# Patient Record
Sex: Male | Born: 1940
Health system: Southern US, Community
[De-identification: ages and names within clinical notes are randomized; demographics above are authoritative.]

## PROBLEM LIST (undated history)

## (undated) DIAGNOSIS — I639 Cerebral infarction, unspecified: Secondary | ICD-10-CM

## (undated) DIAGNOSIS — I10 Essential (primary) hypertension: Secondary | ICD-10-CM

## (undated) DIAGNOSIS — H9190 Unspecified hearing loss, unspecified ear: Secondary | ICD-10-CM

## (undated) DIAGNOSIS — Z972 Presence of dental prosthetic device (complete) (partial): Secondary | ICD-10-CM

## (undated) DIAGNOSIS — I679 Cerebrovascular disease, unspecified: Secondary | ICD-10-CM

## (undated) DIAGNOSIS — IMO0001 Reserved for inherently not codable concepts without codable children: Secondary | ICD-10-CM

## (undated) DIAGNOSIS — R011 Cardiac murmur, unspecified: Secondary | ICD-10-CM

## (undated) DIAGNOSIS — E785 Hyperlipidemia, unspecified: Secondary | ICD-10-CM

## (undated) DIAGNOSIS — K4020 Bilateral inguinal hernia, without obstruction or gangrene, not specified as recurrent: Secondary | ICD-10-CM

## (undated) DIAGNOSIS — N442 Benign cyst of testis: Secondary | ICD-10-CM

## (undated) DIAGNOSIS — D333 Benign neoplasm of cranial nerves: Secondary | ICD-10-CM

## (undated) DIAGNOSIS — I350 Nonrheumatic aortic (valve) stenosis: Secondary | ICD-10-CM

## (undated) DIAGNOSIS — R350 Frequency of micturition: Secondary | ICD-10-CM

## (undated) DIAGNOSIS — C61 Malignant neoplasm of prostate: Secondary | ICD-10-CM

## (undated) DIAGNOSIS — K458 Other specified abdominal hernia without obstruction or gangrene: Secondary | ICD-10-CM

## (undated) DIAGNOSIS — I700 Atherosclerosis of aorta: Secondary | ICD-10-CM

## (undated) DIAGNOSIS — K56609 Unspecified intestinal obstruction, unspecified as to partial versus complete obstruction: Secondary | ICD-10-CM

## (undated) DIAGNOSIS — K5790 Diverticulosis of intestine, part unspecified, without perforation or abscess without bleeding: Secondary | ICD-10-CM

## (undated) DIAGNOSIS — Z974 Presence of external hearing-aid: Secondary | ICD-10-CM

## (undated) DIAGNOSIS — K432 Incisional hernia without obstruction or gangrene: Secondary | ICD-10-CM

## (undated) DIAGNOSIS — K4190 Unilateral femoral hernia, without obstruction or gangrene, not specified as recurrent: Secondary | ICD-10-CM

## (undated) DIAGNOSIS — I739 Peripheral vascular disease, unspecified: Secondary | ICD-10-CM

## (undated) DIAGNOSIS — Q631 Lobulated, fused and horseshoe kidney: Secondary | ICD-10-CM

## (undated) DIAGNOSIS — I454 Nonspecific intraventricular block: Secondary | ICD-10-CM

## (undated) DIAGNOSIS — C4491 Basal cell carcinoma of skin, unspecified: Secondary | ICD-10-CM

## (undated) DIAGNOSIS — IMO0002 Reserved for concepts with insufficient information to code with codable children: Secondary | ICD-10-CM

## (undated) DIAGNOSIS — Z973 Presence of spectacles and contact lenses: Secondary | ICD-10-CM

## (undated) HISTORY — PX: PROSTATE BIOPSY: SHX241

## (undated) HISTORY — PX: COLONOSCOPY: SHX174

---

## 1977-11-30 DIAGNOSIS — I639 Cerebral infarction, unspecified: Secondary | ICD-10-CM

## 1977-11-30 HISTORY — DX: Cerebral infarction, unspecified: I63.9

## 1996-11-30 HISTORY — PX: TESTICLE SURGERY: SHX794

## 1998-06-04 ENCOUNTER — Ambulatory Visit (HOSPITAL_COMMUNITY): Admission: RE | Admit: 1998-06-04 | Discharge: 1998-06-04 | Payer: Self-pay | Admitting: Urology

## 1999-01-29 HISTORY — PX: COLECTOMY WITH COLOSTOMY CREATION/HARTMANN PROCEDURE: SHX6598

## 1999-02-17 ENCOUNTER — Encounter: Payer: Self-pay | Admitting: Emergency Medicine

## 1999-02-17 ENCOUNTER — Encounter: Payer: Self-pay | Admitting: Internal Medicine

## 1999-02-17 ENCOUNTER — Inpatient Hospital Stay (HOSPITAL_COMMUNITY): Admission: EM | Admit: 1999-02-17 | Discharge: 1999-02-28 | Payer: Self-pay | Admitting: Emergency Medicine

## 1999-03-01 HISTORY — PX: SMALL BOWEL REPAIR: SHX6447

## 1999-03-08 ENCOUNTER — Encounter: Payer: Self-pay | Admitting: General Surgery

## 1999-03-08 ENCOUNTER — Inpatient Hospital Stay (HOSPITAL_COMMUNITY): Admission: EM | Admit: 1999-03-08 | Discharge: 1999-04-01 | Payer: Self-pay | Admitting: Emergency Medicine

## 1999-03-09 ENCOUNTER — Encounter: Payer: Self-pay | Admitting: General Surgery

## 1999-03-26 ENCOUNTER — Encounter: Payer: Self-pay | Admitting: General Surgery

## 1999-08-12 ENCOUNTER — Encounter: Payer: Self-pay | Admitting: General Surgery

## 1999-08-12 ENCOUNTER — Ambulatory Visit (HOSPITAL_COMMUNITY): Admission: RE | Admit: 1999-08-12 | Discharge: 1999-08-12 | Payer: Self-pay | Admitting: General Surgery

## 1999-08-25 ENCOUNTER — Encounter (INDEPENDENT_AMBULATORY_CARE_PROVIDER_SITE_OTHER): Payer: Self-pay

## 1999-08-25 ENCOUNTER — Inpatient Hospital Stay (HOSPITAL_COMMUNITY): Admission: RE | Admit: 1999-08-25 | Discharge: 1999-08-29 | Payer: Self-pay | Admitting: General Surgery

## 1999-08-31 HISTORY — PX: COLOSTOMY TAKEDOWN: SHX5783

## 1999-12-01 HISTORY — PX: INCISIONAL HERNIA REPAIR: SHX193

## 2000-01-02 ENCOUNTER — Encounter: Payer: Self-pay | Admitting: General Surgery

## 2000-01-06 ENCOUNTER — Inpatient Hospital Stay (HOSPITAL_COMMUNITY): Admission: RE | Admit: 2000-01-06 | Discharge: 2000-01-09 | Payer: Self-pay | Admitting: General Surgery

## 2000-04-27 ENCOUNTER — Encounter (INDEPENDENT_AMBULATORY_CARE_PROVIDER_SITE_OTHER): Payer: Self-pay | Admitting: *Deleted

## 2000-04-27 ENCOUNTER — Ambulatory Visit (HOSPITAL_COMMUNITY): Admission: RE | Admit: 2000-04-27 | Discharge: 2000-04-27 | Payer: Self-pay | Admitting: General Surgery

## 2001-09-14 ENCOUNTER — Other Ambulatory Visit: Admission: RE | Admit: 2001-09-14 | Discharge: 2001-09-14 | Payer: Self-pay | Admitting: Urology

## 2001-09-14 ENCOUNTER — Encounter (INDEPENDENT_AMBULATORY_CARE_PROVIDER_SITE_OTHER): Payer: Self-pay | Admitting: Specialist

## 2001-09-22 ENCOUNTER — Ambulatory Visit: Admission: RE | Admit: 2001-09-22 | Discharge: 2001-12-21 | Payer: Self-pay | Admitting: Radiation Oncology

## 2001-12-22 ENCOUNTER — Ambulatory Visit: Admission: RE | Admit: 2001-12-22 | Discharge: 2002-03-22 | Payer: Self-pay | Admitting: Radiation Oncology

## 2002-11-18 ENCOUNTER — Encounter: Payer: Self-pay | Admitting: Emergency Medicine

## 2002-11-19 ENCOUNTER — Inpatient Hospital Stay (HOSPITAL_COMMUNITY): Admission: EM | Admit: 2002-11-19 | Discharge: 2002-11-20 | Payer: Self-pay | Admitting: Emergency Medicine

## 2006-11-30 HISTORY — PX: LAPAROSCOPIC INCISIONAL / UMBILICAL / VENTRAL HERNIA REPAIR: SUR789

## 2007-06-28 ENCOUNTER — Encounter: Admission: RE | Admit: 2007-06-28 | Discharge: 2007-06-28 | Payer: Self-pay | Admitting: *Deleted

## 2007-09-02 ENCOUNTER — Ambulatory Visit (HOSPITAL_COMMUNITY): Admission: RE | Admit: 2007-09-02 | Discharge: 2007-09-03 | Payer: Self-pay | Admitting: Surgery

## 2009-11-30 HISTORY — PX: REMOVE SUTURE UNDER ANESTHESIA: SUR1139

## 2010-03-25 ENCOUNTER — Ambulatory Visit (HOSPITAL_COMMUNITY): Admission: RE | Admit: 2010-03-25 | Discharge: 2010-03-25 | Payer: Self-pay | Admitting: Surgery

## 2010-11-30 DIAGNOSIS — I350 Nonrheumatic aortic (valve) stenosis: Secondary | ICD-10-CM

## 2010-11-30 HISTORY — DX: Nonrheumatic aortic (valve) stenosis: I35.0

## 2011-02-17 LAB — TISSUE CULTURE: Culture: NO GROWTH

## 2011-02-17 LAB — CBC
HCT: 41.2 % (ref 39.0–52.0)
MCHC: 33.8 g/dL (ref 30.0–36.0)
MCV: 93.8 fL (ref 78.0–100.0)
Platelets: 213 10*3/uL (ref 150–400)
RDW: 13.3 % (ref 11.5–15.5)

## 2011-02-17 LAB — ANAEROBIC CULTURE

## 2011-04-14 NOTE — Op Note (Signed)
NAME:  Matthew Hodges, Matthew Hodges NO.:  [id number]   MEDICAL RECORD NO.:  [id number]          PATIENT TYPE:  AMB   LOCATION:  DAY                          FACILITY:  [facility]   PHYSICIAN:  Ardeth Sportsman, MD     DATE OF BIRTH:  12/13/1940   DATE OF PROCEDURE:  09/02/2007  DATE OF DISCHARGE:                               OPERATIVE REPORT   PRIMARY CARE PHYSICIAN:  Bess Kinds, M.D.   SURGEON:  Karie Soda, M.D.   ASSISTANT:  Angelia Mould. Derrell Lolling, M.D., as well as Norval Gable, P.A.  student.   PREOPERATIVE DIAGNOSIS:  History of prior abdominal surgeries and  ventral hernia with paracolostomy hernia, possible midline ventral  hernia.   POSTOPERATIVE DIAGNOSES:  1. Left lower quadrant incarcerated ventral hernia from old colostomy      site, 9 x 14 cm in size.  2. Extensive abdominal wall adhesions to anterior abdominal wall.  3. No evidence of any significant ventral hernia.   PROCEDURE PERFORMED:  1. Laparoscopic lysis of adhesions x150 minutes (equals 75%).  2. Laparoscopic ventral hernia repair with 20 x 30 cm      Parietex/Seprafilm dual-sided mesh.   ANESTHESIA:  1. General anesthesia.  2. Local anesthetic in a field block around all port sites.   SPECIMENS:  None.   DRAINS:  None.   ESTIMATED BLOOD LOSS:  Less than 10 mL.   COMPLICATIONS:  None apparent.   INDICATIONS:  Matthew Hodges is a pleasant 70 year old gentleman who has had  numerous abdominal surgeries including a colectomy for perforated  diverticulitis with colostomy takedown.  He has also had ventral hernias  that have undergone open repair.  He developed MRSA stitch abscess in  his colostomy site.   He developed worsening pain and an obvious hernia through his colostomy  site and some tenderness along his midline, concern of possible ventral  hernia as well.  The anatomy and physiologic of abdominal wall formation  was discussed.  Pathophysiology of herniation with abdominal wall with  its  risks, incarceration, strangulation, debilitating pain and decreased  quality of life natural history was discussed.  Options were discussed  and recommendations made for laparoscopic possible open lysis of  adhesions with ventral hernia repair around the old colostomy site and  possibly on the midline as well.  Risks such as stroke, heart attack,  deep venous thrombosis, pulmonary embolism and death were discussed.  Risks such as bleeding, need for transfusion, wound infection, abscess,  injury to organs, prolonged pain, recurrent hernia, stitch abscess or  injury to other organs, bowel leak, hernia recurrence, enterocutaneous  fistula, numerous other risks were discussed.  Questions answered, and  he and his family agreed to proceed.   OPERATIVE FINDINGS:  He had very dense adhesions onto his midline  involving numerous loops of small intestine and omentum.  He had  numerous adhesions on his left lateral abdomen as well of omentum along  with incarcerated hernia through his colostomy site about 9 x 14 cm in  size with omentum and small intestine within it.  He had no strong  evidence of a midline incisional hernia with prior mesh intact.   DESCRIPTION OF PROCEDURE:  Informed consent was confirmed.  The patient  received IV vancomycin just prior to surgery,  given this history of  MRSA in the past, though he had recently been infection free for over 6  weeks.  He had sequential compression devices active during the entire  case.  He was positioned supine, both arms tucked.  He underwent general  anesthesia without any difficulty.  A Foley catheter was sterilely  placed.  His abdomen was clipped, prepped, and draped in a sterile  fashion.   Entry was gained in the abdomen with the patient in steep reverse  Trendelenburg and right-side up, and placement of 5-mm port through in  the right upper quadrant through a stab incision using optical technique  with 5-mm 0-degree scope.   Capnoperitoneum to 15 mmHg provided good  abdominal insufflation.  Later on in the case, we reduced it down to 10  mm during the period of placement of the mesh.  Under visualization, 5-  mm ports were placed in the right flank and right lower quadrant.  Later  when there was enough room, we were able to place the final port in the  periumbilical region, a 5-mm port in the suprapubic region after  upgrading the right flank port to a 10-mm port.   Dense adhesion of the anterior abdominal wall were found as noted above.  These were freed off with using cold scissors and no cautery.  I did not  use any harmonic dissection.  I used only very minimal harmonic  dissection with the omentum only.  This part of the procedure took 2-1/2  hours.  There were some interloop adhesions that were freed off as well.  He had some dense adhesions of his greater omentum in this left upper  quadrant, and these were freed off to help mobilize greater omentum at  the case as well.  Careful inspection was made, and there was no serosal  tear.  There was no enterotomy.  There was no significant bleeding at  all.   After freeing especially in the midline, we came to the hernia and his  old colostomy site.  It was incarcerated with greater omentum and small  bowel.  Ultimately, we were able to reduce this and free it off.  The  hernia defect was measured.  Given his prior surgeries and morbid  obesity and thin abdominal wall, we erred on the side of placing a 20 x  30-cm Parietex dual-side mesh.  Alternating #1 Novofil and Ethibond  stitches were placed circumferentially around the mesh for a total of 10  stitches, sparing one corner.  The mesh was rolled rough side in, placed  through the right flank defect, unrolled and secured to the anterior  abdominal wall using laparoscopic suture passer.  The lateral inferior  corner that overlaid over his anterior superior iliac spine or his hip I  tried to secure using  Mitek tacks x2.  However, the tacks would not get  good purchase in the periosteum, and drill was not available to have a  better drill to complete deeper tacks.  Therefore, after tacking the  mesh on the anterior abdominal wall under direct visualization, we  placed a #1 Ethibond stitch x2 over the ridge of the hip bone near the  anterior superior spine and a little more laterally to help secure the  mesh in that corner with fascial stitches.  A Protac was used to  circumferentially tack the edges of the mesh to the anterior abdominal  wall as well as a few tacks around the hernia edge as well.  Attempts  were made to free off hernia sac as well, so there would be no  persistent sack in the region.  Camera inspection revealed excellent  coverage.  It was over 2-inches circumferential coverage around the  mesh; I would say actually almost at least 3 inches circumferential  coverage.  The mesh had been placed in a transverse fashion from the  flank and actually crossed over the midline slightly as well.   Again, inspection was made with no evidence of enterotomy or bleeding.  Omentum was able be brought down and covered up where the rough end of  the mesh had been performed.  Adhesions had been freed basically from  ascending colon to descending colon laterally and from falciform  ligament all the way down to the pelvis, so there was no significant  abdominal adhesions left.  There was no significant interloop adhesions  and no evidence of any bowel obstruction.  .  The 10-mm port fascial  defect was closed using a 0 Ethibond stitch using a laparoscopic suture  passer to good result.  Capnoperitoneum was evacuated.  Ports were  removed.  Port sites were closed using 4-0 Monocryl stitch, and sterile  dressings were applied.  Small stab incision sites were closed using  Dermabond to good result.  A binder was gently placed around the area.  The patient was extubated and taken to the recovery  room in stable  condition.   I explained the operative findings to the patient's wife and friend.  Postoperative instructions were discussed in detail.  Questions were  answered, and they expressed understanding and appreciation.      Ardeth Sportsman, MD  Electronically Signed     SCG/MEDQ  D:  09/02/2007  T:  09/03/2007  Job:  [id number]

## 2011-04-17 NOTE — Discharge Summary (Signed)
   NAME:  Matthew Hodges, Matthew Hodges NO.:  [id number]   MEDICAL RECORD NO.:  [id number]                   PATIENT TYPE:  INP   LOCATION:  0478                                 FACILITY:  [facility]   PHYSICIAN:  Lorre Munroe., M.D.            DATE OF BIRTH:  09-18-1941   DATE OF ADMISSION:  11/18/2002  DATE OF DISCHARGE:  11/20/2002                                 DISCHARGE SUMMARY   HISTORY OF PRESENT ILLNESS:  The patient is a 70 year old white male who has  been hospitalized for about 36 hours now for crampy abdominal pain,  vomiting, and small bowel obstruction by CT and abdominal x-rays.  Refer to  the history and physical for more details.  He has had numerous abdominal  operations, including a Hartmann procedure for diverticulitis, closure of  his colostomy, treatment of enterocutaneous fistula, other intra-abdominal  infection, treatment of wound infection, and repair of incisional hernia.   HOSPITAL COURSE:  The patient was started on nasogastric suction and  intravenous fluids and given supportive care.  He has remained afebrile with  slight tachycardia.  His white blood cell count was remained normal.  He has  continued, however, to have abdominal pain.  Repeat abdominal x-rays  yesterday and today continue to show high-grade small intestinal  obstruction.  The patient has not passed flatus.  He continues to have a  moderately tender abdomen.  At his request, arrangements are made for him to  be transferred to the [facility] for  treatment at that facility as he is an eligible veteran.  This is to be  accomplished today I hope.  The patient gives his permission for transfer.   DIAGNOSIS:  Small intestinal obstruction probably due to adhesions.   CONDITION ON DISCHARGE:  Stable, but unimproved.                                               Lorre Munroe., M.D.    WB/MEDQ  D:  11/20/2002  T:  11/20/2002  Job:   [id number]

## 2011-04-17 NOTE — H&P (Signed)
NAME:  Matthew Hodges, Matthew Hodges NO.:  [id number]   MEDICAL RECORD NO.:  [id number]                   PATIENT TYPE:  INP   LOCATION:  0478                                 FACILITY:  [facility]   PHYSICIAN:  Lorre Munroe., M.D.            DATE OF BIRTH:  1941-02-23   DATE OF ADMISSION:  11/18/2002  DATE OF DISCHARGE:                                HISTORY & PHYSICAL   CHIEF COMPLAINT:  Abdominal pain.   HISTORY OF PRESENT ILLNESS:  The patient is a 70 year old white male who is  status post multiple abdominal operations beginning in 2000.  The first was  a Hartman's procedure for perforated diverticulitis.  This was complicated  by an enterocutaneous fissula, and he has had intra-abdominal abscesses and  wound infections, and has undergone multiple operative procedures.  He has  had his colostomy taken down and his bowels now move well.  He has had mesh  repair of incisional hernia.  On the day of admission, the patient began to  have rather severe crampy abdominal pain and he vomited once.  He came to  the emergency department where a CT scan showed a picture consistent with  small intestinal obstruction with some edema of the mesentery, dilation of  proximal bowel, and collapse of distal bowel.  He is admitted to the  hospital for treatment of small intestinal obstruction probably due to  adhesions.   PAST MEDICAL HISTORY:  1. The patient's general health is pretty good.  He has high blood pressure,     and is on Vasotec.  2. He also is on Zocor for hyperlipidemia.  3. He takes a baby aspirin daily and vitamin tablets.   SOCIAL HISTORY:  He tends to drink about three to four alcoholic beverages  daily, but does not smoke.   ALLERGIES:  No known drug allergies.   PAST SURGICAL HISTORY:  1. Excision of a skin cancer of the scalp which was not a melanoma, but has     not had any other major operations.  2. He was hospitalized for an intracranial  hemorrhage in 1979, but does not     have any disability on account of that.   The review of systems, family history, and childhood illnesses are  unremarkable beyond the above.   PHYSICAL EXAMINATION:  VITAL SIGNS:  Temperature and vital signs as recorded  by nursing staff.  MENTAL STATUS:  Normal.  GENERAL:  The patient was in some pain, but not acute distress.  HEENT:  Unremarkable, except for a well-healed scar of the scalp at the site  of excision of skin tumor.  NECK:  Unremarkable.  CHEST:  Clear to auscultation.  HEART:  Regular rate and rhythm.  No murmur or gallop.  ABDOMEN:  Moderate distention, slight diffuse tenderness.  Hyperactive bowel  sounds.  No mass or organomegaly.  There is some weakness in the upper part  of the midline incision, but no definite hernia.  RECTAL:  Unremarkable.  EXTREMITIES:  Normal.  LYMPH NODES:  Not enlarged.  SKIN:  No lesions noted at this time.  NEUROLOGIC:  Grossly normal.   IMPRESSION:  1. Small intestinal obstruction.  2. Hypertension.  3. Hyperlipidemia.   PLAN:  Admission for supportive care and possible surgery.                                                Lorre Munroe., M.D.    WB/MEDQ  D:  11/20/2002  T:  11/20/2002  Job:  [id number]

## 2011-04-17 NOTE — Op Note (Signed)
. [facility]  Patient:    Matthew Hodges, Matthew Hodges                      MRN: [id number] Proc. Date: 04/27/00 Adm. Date:  [id number] Disc. Date: [id number] Attending:  Glenna Fellows Tappan                           Operative Report  PREOPERATIVE DIAGNOSIS:  Squamous cell cancer of the skin, anterior scalp.  POSTOPERATIVE DIAGNOSIS:  Squamous cell cancer of the skin, anterior scalp.  SURGICAL PROCEDURE:  Excision of squamous cell carcinoma of the scalp with split-thickness skin graft.  SURGEON:  Lorne Skeens. Hoxworth, M.D.  ANESTHESIA:  General.  BRIEF HISTORY:  Mr. Ewart is a 70 year old white male, who presents with an ulcerating skin lesion on his anterior scalp.  His dermatologist has biopsied it, showing a basal squamous cell cancer.  Examination reveals a 3 x 3.5 cm ulcerated lesion on his anterior left scalp.  Excision with split-thickness skin graft closure has been recommended and accepted. Ashby Dawes of the procedure, its indications and risks of bleeding, infection  were discussed and understood preoperatively.  He is now brought to the operating room for this procedure.  DESCRIPTION OF PROCEDURE:  Patient brought to the operating room, placed in the supine position on the operating table and general endotracheal anesthesia was induced.  The scalp and right abdominal flank were sterilely prepped and draped.  The lesion was then sharply excised with grossly several mm margins in all directions and taken down removing the subcu and leaving the fascia intact.  Hemostasis was obtained with cautery.  Following this, the Zimmer dermatome was used to harvest a 16/1000th inch split-thickness skin graft from the right flank.  The graft was harvested nicely with punctate bleeding from the donor site.  The graft was meshed a 1.5:1 and laid over the scalp wound with complete coverage.  The graft was sutured into place with 3-0 silk and a dressing of  nonadherent dressing, Bactroban and cotton balls were placed and secured with the previously placed sutures.  An OpSite was placed over the donor site.  Sponge, needle and instrument counts were correct. Patient taken to recovery in good condition. DD:  04/27/00 TD:  04/28/00 Job: [id number]

## 2011-09-10 LAB — HEMOGLOBIN AND HEMATOCRIT, BLOOD
HCT: 39.8
Hemoglobin: 13.9

## 2011-09-10 LAB — BASIC METABOLIC PANEL
BUN: 16
Calcium: 9
Creatinine, Ser: 1.1
GFR calc Af Amer: >60
GFR calc non Af Amer: >60

## 2011-10-16 ENCOUNTER — Inpatient Hospital Stay (HOSPITAL_COMMUNITY)
Admission: EM | Admit: 2011-10-16 | Discharge: 2011-10-18 | DRG: 389 | Disposition: A | Discharge status: Home / Self Care | Payer: Medicare Other | Attending: Surgery | Admitting: Surgery

## 2011-10-16 ENCOUNTER — Encounter: Payer: Self-pay | Admitting: *Deleted

## 2011-10-16 ENCOUNTER — Emergency Department (HOSPITAL_COMMUNITY): Payer: Medicare Other

## 2011-10-16 DIAGNOSIS — I998 Other disorder of circulatory system: Secondary | ICD-10-CM | POA: Diagnosis present

## 2011-10-16 DIAGNOSIS — R109 Unspecified abdominal pain: Secondary | ICD-10-CM

## 2011-10-16 DIAGNOSIS — Z7982 Long term (current) use of aspirin: Secondary | ICD-10-CM

## 2011-10-16 DIAGNOSIS — I700 Atherosclerosis of aorta: Secondary | ICD-10-CM | POA: Diagnosis present

## 2011-10-16 DIAGNOSIS — I672 Cerebral atherosclerosis: Secondary | ICD-10-CM | POA: Diagnosis present

## 2011-10-16 DIAGNOSIS — K5730 Diverticulosis of large intestine without perforation or abscess without bleeding: Secondary | ICD-10-CM | POA: Diagnosis present

## 2011-10-16 DIAGNOSIS — Z91040 Latex allergy status: Secondary | ICD-10-CM

## 2011-10-16 DIAGNOSIS — K56609 Unspecified intestinal obstruction, unspecified as to partial versus complete obstruction: Principal | ICD-10-CM | POA: Diagnosis present

## 2011-10-16 DIAGNOSIS — J9819 Other pulmonary collapse: Secondary | ICD-10-CM | POA: Diagnosis present

## 2011-10-16 DIAGNOSIS — Q251 Coarctation of aorta: Secondary | ICD-10-CM

## 2011-10-16 DIAGNOSIS — I10 Essential (primary) hypertension: Secondary | ICD-10-CM | POA: Diagnosis present

## 2011-10-16 DIAGNOSIS — Z8546 Personal history of malignant neoplasm of prostate: Secondary | ICD-10-CM

## 2011-10-16 DIAGNOSIS — E785 Hyperlipidemia, unspecified: Secondary | ICD-10-CM | POA: Diagnosis present

## 2011-10-16 DIAGNOSIS — K7689 Other specified diseases of liver: Secondary | ICD-10-CM | POA: Diagnosis present

## 2011-10-16 HISTORY — DX: Cardiac murmur, unspecified: R01.1

## 2011-10-16 HISTORY — DX: Cerebrovascular disease, unspecified: I67.9

## 2011-10-16 HISTORY — DX: Hyperlipidemia, unspecified: E78.5

## 2011-10-16 HISTORY — DX: Diverticulosis of intestine, part unspecified, without perforation or abscess without bleeding: K57.90

## 2011-10-16 HISTORY — DX: Cerebral infarction, unspecified: I63.9

## 2011-10-16 HISTORY — DX: Essential (primary) hypertension: I10

## 2011-10-16 LAB — URINALYSIS, ROUTINE W REFLEX MICROSCOPIC
Hgb urine dipstick: NEGATIVE
Ketones, ur: 15 mg/dL — AB
Leukocytes, UA: NEGATIVE
Protein, ur: NEGATIVE mg/dL
Urobilinogen, UA: 0.2 mg/dL (ref 0.0–1.0)

## 2011-10-16 LAB — POCT I-STAT, CHEM 8
Chloride: 104 mEq/L (ref 96–112)
Glucose, Bld: 173 mg/dL — ABNORMAL HIGH (ref 70–99)
HCT: 49 % (ref 39.0–52.0)
Hemoglobin: 16.7 g/dL (ref 13.0–17.0)
Potassium: 4.1 mEq/L (ref 3.5–5.1)
Sodium: 140 mEq/L (ref 135–145)

## 2011-10-16 LAB — CREATININE, SERUM: Creatinine, Ser: 0.8 mg/dL (ref 0.50–1.35)

## 2011-10-16 LAB — CBC
Hemoglobin: 14 g/dL (ref 13.0–17.0)
MCH: 31.7 pg (ref 26.0–34.0)
MCH: 31.2 pg (ref 26.0–34.0)
MCHC: 35 g/dL (ref 30.0–36.0)
MCHC: 34.2 g/dL (ref 30.0–36.0)
RDW: 12.8 % (ref 11.5–15.5)
RDW: 13.1 % (ref 11.5–15.5)

## 2011-10-16 MED ORDER — IOHEXOL 300 MG/ML  SOLN
100.0000 mL | Freq: Once | INTRAMUSCULAR | Status: AC | PRN
  Administered 2011-10-16: 100 mL via INTRAVENOUS

## 2011-10-16 MED ORDER — PANTOPRAZOLE SODIUM 40 MG IV SOLR
40.0000 mg | Freq: Every day | INTRAVENOUS | Status: DC
  Administered 2011-10-16 – 2011-10-17 (×2): 40 mg via INTRAVENOUS
  Filled 2011-10-16 (×3): qty 40

## 2011-10-16 MED ORDER — ACETAMINOPHEN 650 MG RE SUPP: 650.0000 mg | Freq: Four times a day (QID) | RECTAL | Status: DC | PRN

## 2011-10-16 MED ORDER — ACETAMINOPHEN 325 MG PO TABS: 650.0000 mg | ORAL_TABLET | Freq: Four times a day (QID) | ORAL | Status: DC | PRN

## 2011-10-16 MED ORDER — ONDANSETRON HCL 4 MG/2ML IJ SOLN
4.0000 mg | Freq: Once | INTRAMUSCULAR | Status: AC
  Administered 2011-10-16: 4 mg via INTRAVASCULAR
  Filled 2011-10-16: qty 2

## 2011-10-16 MED ORDER — SODIUM CHLORIDE 0.9 % IV SOLN
INTRAVENOUS | Status: DC
  Administered 2011-10-17 (×2): via INTRAVENOUS

## 2011-10-16 MED ORDER — FENTANYL CITRATE 0.05 MG/ML IJ SOLN
INTRAMUSCULAR | Status: AC
  Administered 2011-10-16: 100 ug
  Filled 2011-10-16: qty 2

## 2011-10-16 MED ORDER — ONDANSETRON HCL 4 MG/2ML IJ SOLN: 4.0000 mg | Freq: Four times a day (QID) | INTRAMUSCULAR | Status: DC | PRN

## 2011-10-16 MED ORDER — LIDOCAINE VISCOUS 2 % MT SOLN
OROMUCOSAL | Status: AC
  Administered 2011-10-16: 07:00:00
  Filled 2011-10-16: qty 15

## 2011-10-16 MED ORDER — MORPHINE SULFATE 2 MG/ML IJ SOLN
2.0000 mg | INTRAMUSCULAR | Status: DC | PRN
  Administered 2011-10-16: 2 mg via INTRAVENOUS
  Filled 2011-10-16: qty 1

## 2011-10-16 MED ORDER — DIPHENHYDRAMINE HCL 50 MG/ML IJ SOLN
25.0000 mg | Freq: Once | INTRAMUSCULAR | Status: AC
  Administered 2011-10-16: 25 mg via INTRAVENOUS
  Filled 2011-10-16: qty 1

## 2011-10-16 MED ORDER — FENTANYL CITRATE 0.05 MG/ML IJ SOLN
100.0000 ug | Freq: Once | INTRAMUSCULAR | Status: AC
  Administered 2011-10-16: 100 ug via INTRAVENOUS
  Filled 2011-10-16: qty 2

## 2011-10-16 MED ORDER — HEPARIN SODIUM (PORCINE) 5000 UNIT/ML IJ SOLN
5000.0000 [IU] | Freq: Three times a day (TID) | INTRAMUSCULAR | Status: DC
  Administered 2011-10-16 – 2011-10-18 (×6): 5000 [IU] via SUBCUTANEOUS
  Filled 2011-10-16 (×10): qty 1

## 2011-10-16 MED ORDER — ONDANSETRON HCL 4 MG/2ML IJ SOLN
INTRAMUSCULAR | Status: AC
  Administered 2011-10-16: 4 mg via INTRAVASCULAR
  Filled 2011-10-16: qty 2

## 2011-10-16 NOTE — ED Notes (Signed)
Awaiting inpatient bed assignment.

## 2011-10-16 NOTE — ED Notes (Signed)
Admitting MD at bedside.

## 2011-10-16 NOTE — ED Notes (Signed)
Attempted to call report, nurse unavailable.

## 2011-10-16 NOTE — ED Notes (Signed)
Vital signs stable. 

## 2011-10-16 NOTE — H&P (Signed)
Matthew Hodges is an 70 y.o. male.   Chief Complaint: abdominal pain  Referred by Dr. Nicanor Alcon HPI:  This is a 70 year old male with an extensive past surgical history. He had initially a sigmoid colectomy and Hartman's procedure a number of years ago by one of my partners Dr. Johna Sheriff. He states that he may have been taken back to the operating room by one of my other partners but does not remember what that was for. Six  months after his sigmoid colectomy for diverticular disease he underwent a colostomy takedown. Since then he has had a number of hernia repairs both here and at the [place]. These have been done with mesh. His last was a laparoscopic ventral hernia repair by Dr. Michaell Cowing several years ago. 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 Michaell Cowing as well. He also has a history of a lysis of adhesions at the [facility] hospital. He is done fairly well until about 9 PM last night when he had fairly quick onset of some diffuse abdominal pain. This is associated with some nausea and a small amount of emesis which she describes as mucus. He did pass some flatus on the way to the hospital. His last bowel movement was at 11 PM. He dinner well. He denies any fevers. He also denies any shortness of breath or symptoms of claudication.  Past Medical History  Diagnosis Date  . Cerebral artery disease   . Hypertension   . Diverticular disease   . Hyperlipidemia   . Cancer     history prostate cancer treated with radiation    Past Surgical History  Procedure Date  . Colon surgery     Hartmans procedure with sigmoid colectomy and subsequent  colostomy takedown  . Hernia repair     multiple ventral hernia repairs with mesh last [facility]  . Enterolysis     History reviewed. No pertinent family history. Social History:  reports that he has never smoked. He does not have any smokeless tobacco history on file. He reports that he does not drink alcohol. His drug history not  on file.  Allergies:  Allergies  Allergen Reactions  . Latex     Medications Prior to Admission  Medication Dose Route Frequency Provider Last Rate Last Dose  . fentaNYL (SUBLIMAZE) 0.05 MG/ML injection        100 mcg at 10/16/11 0618  . fentaNYL (SUBLIMAZE) injection 100 mcg  100 mcg Intravenous Once April K Palumbo-Rasch, MD   100 mcg at 10/16/11 0304  . iohexol (OMNIPAQUE) 300 MG/ML injection 100 mL  100 mL Intravenous Once PRN Medication Radiologist   100 mL at 10/16/11 0459  . ondansetron (ZOFRAN) 4 MG/2ML injection        4 mg at 10/16/11 0442  . ondansetron (ZOFRAN) injection 4 mg  4 mg Intravenous Once April Smitty Cords, MD       No current outpatient prescriptions on file as of 10/16/2011.    Results for orders placed during the hospital encounter of 10/16/11 (from the past 48 hour(s))  CBC     Status: Abnormal   Collection Time   10/16/11  2:43 AM      Component Value Range Comment   WBC 14.4 (*) 4.0 - 10.5 (K/uL)    RBC 4.60  4.22 - 5.81 (MIL/uL)    Hemoglobin 14.6  13.0 - 17.0 (g/dL)    HCT 16.1  09.6 - 04.5 (%)  MCV 90.7  78.0 - 100.0 (fL)    MCH 31.7  26.0 - 34.0 (pg)    MCHC 35.0  30.0 - 36.0 (g/dL)    RDW 08.6  57.8 - 46.9 (%)    Platelets 219  150 - 400 (K/uL)   POCT I-STAT, CHEM 8     Status: Abnormal   Collection Time   10/16/11  3:29 AM      Component Value Range Comment   Sodium 140  135 - 145 (mEq/L)    Potassium 4.1  3.5 - 5.1 (mEq/L)    Chloride 104  96 - 112 (mEq/L)    BUN 19  6 - 23 (mg/dL)    Creatinine, Ser 6.29  0.50 - 1.35 (mg/dL)    Glucose, Bld 528 (*) 70 - 99 (mg/dL)    Calcium, Ion 4.13  1.12 - 1.32 (mmol/L)    TCO2 25  0 - 100 (mmol/L)    Hemoglobin 16.7  13.0 - 17.0 (g/dL)    HCT 24.4  01.0 - 27.2 (%)   URINALYSIS, ROUTINE W REFLEX MICROSCOPIC     Status: Abnormal   Collection Time   10/16/11  4:29 AM      Component Value Range Comment   Color, Urine YELLOW  YELLOW     Appearance CLOUDY (*) CLEAR     Specific Gravity,  Urine 1.028  1.005 - 1.030     pH 5.5  5.0 - 8.0     Glucose, UA NEGATIVE  NEGATIVE (mg/dL)    Hgb urine dipstick NEGATIVE  NEGATIVE     Bilirubin Urine NEGATIVE  NEGATIVE     Ketones, ur 15 (*) NEGATIVE (mg/dL)    Protein, ur NEGATIVE  NEGATIVE (mg/dL)    Urobilinogen, UA 0.2  0.0 - 1.0 (mg/dL)    Nitrite NEGATIVE  NEGATIVE     Leukocytes, UA NEGATIVE  NEGATIVE  MICROSCOPIC NOT DONE ON URINES WITH NEGATIVE PROTEIN, BLOOD, LEUKOCYTES, NITRITE, OR GLUCOSE <1000 mg/dL.   Ct Abdomen Pelvis W Contrast  10/16/2011  *RADIOLOGY REPORT*  Clinical Data: Intense lower abdominal pain.  CT ABDOMEN AND PELVIS WITH CONTRAST  Technique:  Multidetector CT imaging of the abdomen and pelvis was performed following the standard protocol during bolus administration of intravenous contrast.  Contrast: OMNIPAQUE IOHEXOL 300 MG/ML IV SOLN  Comparison: CT of the abdomen and pelvis performed 06/28/2007, and abdominal radiograph performed earlier today at 03:12 a.m.  Findings: Mild bibasilar atelectasis is noted.  A minimal 5 mm hypodensity within the liver adjacent to the gallbladder fossa may reflect a small cyst, but is too small to characterize.  The liver is otherwise unremarkable in appearance. The spleen is normal in appearance.  The gallbladder is within normal limits.  The pancreas and adrenal glands are unremarkable.  A horseshoe kidney is again noted, with mildly increased surrounding perinephric stranding.  There is no evidence of hydronephrosis.  No renal or ureteral stones are seen.  There is distension of small bowel loops to 4.4 cm in maximal diameter at the lower abdomen, with mild fecalization noted just proximal to the transition point at the anterior right lower abdominal wall, likely reflecting an adhesion due to the patient's prior surgeries.  The distal small bowel is mostly decompressed. The transition point does not involve the anterior abdominal wall mesh on the left side.  The stomach is within  normal limits.  No acute vascular abnormalities are seen.  There is diffuse calcification along the abdominal aorta and its  branches, with severe luminal stenosis at the distal abdominal aorta due to extensive calcification.  The appendix is normal in caliber and unremarkable in appearance. The transverse colon extends along a broad-based herniation of fat along the anterior abdominal wall.  There is no evidence for incarceration or colonic obstruction.  Minimal diverticulosis is noted along the proximal sigmoid colon, without evidence of diverticulitis.  The bladder is decompressed and not well assessed.  The prostate remains normal in size.  Small bilateral inguinal hernias are noted, containing only fat.  No inguinal lymphadenopathy is seen.  No acute osseous abnormalities are identified.  Vacuum phenomenon is noted at L4-L5 and L5-S1.  IMPRESSION:  1.  Relatively high-grade small bowel obstruction, with distension of small bowel loops to 4.4 cm in maximal diameter, and a transition point at the anterior right lower abdominal wall likely reflecting an adhesion.  Distal small bowel is mostly decompressed. 2.  Note severe luminal stenosis at the distal abdominal aorta due to extensive calcification, significantly worsened from the prior CT; diffuse calcification along the abdominal aorta and its branches. 3.  Broad-based herniation of fat along the anterior abdominal wall, but the patient's abdominal wall mesh; the transverse colon extends into this herniation, without evidence for incarceration or colonic obstruction. 4.  Minimal diverticulosis along the proximal sigmoid colon, without evidence of diverticulitis. 5.  Small bilateral inguinal hernias, containing only fat. 6.  Likely small hepatic cyst. 7.  Mildly increased perinephric stranding about the patient's horseshoe kidney; horseshoe kidney otherwise unremarkable in appearance. 8.  Mild bibasilar atelectasis noted.  Findings were discussed with Dr. April  Palumbo-Rasch at 05:39 a.m. on 10/16/2011.  Original Report Authenticated By: Tonia Ghent, M.D.   Dg Abd Acute W/chest  10/16/2011  *RADIOLOGY REPORT*  Clinical Data: Abdominal pain and nausea.  ACUTE ABDOMEN SERIES (ABDOMEN 2 VIEW & CHEST 1 VIEW)  Comparison: Chest radiograph performed 03/25/2010, and CT of the abdomen and pelvis performed 06/28/2007  Findings: The lungs are well-aerated.  Mild focal left basilar airspace opacity may reflect atelectasis or pneumonia.  There is no evidence of pleural effusion or pneumothorax.  The cardiomediastinal silhouette is borderline normal in size; calcification is noted within the aortic arch.  The visualized bowel gas pattern is unremarkable.  Scattered stool and air are seen within the colon; there is no evidence of small bowel dilatation to suggest obstruction. A few air-fluid levels are noted within small and large bowel loops.  No free intra-abdominal air is identified on the provided upright view.  An abdominal wall mesh is noted at the left lower quadrant.  No acute osseous abnormalities are seen; the sacroiliac joints are unremarkable in appearance.  IMPRESSION:  1.  Unremarkable bowel gas pattern; no free intra-abdominal air seen. 2.  Focal left basilar airspace opacity may reflect atelectasis or pneumonia.  Original Report Authenticated By: Tonia Ghent, M.D.    Review of Systems  Constitutional: Negative for fever and chills.  HENT: Negative.   Eyes: Negative.   Respiratory: Negative for cough, shortness of breath and wheezing.   Cardiovascular: Negative for chest pain, palpitations, orthopnea, claudication and leg swelling.  Gastrointestinal: Positive for nausea and abdominal pain. Negative for heartburn, vomiting, diarrhea, constipation, blood in stool and melena.  Genitourinary: Negative for dysuria and urgency.  Neurological: Negative for weakness.    Blood pressure 146/67, pulse 82, temperature 98 F (36.7 C), temperature source Oral,  resp. rate 18, SpO2 95.00%. Physical Exam  Constitutional: He appears well-developed and well-nourished.  Eyes: No  scleral icterus.  Neck: Neck supple.  Cardiovascular: Normal rate, regular rhythm and normal heart sounds.   Respiratory: Effort normal and breath sounds normal. He has no wheezes. He has no rales.  GI: Normal appearance. Bowel sounds are decreased. There is no splenomegaly. There is tenderness (mild diffuse tenderness without peritonitis). There is no rebound. A hernia (difficult to palpate but appears to have on ct scan) is present.  Lymphadenopathy:    He has no cervical adenopathy.  Skin: He is not diaphoretic.     Assessment/Plan Partial SBO  He does appear to have a high-grade partial small bowel obstruction. Clinically this is a partial given the fact he is passing flatus. There were no indications that he needs to go to the operating room right now. We discussed conservative management. This would include IV fluids, nasogastric tube, and remain n.p.o. We discussed would repeat his films tomorrow. I told him that this may not work and he may require a trip to the operating room and would note that over the next several days. He is agreeable to this and we'll plan on admission.  He does have what looks to be some significant atherosclerotic disease of his aorta. He has not have any symptoms associated with this. In evaluating with some ABIs in the hospital as well.  Matthew Hodges 10/16/2011, 6:30 AM

## 2011-10-16 NOTE — ED Notes (Signed)
The pt has had abd pain since last pm with nausea no vomiting or diarrhea.  He has had in the past

## 2011-10-16 NOTE — ED Notes (Signed)
Pt states that he had a sudden onset of lower abdominal pain. Pt states that he has a history of GI issues including hernias, blockages, and diverticulitis. Pt states that the pain feels like a blockage. Pt admits to being able to pass a small amount of stool and gas since pain. Pt alert and oriented and able to move all extremities and follow commands. Pt getting undressed.

## 2011-10-16 NOTE — ED Provider Notes (Signed)
History     CSN: [id number] Arrival date & time: 10/16/2011  2:23 AM   First MD Initiated Contact with Patient 10/16/11 [phone]      Chief Complaint  Patient presents with  . Abdominal Pain    (Consider location/radiation/quality/duration/timing/severity/associated sxs/prior treatment) Patient is a 70 y.o. male presenting with abdominal pain. The history is provided by the patient. No language interpreter was used.  Abdominal Pain The primary symptoms of the illness include abdominal pain. The primary symptoms of the illness do not include fever, fatigue, shortness of breath, nausea, vomiting, diarrhea, hematemesis, hematochezia or dysuria. The current episode started 3 to 5 hours ago. The onset of the illness was sudden. The problem has been rapidly worsening.  Associated with: nothing. The patient has not had a change in bowel habit. Risk factors for an acute abdominal problem include being elderly. Symptoms associated with the illness do not include chills, anorexia, diaphoresis, heartburn, constipation, urgency, hematuria, frequency or back pain. Significant associated medical issues include diverticulitis. Significant associated medical issues do not include PUD, GERD, inflammatory bowel disease, diabetes, sickle cell disease, gallstones, liver disease, substance abuse or HIV.    Past Medical History  Diagnosis Date  . Diverticul disease small and large intestine, no perforati or abscess   . Cerebral artery disease   . Hypertension     History reviewed. No pertinent past surgical history.  History reviewed. No pertinent family history.  History  Substance Use Topics  . Smoking status: Never Smoker   . Smokeless tobacco: Not on file  . Alcohol Use: No      Review of Systems  Constitutional: Negative for fever, chills, diaphoresis and fatigue.  HENT: Negative for facial swelling and neck pain.   Respiratory: Negative for shortness of breath.   Cardiovascular: Negative for  chest pain.  Gastrointestinal: Positive for abdominal pain. Negative for heartburn, nausea, vomiting, diarrhea, constipation, hematochezia, abdominal distention, anal bleeding, anorexia and hematemesis.  Genitourinary: Negative for dysuria, urgency, frequency and hematuria.  Musculoskeletal: Negative for back pain.  Skin: Negative for color change.  Neurological: Negative for dizziness.  Hematological: Negative.   Psychiatric/Behavioral: Negative.     Allergies  Review of patient's allergies indicates no known allergies.  Home Medications  No current outpatient prescriptions on file.  BP 146/67  Pulse 82  Temp(Src) 98 F (36.7 C) (Oral)  Resp 18  SpO2 95%  Physical Exam  Constitutional: He is oriented to person, place, and time. He appears well-developed and well-nourished. No distress.  HENT:  Head: Normocephalic and atraumatic.  Mouth/Throat: Oropharynx is clear and moist. No oropharyngeal exudate.  Eyes: Conjunctivae and EOM are normal. Pupils are equal, round, and reactive to light.  Neck: Normal range of motion. Neck supple. No tracheal deviation present.  Cardiovascular: Normal rate and regular rhythm.  Exam reveals no friction rub.   Pulmonary/Chest: Effort normal and breath sounds normal.  Abdominal: Soft. Bowel sounds are normal. He exhibits no mass. There is tenderness. There is no rebound and no guarding.  Musculoskeletal: Normal range of motion. He exhibits no edema.  Neurological: He is alert and oriented to person, place, and time.  Skin: Skin is warm and dry.  Psychiatric: He has a normal mood and affect.    ED Course  Procedures (including critical care time)  Labs Reviewed  CBC - Abnormal; Notable for the following:    WBC 14.4 (*)    All other components within normal limits  I-STAT, CHEM 8  URINALYSIS, ROUTINE W REFLEX  MICROSCOPIC  URINE CULTURE   No results found.   No diagnosis found.    MDM  MDM Reviewed: nursing note and  vitals Interpretation: labs, x-ray and CT scan      Patient and Dr. Dwain Sarna informed of narrowing of the distal aorta and need for follow up studies and vascular.  Patient and MD verbalize understanding and agree to follow up     Adyen Bifulco K Boris Engelmann-Rasch, MD 10/16/11 [phone]

## 2011-10-17 ENCOUNTER — Inpatient Hospital Stay (HOSPITAL_COMMUNITY): Payer: Medicare Other

## 2011-10-17 LAB — CBC
HCT: 40 % (ref 39.0–52.0)
Hemoglobin: 13.5 g/dL (ref 13.0–17.0)
MCH: 31.5 pg (ref 26.0–34.0)
MCHC: 33.8 g/dL (ref 30.0–36.0)
RDW: 13.4 % (ref 11.5–15.5)

## 2011-10-17 LAB — URINE CULTURE
Colony Count: NO GROWTH
Culture: NO GROWTH

## 2011-10-17 LAB — BASIC METABOLIC PANEL
BUN: 20 mg/dL (ref 6–23)
Chloride: 108 mEq/L (ref 96–112)
Creatinine, Ser: 1 mg/dL (ref 0.50–1.35)
GFR calc non Af Amer: 75 mL/min — ABNORMAL LOW (ref >90)
Glucose, Bld: 97 mg/dL (ref 70–99)
Potassium: 3.9 mEq/L (ref 3.5–5.1)

## 2011-10-17 MED ORDER — CHLORHEXIDINE GLUCONATE 0.12 % MT SOLN
15.0000 mL | Freq: Two times a day (BID) | OROMUCOSAL | Status: DC
  Administered 2011-10-17 (×2): 15 mL via OROMUCOSAL
  Filled 2011-10-17: qty 15

## 2011-10-17 MED ORDER — BIOTENE DRY MOUTH MT LIQD
15.0000 mL | Freq: Two times a day (BID) | OROMUCOSAL | Status: DC
  Administered 2011-10-17: 15 mL via OROMUCOSAL

## 2011-10-17 NOTE — Progress Notes (Signed)
* No surgery found *  Subjective: Feeling better and passing gas.  No complaints except NG tube.     Objective: Vital signs in last 24 hours: Temp:  [97.8 F (36.6 C)-98.8 F (37.1 C)] 97.8 F (36.6 C) (11/17 0535) Pulse Rate:  [71-87] 76  (11/17 0535) Resp:  [18] 18  (11/17 0535) BP: (105-141)/(57-71) 130/65 mmHg (11/17 0535) SpO2:  [93 %-95 %] 93 % (11/17 0535) Weight:  [192 lb 12.8 oz (87.454 kg)] 192 lb 12.8 oz (87.454 kg) (11/16 1100)   Intake/Output from previous day: 11/16 0701 - 11/17 0700 In: 1892 [I.V.:1882; IV Piggyback:10] Out: 975 [Urine:425; Emesis/NG output:250] Intake/Output this shift:    abdomen is soft and nontender  Lab Results:   [facility] 10/17/11 0743 10/16/11 0928  WBC 5.6 12.9*  HGB 13.5 14.0  HCT 40.0 40.9  PLT 206 225   BMET  Basename 10/16/11 0928 10/16/11 0329  NA -- 140  K -- 4.1  CL -- 104  CO2 -- --  GLUCOSE -- 173*  BUN -- 19  CREATININE 0.80 1.00  CALCIUM -- --   PT/INR No results found for this basename: LABPROT:2,INR:2 in the last 72 hours  Studies/Results: Ct Abdomen Pelvis W Contrast  10/16/2011  *RADIOLOGY REPORT*  Clinical Data: Intense lower abdominal pain.  CT ABDOMEN AND PELVIS WITH CONTRAST  Technique:  Multidetector CT imaging of the abdomen and pelvis was performed following the standard protocol during bolus administration of intravenous contrast.  Contrast: OMNIPAQUE IOHEXOL 300 MG/ML IV SOLN  Comparison: CT of the abdomen and pelvis performed 06/28/2007, and abdominal radiograph performed earlier today at 03:12 a.m.  Findings: Mild bibasilar atelectasis is noted.  A minimal 5 mm hypodensity within the liver adjacent to the gallbladder fossa may reflect a small cyst, but is too small to characterize.  The liver is otherwise unremarkable in appearance. The spleen is normal in appearance.  The gallbladder is within normal limits.  The pancreas and adrenal glands are unremarkable.  A horseshoe kidney is again  noted, with mildly increased surrounding perinephric stranding.  There is no evidence of hydronephrosis.  No renal or ureteral stones are seen.  There is distension of small bowel loops to 4.4 cm in maximal diameter at the lower abdomen, with mild fecalization noted just proximal to the transition point at the anterior right lower abdominal wall, likely reflecting an adhesion due to the patient's prior surgeries.  The distal small bowel is mostly decompressed. The transition point does not involve the anterior abdominal wall mesh on the left side.  The stomach is within normal limits.  No acute vascular abnormalities are seen.  There is diffuse calcification along the abdominal aorta and its branches, with severe luminal stenosis at the distal abdominal aorta due to extensive calcification.  The appendix is normal in caliber and unremarkable in appearance. The transverse colon extends along a broad-based herniation of fat along the anterior abdominal wall.  There is no evidence for incarceration or colonic obstruction.  Minimal diverticulosis is noted along the proximal sigmoid colon, without evidence of diverticulitis.  The bladder is decompressed and not well assessed.  The prostate remains normal in size.  Small bilateral inguinal hernias are noted, containing only fat.  No inguinal lymphadenopathy is seen.  No acute osseous abnormalities are identified.  Vacuum phenomenon is noted at L4-L5 and L5-S1.  IMPRESSION:  1.  Relatively high-grade small bowel obstruction, with distension of small bowel loops to 4.4 cm in maximal  diameter, and a transition point at the anterior right lower abdominal wall likely reflecting an adhesion.  Distal small bowel is mostly decompressed. 2.  Note severe luminal stenosis at the distal abdominal aorta due to extensive calcification, significantly worsened from the prior CT; diffuse calcification along the abdominal aorta and its branches. 3.  Broad-based herniation of fat along the  anterior abdominal wall, but the patient's abdominal wall mesh; the transverse colon extends into this herniation, without evidence for incarceration or colonic obstruction. 4.  Minimal diverticulosis along the proximal sigmoid colon, without evidence of diverticulitis. 5.  Small bilateral inguinal hernias, containing only fat. 6.  Likely small hepatic cyst. 7.  Mildly increased perinephric stranding about the patient's horseshoe kidney; horseshoe kidney otherwise unremarkable in appearance. 8.  Mild bibasilar atelectasis noted.  Findings were discussed with Dr. April Palumbo-Rasch at 05:39 a.m. on 10/16/2011.  Original Report Authenticated By: Tonia Ghent, M.D.   Dg Abd Acute W/chest  10/16/2011  *RADIOLOGY REPORT*  Clinical Data: Abdominal pain and nausea.  ACUTE ABDOMEN SERIES (ABDOMEN 2 VIEW & CHEST 1 VIEW)  Comparison: Chest radiograph performed 03/25/2010, and CT of the abdomen and pelvis performed 06/28/2007  Findings: The lungs are well-aerated.  Mild focal left basilar airspace opacity may reflect atelectasis or pneumonia.  There is no evidence of pleural effusion or pneumothorax.  The cardiomediastinal silhouette is borderline normal in size; calcification is noted within the aortic arch.  The visualized bowel gas pattern is unremarkable.  Scattered stool and air are seen within the colon; there is no evidence of small bowel dilatation to suggest obstruction. A few air-fluid levels are noted within small and large bowel loops.  No free intra-abdominal air is identified on the provided upright view.  An abdominal wall mesh is noted at the left lower quadrant.  No acute osseous abnormalities are seen; the sacroiliac joints are unremarkable in appearance.  IMPRESSION:  1.  Unremarkable bowel gas pattern; no free intra-abdominal air seen. 2.  Focal left basilar airspace opacity may reflect atelectasis or pneumonia.  Original Report Authenticated By: Tonia Ghent, M.D.   Dg Abd Portable  2v  10/17/2011  *RADIOLOGY REPORT*  Clinical Data: Small bowel obstruction  ABDOMEN - 2 VIEW  Comparison: CT 10/16/2011  Findings: Nasogastric tube has been placed into the decompressed stomach.  Small bowel nondilated.  Normal distribution of gas and stool throughout the colon.  Laparoscopic hernia repair sutures in the left lower quadrant.  Mild degenerative changes in the lower lumbar spine.  No free air on the decubitus radiograph.  IMPRESSION:  1.  Nasogastric tube placement into the stomach with a nonobstructive bowel gas pattern.  Original Report Authenticated By: Osa Craver, M.D.    Anti-infectives: Anti-infectives    None      Assessment/Plan: Advance diet after discontinuing NG * No surgery found *    LOS: 1 day    Matt B. Daphine Deutscher, MD, [facility] [phone] beeper [phone]  10/17/2011 8:52 AM

## 2011-10-18 DIAGNOSIS — I998 Other disorder of circulatory system: Secondary | ICD-10-CM | POA: Diagnosis present

## 2011-10-18 DIAGNOSIS — K56609 Unspecified intestinal obstruction, unspecified as to partial versus complete obstruction: Secondary | ICD-10-CM | POA: Diagnosis present

## 2011-10-18 NOTE — Progress Notes (Signed)
[facility] Progress Note:   * No surgery found *  Subjective: Remarkably better.  No pain.  Ate fruit before recent obstruction.  Prob food bezoar Objective: Vital signs in last 24 hours: Temp:  [98.2 F (36.8 C)-98.4 F (36.9 C)] 98.2 F (36.8 C) (11/18 0526) Pulse Rate:  [65-80] 65  (11/18 0526) Resp:  [18-20] 20  (11/18 0526) BP: (119-137)/(63-80) 136/80 mmHg (11/18 0526) SpO2:  [92 %-98 %] 95 % (11/18 0526)  Intake/Output from previous day: 11/17 0701 - 11/18 0700 In: 2523 [P.O.:1500; I.V.:1023] Out: 1540 [Urine:1300; Emesis/NG output:240] Intake/Output this shift:    Physical Exam:  Abdomen is nontender Lab Results:   Basename 10/17/11 0743 10/16/11 0928  WBC 5.6 12.9*  HGB 13.5 14.0  HCT 40.0 40.9  PLT 206 225   BMET  Basename 10/17/11 0743 10/16/11 0928 10/16/11 0329  NA 144 -- 140  K 3.9 -- 4.1  CL 108 -- 104  CO2 28 -- --  GLUCOSE 97 -- 173*  BUN 20 -- 19  CREATININE 1.00 0.80 --  CALCIUM 8.4 -- --   PT/INR No results found for this basename: LABPROT:2,INR:2 in the last 72 hours Studies/Results: Dg Abd Portable 2v  10/17/2011  *RADIOLOGY REPORT*  Clinical Data: Small bowel obstruction  ABDOMEN - 2 VIEW  Comparison: CT 10/16/2011  Findings: Nasogastric tube has been placed into the decompressed stomach.  Small bowel nondilated.  Normal distribution of gas and stool throughout the colon.  Laparoscopic hernia repair sutures in the left lower quadrant.  Mild degenerative changes in the lower lumbar spine.  No free air on the decubitus radiograph.  IMPRESSION:  1.  Nasogastric tube placement into the stomach with a nonobstructive bowel gas pattern.  Original Report Authenticated By: Osa Craver, M.D.   Anti-infectives: Anti-infectives    None      Assessment/Plan: Problem List: There is no problem list on file for this patient.   Cleared partial small bowel obstruction Patient has calcification noted on CT in aorta.  He will  advise Dr. Shaune Pollack regarding further workup Plan discharge today * No surgery found *    LOS: 2 days   Matt B. Daphine Deutscher, MD, [facility] [phone] beeper [phone]  10/18/2011 8:50 AM

## 2011-10-18 NOTE — Discharge Summary (Signed)
Physician Discharge Summary  Patient ID: MCCORMICK MACON MRN: [id number] DOB/AGE: [birth year] 70 y.o.  Admit date: 10/16/2011 Discharge date: 10/18/2011  Admission Diagnoses:  Discharge Diagnoses:  Active Problems:  Vascular calcification  Congenital narrowed aorta   Discharged Condition: good  Hospital Course: Admitted with SBO.  It resolved and he is ready to go home  Consults: none  Significant Diagnostic Studies: 1. Relatively high-grade small bowel obstruction, with distension  of small bowel loops to 4.4 cm in maximal diameter, and a  transition point at the anterior right lower abdominal wall likely  reflecting an adhesion. Distal small bowel is mostly decompressed.  2. Note severe luminal stenosis at the distal abdominal aorta due  to extensive calcification, significantly worsened from the prior  CT; diffuse calcification along the abdominal aorta and its  branches.  3. Broad-based herniation of fat along the anterior abdominal  wall, but the patient's abdominal wall mesh; the transverse colon  extends into this herniation, without evidence for incarceration or  colonic obstruction.  4. Minimal diverticulosis along the proximal sigmoid colon,  without evidence of diverticulitis.  5. Small bilateral inguinal hernias, containing only fat.  6. Likely small hepatic cyst.  7. Mildly increased perinephric stranding about the patient's  horseshoe kidney; horseshoe kidney otherwise unremarkable in  appearance.  8. Mild bibasilar atelectasis noted.   Treatments: IV hydration  Discharge Exam: Blood pressure 136/80, pulse 65, temperature 98.2 F (36.8 C), temperature source Oral, resp. rate 20, height 5\' 8"  (1.727 m), weight 192 lb 12.8 oz (87.454 kg), SpO2 95.00%. Abdominal pain and distention have resolved  Disposition: HOme Followup with Dr. Kevan Ny   Current Discharge Medication List    CONTINUE these medications which have NOT CHANGED   Details  aspirin EC 81  MG tablet Take 81 mg by mouth daily.      atorvastatin (LIPITOR) 10 MG tablet Take 10 mg by mouth daily.      Multiple Vitamins-Minerals (MULTIVITAMINS THER. W/MINERALS) TABS Take 1 tablet by mouth daily.      omega-3 acid ethyl esters (LOVAZA) 1 G capsule Take 2 g by mouth 2 (two) times daily.         Follow-up Information    Follow up with GATES,DONNA RUTH. Call in 4 weeks.   Contact information:   [address] [facility] [place] [address] [phone]          Signed: Valarie Merino 10/18/2011, 9:03 AM

## 2011-10-30 ENCOUNTER — Other Ambulatory Visit: Payer: Self-pay

## 2011-10-30 DIAGNOSIS — I700 Atherosclerosis of aorta: Secondary | ICD-10-CM

## 2011-11-02 ENCOUNTER — Encounter: Payer: Self-pay | Admitting: Vascular Surgery

## 2011-11-05 ENCOUNTER — Other Ambulatory Visit (INDEPENDENT_AMBULATORY_CARE_PROVIDER_SITE_OTHER): Payer: Medicare Other | Admitting: *Deleted

## 2011-11-05 DIAGNOSIS — Q251 Coarctation of aorta: Secondary | ICD-10-CM

## 2011-11-05 DIAGNOSIS — I739 Peripheral vascular disease, unspecified: Secondary | ICD-10-CM

## 2011-11-10 ENCOUNTER — Encounter: Payer: Self-pay | Admitting: Vascular Surgery

## 2011-11-11 ENCOUNTER — Ambulatory Visit (INDEPENDENT_AMBULATORY_CARE_PROVIDER_SITE_OTHER): Payer: Medicare Other | Admitting: Vascular Surgery

## 2011-11-11 ENCOUNTER — Encounter: Payer: Self-pay | Admitting: Vascular Surgery

## 2011-11-11 VITALS — BP 138/84 | HR 88 | Resp 16 | Ht 68.0 in | Wt 198.0 lb

## 2011-11-11 DIAGNOSIS — I350 Nonrheumatic aortic (valve) stenosis: Secondary | ICD-10-CM

## 2011-11-11 DIAGNOSIS — I359 Nonrheumatic aortic valve disorder, unspecified: Secondary | ICD-10-CM

## 2011-11-11 NOTE — Progress Notes (Signed)
[facility] [place]  Patient name: Matthew Hodges MRN: [id number] DOB: 06-06-1941 Sex: male  REASON FOR CONSULT: Aortic stenosis. Consult from Dr. Shaune Pollack.  HPI: Matthew Hodges is a 70 y.o. male who had presented with an abdominal obstruction in November of 2012. This ultimately resolved with the NG tube decompression and he did not require further surgery. Part of his workup included a CT scan of the abdomen. An incidental finding was an aortic stenosis with severe calcific disease of his aorta. Vascular surgery was therefore consulted.  The patient does describe the gradual onset of weakness in his legs brought on by ambulation and relieved with rest. He symptoms have been relatively stable over the last year. Symptoms involve his hips thighs and calves bilaterally. There are no other aggravating or alleviating factors. He's had no associated back pain. He's had no rest pain and no history of nonhealing ulcers.  Past Medical History  Diagnosis Date  . Cerebral artery disease   . Hypertension   . Diverticular disease   . Hyperlipidemia   . Cancer     history prostate cancer treated with radiation  . Heart murmur   . Stroke     History reviewed. No pertinent family history.  SOCIAL HISTORY: History  Substance Use Topics  . Smoking status: Former Smoker    Types: Cigarettes    Quit date: 11/30/2004  . Smokeless tobacco: Never Used  . Alcohol Use: No     recovering alcoholic sober since 2004    Allergies  Allergen Reactions  . Latex Rash    Current Outpatient Prescriptions  Medication Sig Dispense Refill  . aspirin EC 81 MG tablet Take 81 mg by mouth daily.        . Cholecalciferol (VITAMIN D3) 400 UNITS tablet Take 400 Units by mouth daily.        . Multiple Vitamins-Minerals (MEGA MULTIVITAMIN FOR MEN PO) Take 1 tablet by mouth daily.        Marland Kitchen omega-3 acid ethyl esters (LOVAZA) 1 G capsule Take 2 g by mouth 2 (two) times daily.        .  sildenafil (VIAGRA) 100 MG tablet Take 100 mg by mouth daily as needed.        Marland Kitchen atorvastatin (LIPITOR) 10 MG tablet Take 20 mg by mouth daily.         REVIEW OF SYSTEMS: Arly.Keller ] denotes positive finding; [  ] denotes negative finding CARDIOVASCULAR:  [ ]  chest pain   [ ]  chest pressure   [ ]  palpitations   [ ]  orthopnea   Arly.Keller ] dyspnea on exertion   Arly.Keller ] claudication   [ ]  rest pain   [ ]  DVT   [ ]  phlebitis PULMONARY:   [ ]  productive cough   [ ]  asthma   [ ]  wheezing NEUROLOGIC:   [ ]  weakness  [ ]  paresthesias  [ ]  aphasia  [ ]  amaurosis  [ ]  dizziness HEMATOLOGIC:   [ ]  bleeding problems   [ ]  clotting disorders MUSCULOSKELETAL:  [ ]  joint pain   [ ]  joint swelling [ ]  leg swelling GASTROINTESTINAL: [ ]   blood in stool  [ ]   hematemesis GENITOURINARY:  [ ]   dysuria  [ ]   hematuria PSYCHIATRIC:  [ ]  history of major depression INTEGUMENTARY:  [ ]  rashes  [ ]  ulcers CONSTITUTIONAL:  [ ]  fever   [ ]  chills  PHYSICAL EXAM: Filed Vitals:  11/11/11 0855  BP: 138/84  Pulse: 88  Resp: 16  Height: 5\' 8"  (1.727 m)  Weight: 198 lb (89.812 kg)  SpO2: 99%   Body mass index is 30.11 kg/(m^2). GENERAL: The patient is a well-nourished male, in no acute distress. The vital signs are documented above. CARDIOVASCULAR: There is a regular rate and rhythm without significant murmur appreciated. I do not detect any carotid bruits. He has palpable femoral, popliteal, dorsalis pedis, and posterior tibial pulses bilaterally. PULMONARY: There is good air exchange bilaterally without wheezing or rales. ABDOMEN: Soft and non-tender with normal pitched bowel sounds. I cannot palpate an abdominal aortic aneurysm. He has multiple incisions on his abdomen from previous abdominal surgery for diverticulosis. He had a colostomy at one point. This has been reversed. MUSCULOSKELETAL: There are no major deformities or cyanosis. NEUROLOGIC: No focal weakness or paresthesias are detected. SKIN: There are no ulcers or  rashes noted. PSYCHIATRIC: The patient has a normal affect.  DATA:  Lab Results  Component Value Date   WBC 5.6 10/17/2011   HGB 13.5 10/17/2011   HCT 40.0 10/17/2011   MCV 93.2 10/17/2011   PLT 206 10/17/2011   Lab Results  Component Value Date   NA 144 10/17/2011   K 3.9 10/17/2011   CL 108 10/17/2011   CO2 28 10/17/2011   Lab Results  Component Value Date   CREATININE 1.00 10/17/2011   I have independently interpreted his arterial Doppler study which shows triphasic Doppler signals in the dorsalis pedis and posterior tibial positions bilaterally. ABI's are 100% bilaterally. I suspect that these wouldn't drop with exercise.  I have also reviewed his CT scan which does show severe calcific disease of his infrarenal aorta and common iliac arteries. The distal aorta does narrowed down significantly. The aorta does not appear to be occluded however.  MEDICAL ISSUES: This patient has significant aortic stenosis related to calcific atherosclerotic disease which is fairly diffuse. Fortunately, his symptoms are quite tolerable. He has mild bilateral lower from a claudication. He is not a smoker fortunately. I have encouraged him to stay as active as possible to maintain collateral flow and lower his risk of acutely occluding his aorta. Because of the severe calcific disease, PTA and stenting of the aortic stenosis would be associated with significant risk of dissection and significant aortic injury. In addition, because of his multiple previous abdominal operations, I would not favor aorta with bifemoral bypass grafting. I offered to see him in 18 months however he feels quite comfortable continuing to follow with Dr. Kevan Ny and he can call or Dr. Kevan Ny can call at any time in the future if his symptoms progress. He does know to continue taking his aspirin.  DICKSON,CHRISTOPHER S [facility] [place]: [phone]

## 2014-09-11 ENCOUNTER — Other Ambulatory Visit: Payer: Self-pay | Admitting: Physician Assistant

## 2015-03-05 ENCOUNTER — Other Ambulatory Visit: Payer: Self-pay | Admitting: Surgery

## 2015-03-05 ENCOUNTER — Encounter: Payer: Self-pay | Admitting: Surgery

## 2015-03-05 NOTE — H&P (Signed)
Matthew Hodges 03/05/2015 8:50 AM Location: [facility] Patient #: [id number] DOB: Sep 01, 1941 Married / Language: [name] / Race: White Male History of Present Illness [name] MD; 03/05/2015 9:37 AM) Patient words: hernia.  The patient is a 74 year old male who presents with an incisional hernia. Patient sent by his primary care physician, Dr. Darcus [place] and [place] at [place]. Concern for recurrent hernia Pleasant obese male. Developed perforated diverticulitis & required emergency open colectomy/colostomy March 2000. Postop abscesses and fistula drained/repaired April 2000. Recovered. Colostomy takedown October 2000. Developed incisional hernias. Open repair and mesh 2001. All by Dr Excell Seltzer. Developed hernia at old colostomy site. I became involved. I did laparoscopic repair in 2008. Recovered. Developed recurrent stitch abscesses had midline and old colostomy site. I removed these areas in 2011. Mesh did not get infected. He recovered. He's had no surgery since. No wound problems since. He did have an episode of bowel obstruction in 2012. CT scan noted midline hernias. Recovered quickly. No surgery. None since. Patient however is concerned that the hernias were more obvious now. Occasional discomfort but no severe pain. Eating well. No bad bouts of constipation or diarrhea. No nausea or vomiting. Refuses to get colonoscopies. No major anemia or rectal bleeding. Can walk 20 minutes without difficulty. Wished to consider hernia repair to avoid future problems. Other Problems Marjean Donna, CMA; 03/05/2015 8:50 AM) Alcohol Abuse Bladder Problems Cerebrovascular Accident Hypercholesterolemia Melanoma Prostate Cancer  Past Surgical History [name], MD; 03/05/2015 9:27 AM) Colon Removal - Partial Resection of Stomach LAPAROSCOPIC REPAIR, INCISIONAL HERNIA, REDUCIBLE ([id number]) 2008 PHYSICIAN: [name], MD DATE OF BIRTH:  [birth year]  DATE OF PROCEDURE: 09/02/2007 DATE OF DISCHARGE:   OPERATIVE REPORT  PRIMARY CARE PHYSICIAN: Conchita Paris, M.D.  SURGEON: Michael Boston, M.D.  ASSISTANT: Edsel Petrin. Dalbert Batman, M.D., as well as Carron Curie, P.A. student.  PREOPERATIVE DIAGNOSIS: History of prior abdominal surgeries and ventral hernia with paracolostomy hernia, possible midline ventral hernia.  POSTOPERATIVE DIAGNOSES: 1. Left lower quadrant incarcerated ventral hernia from old colostomy  site, 9 x 14 cm in size. 2. Extensive abdominal wall adhesions to anterior abdominal wall. 3. No evidence of any significant ventral hernia.  PROCEDURE PERFORMED: 1. Laparoscopic lysis of adhesions x150 minutes (equals 75%). 2. Laparoscopic ventral hernia repair with 20 x 30 cm  Parietex/Seprafilm dual-sided mesh. OPERATIVE FINDINGS: He had very dense adhesions onto his midline involving numerous loops of small intestine and omentum. He had numerous adhesions on his left lateral abdomen as well of omentum along with incarcerated hernia through his colostomy site about 9 x 14 cm in size with omentum and small intestine within it. He had no strong evidence of a midline incisional hernia with prior mesh intact. REMOVAL, SUTURE, WITH ANESTHESIA (29562) 2011 Removal of stitch abscesses at old LLQ colostomy site and midline COLECTOMY WITH END COLOSTOMY (13086) 01/1999 Perf diverticulitis COLOSTOMY TAKEDOWN (57846) 08/1999 Dr Excell Seltzer OPEN REPAIR, HERNIA, ABDOMINAL ([id number]) 02/2000 Open repair of midline & LLQ hwernias with mesh. Dr Excell Seltzer EXPLORATORY LAPAROTOMY (49000) 03/1999 Exploratory laparotomy, drainage of abscesses, repair small bowel fistula.  Diagnostic Studies History Marjean Donna, CMA; 03/05/2015 8:50 AM) Colonoscopy >10 years ago  Allergies Marjean Donna, CMA; 03/05/2015 8:51 AM) Latex Exam Gloves *MEDICAL DEVICES AND SUPPLIES*  Medication History Davy Pique Bynum,  CMA; 03/05/2015 8:52 AM) Aspirin EC (81MG  Tablet DR, Oral) Active. Atorvastatin Calcium (10MG  Tablet, Oral) Active. Vitamin D (400UNIT Capsule, Oral) Active. Multivitamin/Fluoride (0.25MG  Tablet Chewable, Oral) Active. Lovaza (1GM Capsule, Oral) Active.  Viagra (100MG  Tablet, Oral) Active. Medications Reconciled  Social History Marjean Donna, CMA; 03/05/2015 8:50 AM) Alcohol use Remotely quit alcohol use. Caffeine use Coffee. No drug use Tobacco use Former smoker.  Family History Marjean Donna, CMA; 03/05/2015 8:50 AM) Alcohol Abuse Brother, Father, Sister. Depression Sister.     Review of Systems Davy Pique Bynum CMA; 03/05/2015 8:50 AM) General Not Present- Appetite Loss, Chills, Fatigue, Fever, Night Sweats, Weight Gain and Weight Loss. Skin Not Present- Change in Wart/Mole, Dryness, Hives, Jaundice, New Lesions, Non-Healing Wounds, Rash and Ulcer. HEENT Not Present- Earache, Hearing Loss, Hoarseness, Nose Bleed, Oral Ulcers, Ringing in the Ears, Seasonal Allergies, Sinus Pain, Sore Throat, Visual Disturbances, Wears glasses/contact lenses and Yellow Eyes. Respiratory Present- Snoring. Not Present- Bloody sputum, Chronic Cough, Difficulty Breathing and Wheezing. Breast Not Present- Breast Mass, Breast Pain, Nipple Discharge and Skin Changes. Cardiovascular Not Present- Chest Pain, Difficulty Breathing Lying Down, Leg Cramps, Palpitations, Rapid Heart Rate, Shortness of Breath and Swelling of Extremities. Gastrointestinal Not Present- Abdominal Pain, Bloating, Bloody Stool, Change in Bowel Habits, Chronic diarrhea, Constipation, Difficulty Swallowing, Excessive gas, Gets full quickly at meals, Hemorrhoids, Indigestion, Nausea, Rectal Pain and Vomiting. Male Genitourinary Present- Frequency, Impotence and Nocturia. Not Present- Blood in Urine, Change in Urinary Stream, Painful Urination, Urgency and Urine Leakage. Musculoskeletal Not Present- Back Pain, Joint Pain, Joint Stiffness,  Muscle Pain, Muscle Weakness and Swelling of Extremities. Neurological Not Present- Decreased Memory, Fainting, Headaches, Numbness, Seizures, Tingling, Tremor, Trouble walking and Weakness. Psychiatric Not Present- Anxiety, Bipolar, Change in Sleep Pattern, Depression, Fearful and Frequent crying. Endocrine Not Present- Cold Intolerance, Excessive Hunger, Hair Changes, Heat Intolerance, Hot flashes and New Diabetes. Hematology Present- Easy Bruising. Not Present- Excessive bleeding, Gland problems, HIV and Persistent Infections.  Vitals (Sonya Bynum CMA; 03/05/2015 8:51 AM) 03/05/2015 8:50 AM Weight: 198 lb Height: 68in Body Surface Area: 2.08 m Body Mass Index: 30.11 kg/m Temp.: 97.58F(Temporal)  Pulse: 58 (Regular)  BP: 132/80 (Sitting, Left Arm, Standard)     Physical Exam [name] MD; 03/05/2015 9:28 AM)  General Mental Status-Alert. General Appearance-Not in acute distress, Not Sickly. Orientation-Oriented X3. Hydration-Well hydrated. Voice-Normal.  Integumentary Global Assessment Upon inspection and palpation of skin surfaces of the - Axillae: non-tender, no inflammation or ulceration, no drainage. and Distribution of scalp and body hair is normal. General Characteristics Temperature - normal warmth is noted.  Head and Neck Head-normocephalic, atraumatic with no lesions or palpable masses. Face Global Assessment - atraumatic, no absence of expression. Neck Global Assessment - no abnormal movements, no bruit auscultated on the right, no bruit auscultated on the left, no decreased range of motion, non-tender. Trachea-midline. Thyroid Gland Characteristics - non-tender.  Eye Eyeball - Left-Extraocular movements intact, No Nystagmus. Eyeball - Right-Extraocular movements intact, No Nystagmus. Cornea - Left-No Hazy. Cornea - Right-No Hazy. Sclera/Conjunctiva - Left-No scleral icterus, No Discharge. Sclera/Conjunctiva -  Right-No scleral icterus, No Discharge. Pupil - Left-Direct reaction to light normal. Pupil - Right-Direct reaction to light normal.  ENMT Ears Pinna - Left - no drainage observed, no generalized tenderness observed. Right - no drainage observed, no generalized tenderness observed. Nose and Sinuses External Inspection of the Nose - no destructive lesion observed. Inspection of the nares - Left - quiet respiration. Right - quiet respiration. Mouth and Throat Lips - Upper Lip - no fissures observed, no pallor noted. Lower Lip - no fissures observed, no pallor noted. Nasopharynx - no discharge present. Oral Cavity/Oropharynx - Tongue - no dryness observed. Oral Mucosa - no cyanosis  observed. Hypopharynx - no evidence of airway distress observed.  Chest and Lung Exam Inspection Movements - Normal and Symmetrical. Accessory muscles - No use of accessory muscles in breathing. Palpation Palpation of the chest reveals - Non-tender. Auscultation Breath sounds - Normal and Clear.  Cardiovascular Auscultation Rhythm - Regular. Murmurs & Other Heart Sounds - Auscultation of the heart reveals - No Murmurs and No Systolic Clicks.  Abdomen Inspection Inspection of the abdomen reveals - No Visible peristalsis and No Abnormal pulsations. Umbilicus - No Bleeding, No Urine drainage. Palpation/Percussion Palpation and Percussion of the abdomen reveal - Soft, Non Tender, No Rebound tenderness, No Rigidity (guarding) and No Cutaneous hyperesthesia. Note: Obese but doughy soft. Periumbilical midline Swiss cheese hernias. No LEFT lower quadrant recurrent hernia colostomy. No definite inguinal or epigastric hernias.    Male Genitourinary Sexual Maturity Tanner 5 - Adult hair pattern and Adult penile size and shape.  Peripheral Vascular Upper Extremity Inspection - Left - No Cyanotic nailbeds, Not Ischemic. Right - No Cyanotic nailbeds, Not Ischemic.  Neurologic Neurologic evaluation reveals  -normal attention span and ability to concentrate, able to name objects and repeat phrases. Appropriate fund of knowledge , normal sensation and normal coordination. Mental Status Affect - not angry, not paranoid. Cranial Nerves-Normal Bilaterally. Gait-Normal.  Neuropsychiatric Mental status exam performed with findings of-able to articulate well with normal speech/language, rate, volume and coherence, thought content normal with ability to perform basic computations and apply abstract reasoning and no evidence of hallucinations, delusions, obsessions or homicidal/suicidal ideation.  Musculoskeletal Global Assessment Spine, Ribs and Pelvis - no instability, subluxation or laxity. Right Upper Extremity - no instability, subluxation or laxity.  Lymphatic Head & Neck  General Head & Neck Lymphatics: Bilateral - Description - No Localized lymphadenopathy. Axillary  General Axillary Region: Bilateral - Description - No Localized lymphadenopathy. Femoral & Inguinal  Generalized Femoral & Inguinal Lymphatics: Left - Description - No Localized lymphadenopathy. Right - Description - No Localized lymphadenopathy.    Assessment & Plan [name] MD; 03/05/2015 9:38 AM)  RECURRENT VENTRAL INCISIONAL HERNIA (553.21  K43.2) Impression: They are not horribly symptomatic but they have gotten larger. He is had problems with a bowel obstruction since his last surgery in 2011. He is concerned about having more abdominal problems. The patient is wife are interested in proceeding with surgery.  His reasonable to do a laparoscopic approach. I think his risk of bowel injury / fistula /abscess or leak is definitely higher. However, he tolerated the repair of his LEFT lower quadrant colostomy hernia laparoscopically without major event and has no recurrence. No infections in 5 years. Reasonable for laparoscopic approach. He is already had an onlay open repair, so different layer intraperitoneal  would be a good idea.  Current Plans Schedule for Surgery Discussed regular exercise with patient. The anatomy & physiology of the abdominal wall was discussed. The pathophysiology of hernias was discussed. Natural history risks without surgery including progeressive enlargement, pain, incarceration, & strangulation was discussed. Contributors to complications such as smoking, obesity, diabetes, prior surgery, etc were discussed.  I feel the risks of no intervention will lead to serious problems that outweigh the operative risks; therefore, I recommended surgery to reduce and repair the hernia. I explained laparoscopic techniques with possible need for an open approach. I noted the probable use of mesh to patch and/or buttress the hernia repair  Risks such as bleeding, infection, abscess, need for further treatment, heart attack, death, and other risks were discussed. I noted a  good likelihood this will help address the problem. Goals of post-operative recovery were discussed as well. Possibility that this will not correct all symptoms was explained. I stressed the importance of low-impact activity, aggressive pain control, avoiding constipation, & not pushing through pain to minimize risk of post-operative chronic pain or injury. Possibility of reherniation especially with smoking, obesity, diabetes, immunosuppression, and other health conditions was discussed. We will work to minimize complications.  An educational handout further explaining the pathology & treatment options was given as well. Questions were answered. The patient expresses understanding & wishes to proceed with surgery. Pt Education - [facility] Hernia Post-Op HCI (Benzion Mesta): discussed with patient and provided information. Pt Education - [facility] Pain Control ([place] Zou) Pt Education - [facility] Good Bowel Health (Lun Muro)  [name], M.D., F.A.C.S. Gastrointestinal and Minimally Invasive Surgery [facility] [address]  [place], [place] [address] [phone] Main / Paging

## 2015-04-15 NOTE — Patient Instructions (Addendum)
Matthew Hodges  04/15/2015   Your procedure is scheduled on:    04/23/2015    Report to [facility] Main  Entrance and follow signs to               [place] at       [employer].  Call this number if you have problems the morning of surgery [phone]   Remember: ONLY 1 PERSON MAY GO WITH YOU TO SHORT STAY TO GET  READY MORNING OF [place].  Do not eat food or drink liquids :After Midnight.     Take these medicines the morning of surgery with A SIP OF WATER: none                                You may not have any metal on your body including hair pins and              piercings  Do not wear jewelry, , lotions, powders or perfumes, deodorant                          Men may shave face and neck.   Do not bring valuables to the hospital. [place].  Contacts, dentures or bridgework may not be worn into surgery.  Leave suitcase in the car. After surgery it may be brought to your room.         Special Instructions: coughing and deep breathing exercises, leg exercises               Please read over the following fact sheets you were given: _____________________________________________________________________             [facility] - Preparing for Surgery Before surgery, you can play an important role.  Because skin is not sterile, your skin needs to be as free of germs as possible.  You can reduce the number of germs on your skin by washing with CHG (chlorahexidine gluconate) soap before surgery.  CHG is an antiseptic cleaner which kills germs and bonds with the skin to continue killing germs even after washing. Please DO NOT use if you have an allergy to CHG or antibacterial soaps.  If your skin becomes reddened/irritated stop using the CHG and inform your nurse when you arrive at Short Stay. Do not shave (including legs and underarms) for at least 48 hours prior to the first CHG shower.  You may  shave your face/neck. Please follow these instructions carefully:  1.  Shower with CHG Soap the night before surgery and the  morning of Surgery.  2.  If you choose to wash your hair, wash your hair first as usual with your  normal  shampoo.  3.  After you shampoo, rinse your hair and body thoroughly to remove the  shampoo.                           4.  Use CHG as you would any other liquid soap.  You can apply chg directly  to the skin and wash  Gently with a scrungie or clean washcloth.  5.  Apply the CHG Soap to your body ONLY FROM THE NECK DOWN.   Do not use on face/ open                           Wound or open sores. Avoid contact with eyes, ears mouth and genitals (private parts).                       Wash face,  Genitals (private parts) with your normal soap.             6.  Wash thoroughly, paying special attention to the area where your surgery  will be performed.  7.  Thoroughly rinse your body with warm water from the neck down.  8.  DO NOT shower/wash with your normal soap after using and rinsing off  the CHG Soap.                9.  Pat yourself dry with a clean towel.            10.  Wear clean pajamas.            11.  Place clean sheets on your bed the night of your first shower and do not  sleep with pets. Day of Surgery : Do not apply any lotions/deodorants the morning of surgery.  Please wear clean clothes to the hospital/surgery center.  FAILURE TO FOLLOW THESE INSTRUCTIONS MAY RESULT IN THE CANCELLATION OF YOUR SURGERY PATIENT SIGNATURE_________________________________  NURSE SIGNATURE__________________________________  ________________________________________________________________________

## 2015-04-17 ENCOUNTER — Encounter (HOSPITAL_COMMUNITY): Payer: Self-pay

## 2015-04-17 ENCOUNTER — Encounter (HOSPITAL_COMMUNITY)
Admission: RE | Admit: 2015-04-17 | Discharge: 2015-04-17 | Disposition: A | Discharge status: Home / Self Care | Payer: Medicare Other | Source: Ambulatory Visit | Attending: Surgery | Admitting: Surgery

## 2015-04-17 DIAGNOSIS — Z01818 Encounter for other preprocedural examination: Secondary | ICD-10-CM | POA: Diagnosis present

## 2015-04-17 DIAGNOSIS — I4519 Other right bundle-branch block: Secondary | ICD-10-CM | POA: Insufficient documentation

## 2015-04-17 DIAGNOSIS — I454 Nonspecific intraventricular block: Secondary | ICD-10-CM

## 2015-04-17 HISTORY — DX: Nonspecific intraventricular block: I45.4

## 2015-04-17 LAB — BASIC METABOLIC PANEL
Anion gap: 9 (ref 5–15)
BUN: 22 mg/dL — ABNORMAL HIGH (ref 6–20)
CO2: 27 mmol/L (ref 22–32)
Calcium: 9.1 mg/dL (ref 8.9–10.3)
Chloride: 105 mmol/L (ref 101–111)
Creatinine, Ser: 1.11 mg/dL (ref 0.61–1.24)
GFR calc Af Amer: >60 mL/min (ref >60)
GFR calc non Af Amer: >60 mL/min (ref >60)
Glucose, Bld: 102 mg/dL — ABNORMAL HIGH (ref 65–99)
POTASSIUM: 4.3 mmol/L (ref 3.5–5.1)
SODIUM: 141 mmol/L (ref 135–145)

## 2015-04-17 LAB — CBC
HCT: 40.8 % (ref 39.0–52.0)
Hemoglobin: 13.5 g/dL (ref 13.0–17.0)
MCH: 30.7 pg (ref 26.0–34.0)
MCHC: 33.1 g/dL (ref 30.0–36.0)
MCV: 92.7 fL (ref 78.0–100.0)
PLATELETS: 225 10*3/uL (ref 150–400)
RBC: 4.4 MIL/uL (ref 4.22–5.81)
RDW: 12.9 % (ref 11.5–15.5)
WBC: 7.5 10*3/uL (ref 4.0–10.5)

## 2015-04-18 NOTE — Progress Notes (Signed)
LOV note PCP Dr. Darcus Austin 01-24-15 on chart

## 2015-04-18 NOTE — Progress Notes (Signed)
Final EKG doneo 04/17/15 in EPIC.

## 2015-04-22 MED ORDER — BUPIVACAINE 0.25 % ON-Q PUMP DUAL CATH 300 ML
300.0000 mL | INJECTION | Status: DC
  Filled 2015-04-22: qty 300

## 2015-04-22 MED ORDER — GENTAMICIN SULFATE 40 MG/ML IJ SOLN
400.0000 mg | INTRAVENOUS | Status: AC
  Administered 2015-04-23: 400 mg via INTRAVENOUS
  Filled 2015-04-22: qty 10

## 2015-04-22 NOTE — Anesthesia Preprocedure Evaluation (Addendum)
Anesthesia Evaluation  Patient identified by MRN, date of birth, ID band Patient awake    Reviewed: Allergy & Precautions, H&P , NPO status , Patient's Chart, lab work & pertinent test results  Airway Mallampati: II  TM Distance: >3 FB Neck ROM: full    Dental  (+) Edentulous Upper, Edentulous Lower, Dental Advisory Given   Pulmonary neg pulmonary ROS, former smoker,  breath sounds clear to auscultation  Pulmonary exam normal       Cardiovascular hypertension, Normal cardiovascular examRhythm:regular Rate:Normal     Neuro/Psych CVA 1979 CVA, No Residual Symptoms negative psych ROS   GI/Hepatic negative GI ROS, Neg liver ROS,   Endo/Other  negative endocrine ROS  Renal/GU negative Renal ROS  negative genitourinary   Musculoskeletal   Abdominal   Peds  Hematology negative hematology ROS (+)   Anesthesia Other Findings Prostate cancer  Reproductive/Obstetrics negative OB ROS                            Anesthesia Physical Anesthesia Plan  ASA: III  Anesthesia Plan: General   Post-op Pain Management:    Induction: Intravenous  Airway Management Planned: Oral ETT  Additional Equipment:   Intra-op Plan:   Post-operative Plan: Extubation in OR  Informed Consent: I have reviewed the patients History and Physical, chart, labs and discussed the procedure including the risks, benefits and alternatives for the proposed anesthesia with the patient or authorized representative who has indicated his/her understanding and acceptance.   Dental Advisory Given  Plan Discussed with: CRNA and Surgeon  Anesthesia Plan Comments:         Anesthesia Quick Evaluation

## 2015-04-23 ENCOUNTER — Ambulatory Visit (HOSPITAL_COMMUNITY): Payer: Medicare Other | Admitting: Anesthesiology

## 2015-04-23 ENCOUNTER — Observation Stay (HOSPITAL_COMMUNITY)
Admission: RE | Admit: 2015-04-23 | Discharge: 2015-04-24 | Disposition: A | Discharge status: Home / Self Care | Payer: Medicare Other | Source: Ambulatory Visit | Attending: Surgery | Admitting: Surgery

## 2015-04-23 ENCOUNTER — Encounter (HOSPITAL_COMMUNITY): Payer: Self-pay | Admitting: *Deleted

## 2015-04-23 ENCOUNTER — Encounter (HOSPITAL_COMMUNITY)
Admission: RE | Disposition: A | Discharge status: Home / Self Care | Payer: Self-pay | Source: Ambulatory Visit | Attending: Surgery

## 2015-04-23 DIAGNOSIS — Z8546 Personal history of malignant neoplasm of prostate: Secondary | ICD-10-CM | POA: Diagnosis not present

## 2015-04-23 DIAGNOSIS — Z6829 Body mass index (BMI) 29.0-29.9, adult: Secondary | ICD-10-CM | POA: Diagnosis not present

## 2015-04-23 DIAGNOSIS — E669 Obesity, unspecified: Secondary | ICD-10-CM | POA: Diagnosis not present

## 2015-04-23 DIAGNOSIS — I10 Essential (primary) hypertension: Secondary | ICD-10-CM | POA: Diagnosis not present

## 2015-04-23 DIAGNOSIS — Z7982 Long term (current) use of aspirin: Secondary | ICD-10-CM | POA: Insufficient documentation

## 2015-04-23 DIAGNOSIS — E780 Pure hypercholesterolemia: Secondary | ICD-10-CM | POA: Diagnosis not present

## 2015-04-23 DIAGNOSIS — K432 Incisional hernia without obstruction or gangrene: Secondary | ICD-10-CM | POA: Diagnosis not present

## 2015-04-23 DIAGNOSIS — Z933 Colostomy status: Secondary | ICD-10-CM | POA: Diagnosis not present

## 2015-04-23 DIAGNOSIS — Z9049 Acquired absence of other specified parts of digestive tract: Secondary | ICD-10-CM | POA: Diagnosis not present

## 2015-04-23 DIAGNOSIS — Z87891 Personal history of nicotine dependence: Secondary | ICD-10-CM | POA: Insufficient documentation

## 2015-04-23 DIAGNOSIS — Z91040 Latex allergy status: Secondary | ICD-10-CM | POA: Diagnosis not present

## 2015-04-23 DIAGNOSIS — Z8673 Personal history of transient ischemic attack (TIA), and cerebral infarction without residual deficits: Secondary | ICD-10-CM | POA: Diagnosis not present

## 2015-04-23 HISTORY — PX: LYSIS OF ADHESION: SHX5961

## 2015-04-23 HISTORY — PX: VENTRAL HERNIA REPAIR: SHX424

## 2015-04-23 HISTORY — DX: Incisional hernia without obstruction or gangrene: K43.2

## 2015-04-23 SURGERY — REPAIR, HERNIA, VENTRAL, LAPAROSCOPIC
Anesthesia: General | Site: Abdomen

## 2015-04-23 MED ORDER — CISATRACURIUM BESYLATE (PF) 10 MG/5ML IV SOLN
INTRAVENOUS | Status: DC | PRN
  Administered 2015-04-23: 12 mg via INTRAVENOUS
  Administered 2015-04-23 (×3): 2 mg via INTRAVENOUS
  Administered 2015-04-23: 4 mg via INTRAVENOUS

## 2015-04-23 MED ORDER — HYDROMORPHONE HCL 2 MG/ML IJ SOLN
INTRAMUSCULAR | Status: AC
  Filled 2015-04-23: qty 1

## 2015-04-23 MED ORDER — LACTATED RINGERS IV SOLN
INTRAVENOUS | Status: DC | PRN
  Administered 2015-04-23 (×2): via INTRAVENOUS

## 2015-04-23 MED ORDER — LACTATED RINGERS IV SOLN
INTRAVENOUS | Status: DC
  Administered 2015-04-23: 18:00:00 via INTRAVENOUS
  Administered 2015-04-23: 1000 mL via INTRAVENOUS
  Administered 2015-04-23: 15:00:00 via INTRAVENOUS

## 2015-04-23 MED ORDER — LACTATED RINGERS IV SOLN
INTRAVENOUS | Status: DC
  Administered 2015-04-23: 1000 mL via INTRAVENOUS

## 2015-04-23 MED ORDER — LACTATED RINGERS IR SOLN
Status: DC | PRN
  Administered 2015-04-23: 1000 mL

## 2015-04-23 MED ORDER — HYDROMORPHONE HCL 1 MG/ML IJ SOLN
INTRAMUSCULAR | Status: DC | PRN
  Administered 2015-04-23 (×2): 1 mg via INTRAVENOUS

## 2015-04-23 MED ORDER — BUPIVACAINE-EPINEPHRINE 0.25% -1:200000 IJ SOLN
INTRAMUSCULAR | Status: AC
  Filled 2015-04-23: qty 2

## 2015-04-23 MED ORDER — BUPIVACAINE 0.25 % ON-Q PUMP DUAL CATH 300 ML
300.0000 mL | INJECTION | Status: DC
  Filled 2015-04-23: qty 300

## 2015-04-23 MED ORDER — CEFAZOLIN SODIUM-DEXTROSE 2-3 GM-% IV SOLR
INTRAVENOUS | Status: AC
  Filled 2015-04-23: qty 50

## 2015-04-23 MED ORDER — ASPIRIN EC 81 MG PO TBEC
81.0000 mg | DELAYED_RELEASE_TABLET | Freq: Every morning | ORAL | Status: DC
  Administered 2015-04-23 – 2015-04-24 (×2): 81 mg via ORAL
  Filled 2015-04-23 (×2): qty 1

## 2015-04-23 MED ORDER — CHLORHEXIDINE GLUCONATE 4 % EX LIQD: 1.0000 "application " | Freq: Once | CUTANEOUS | Status: DC

## 2015-04-23 MED ORDER — CISATRACURIUM BESYLATE 20 MG/10ML IV SOLN
INTRAVENOUS | Status: AC
  Filled 2015-04-23: qty 10

## 2015-04-23 MED ORDER — HYDROMORPHONE HCL 1 MG/ML IJ SOLN: 0.5000 mg | INTRAMUSCULAR | Status: DC | PRN

## 2015-04-23 MED ORDER — MIDAZOLAM HCL 5 MG/5ML IJ SOLN
INTRAMUSCULAR | Status: DC | PRN
  Administered 2015-04-23: 2 mg via INTRAVENOUS

## 2015-04-23 MED ORDER — GLYCOPYRROLATE 0.2 MG/ML IJ SOLN
INTRAMUSCULAR | Status: DC | PRN
  Administered 2015-04-23: 0.6 mg via INTRAVENOUS

## 2015-04-23 MED ORDER — MENTHOL 3 MG MT LOZG
1.0000 | LOZENGE | OROMUCOSAL | Status: DC | PRN
  Filled 2015-04-23: qty 9

## 2015-04-23 MED ORDER — STERILE WATER FOR IRRIGATION IR SOLN
Status: DC | PRN
  Administered 2015-04-23: 1500 mL

## 2015-04-23 MED ORDER — MIDAZOLAM HCL 2 MG/2ML IJ SOLN
INTRAMUSCULAR | Status: AC
  Filled 2015-04-23: qty 2

## 2015-04-23 MED ORDER — SODIUM CHLORIDE 0.9 % IJ SOLN: 3.0000 mL | INTRAMUSCULAR | Status: DC | PRN

## 2015-04-23 MED ORDER — ACETAMINOPHEN 325 MG PO TABS
325.0000 mg | ORAL_TABLET | Freq: Four times a day (QID) | ORAL | Status: DC | PRN
  Administered 2015-04-24 (×2): 650 mg via ORAL
  Filled 2015-04-23 (×2): qty 2

## 2015-04-23 MED ORDER — HEPARIN SODIUM (PORCINE) 5000 UNIT/ML IJ SOLN
5000.0000 [IU] | Freq: Three times a day (TID) | INTRAMUSCULAR | Status: DC
  Administered 2015-04-24: 5000 [IU] via SUBCUTANEOUS
  Filled 2015-04-23 (×4): qty 1

## 2015-04-23 MED ORDER — DIPHENHYDRAMINE HCL 50 MG/ML IJ SOLN
12.5000 mg | Freq: Four times a day (QID) | INTRAMUSCULAR | Status: DC | PRN
  Administered 2015-04-23: 12.5 mg via INTRAVENOUS
  Filled 2015-04-23: qty 1

## 2015-04-23 MED ORDER — LIP MEDEX EX OINT
1.0000 "application " | TOPICAL_OINTMENT | Freq: Two times a day (BID) | CUTANEOUS | Status: DC
  Administered 2015-04-24: 1 via TOPICAL
  Filled 2015-04-23: qty 7

## 2015-04-23 MED ORDER — OXYCODONE HCL 5 MG PO TABS: 5.0000 mg | ORAL_TABLET | Freq: Four times a day (QID) | ORAL | Status: DC | PRN

## 2015-04-23 MED ORDER — ONDANSETRON HCL 4 MG/2ML IJ SOLN
INTRAMUSCULAR | Status: AC
  Filled 2015-04-23: qty 2

## 2015-04-23 MED ORDER — LACTATED RINGERS IV BOLUS (SEPSIS): 1000.0000 mL | Freq: Three times a day (TID) | INTRAVENOUS | Status: DC | PRN

## 2015-04-23 MED ORDER — NEOSTIGMINE METHYLSULFATE 10 MG/10ML IV SOLN
INTRAVENOUS | Status: AC
  Filled 2015-04-23: qty 1

## 2015-04-23 MED ORDER — SUFENTANIL CITRATE 50 MCG/ML IV SOLN
INTRAVENOUS | Status: AC
  Filled 2015-04-23: qty 1

## 2015-04-23 MED ORDER — GLYCOPYRROLATE 0.2 MG/ML IJ SOLN
INTRAMUSCULAR | Status: AC
  Filled 2015-04-23: qty 3

## 2015-04-23 MED ORDER — 0.9 % SODIUM CHLORIDE (POUR BTL) OPTIME
TOPICAL | Status: DC | PRN
  Administered 2015-04-23: 1000 mL

## 2015-04-23 MED ORDER — LIDOCAINE HCL (CARDIAC) 20 MG/ML IV SOLN
INTRAVENOUS | Status: DC | PRN
  Administered 2015-04-23: 100 mg via INTRAVENOUS

## 2015-04-23 MED ORDER — ACETAMINOPHEN 650 MG RE SUPP: 650.0000 mg | Freq: Four times a day (QID) | RECTAL | Status: DC | PRN

## 2015-04-23 MED ORDER — ATORVASTATIN CALCIUM 20 MG PO TABS
20.0000 mg | ORAL_TABLET | Freq: Every day | ORAL | Status: DC
  Administered 2015-04-23: 20 mg via ORAL
  Filled 2015-04-23 (×2): qty 1

## 2015-04-23 MED ORDER — ALUM & MAG HYDROXIDE-SIMETH 200-200-20 MG/5ML PO SUSP: 30.0000 mL | Freq: Four times a day (QID) | ORAL | Status: DC | PRN

## 2015-04-23 MED ORDER — MIRABEGRON ER 25 MG PO TB24
25.0000 mg | ORAL_TABLET | Freq: Every morning | ORAL | Status: DC
  Administered 2015-04-23: 25 mg via ORAL
  Filled 2015-04-23 (×2): qty 1

## 2015-04-23 MED ORDER — LIDOCAINE HCL (CARDIAC) 20 MG/ML IV SOLN
INTRAVENOUS | Status: AC
  Filled 2015-04-23: qty 5

## 2015-04-23 MED ORDER — CEFAZOLIN SODIUM-DEXTROSE 2-3 GM-% IV SOLR
2.0000 g | INTRAVENOUS | Status: AC
  Administered 2015-04-23: 2 g via INTRAVENOUS

## 2015-04-23 MED ORDER — BUPIVACAINE-EPINEPHRINE 0.5% -1:200000 IJ SOLN
INTRAMUSCULAR | Status: AC
  Filled 2015-04-23: qty 2

## 2015-04-23 MED ORDER — BUPIVACAINE-EPINEPHRINE 0.25% -1:200000 IJ SOLN
INTRAMUSCULAR | Status: DC | PRN
  Administered 2015-04-23: 90 mL

## 2015-04-23 MED ORDER — PROPOFOL 10 MG/ML IV BOLUS
INTRAVENOUS | Status: AC
  Filled 2015-04-23: qty 20

## 2015-04-23 MED ORDER — PROMETHAZINE HCL 25 MG/ML IJ SOLN: 6.2500 mg | INTRAMUSCULAR | Status: DC | PRN

## 2015-04-23 MED ORDER — HYDROMORPHONE HCL 1 MG/ML IJ SOLN: 0.2500 mg | INTRAMUSCULAR | Status: DC | PRN

## 2015-04-23 MED ORDER — SODIUM CHLORIDE 0.9 % IJ SOLN
INTRAMUSCULAR | Status: AC
  Filled 2015-04-23: qty 10

## 2015-04-23 MED ORDER — BUPIVACAINE 0.25 % ON-Q PUMP DUAL CATH 300 ML
INJECTION | Status: DC | PRN
  Administered 2015-04-23: 300 mL

## 2015-04-23 MED ORDER — OMEGA-3-ACID ETHYL ESTERS 1 G PO CAPS
1.0000 g | ORAL_CAPSULE | Freq: Every morning | ORAL | Status: DC
  Administered 2015-04-23 – 2015-04-24 (×2): 1 g via ORAL
  Filled 2015-04-23 (×2): qty 1

## 2015-04-23 MED ORDER — IBUPROFEN 800 MG PO TABS
800.0000 mg | ORAL_TABLET | Freq: Four times a day (QID) | ORAL | Status: DC
  Administered 2015-04-23 – 2015-04-24 (×3): 800 mg via ORAL
  Filled 2015-04-23 (×6): qty 1

## 2015-04-23 MED ORDER — SODIUM CHLORIDE 0.9 % IV SOLN: 250.0000 mL | INTRAVENOUS | Status: DC | PRN

## 2015-04-23 MED ORDER — SUFENTANIL CITRATE 50 MCG/ML IV SOLN
INTRAVENOUS | Status: DC | PRN
  Administered 2015-04-23 (×4): 5 ug via INTRAVENOUS
  Administered 2015-04-23: 15 ug via INTRAVENOUS
  Administered 2015-04-23: 10 ug via INTRAVENOUS
  Administered 2015-04-23: 5 ug via INTRAVENOUS

## 2015-04-23 MED ORDER — PHENOL 1.4 % MT LIQD: 2.0000 | OROMUCOSAL | Status: DC | PRN

## 2015-04-23 MED ORDER — MAGIC MOUTHWASH
15.0000 mL | Freq: Four times a day (QID) | ORAL | Status: DC | PRN
  Filled 2015-04-23: qty 15

## 2015-04-23 MED ORDER — ONDANSETRON HCL 4 MG/2ML IJ SOLN
INTRAMUSCULAR | Status: DC | PRN
  Administered 2015-04-23: 4 mg via INTRAVENOUS

## 2015-04-23 MED ORDER — PROPOFOL 10 MG/ML IV BOLUS
INTRAVENOUS | Status: DC | PRN
  Administered 2015-04-23: 150 mg via INTRAVENOUS

## 2015-04-23 MED ORDER — SODIUM CHLORIDE 0.9 % IJ SOLN
3.0000 mL | Freq: Two times a day (BID) | INTRAMUSCULAR | Status: DC
  Administered 2015-04-24: 3 mL via INTRAVENOUS

## 2015-04-23 MED ORDER — KETOROLAC TROMETHAMINE 30 MG/ML IJ SOLN
INTRAMUSCULAR | Status: DC | PRN
  Administered 2015-04-23: 30 mg via INTRAVENOUS

## 2015-04-23 MED ORDER — NEOSTIGMINE METHYLSULFATE 10 MG/10ML IV SOLN
INTRAVENOUS | Status: DC | PRN
  Administered 2015-04-23: 4 mg via INTRAVENOUS

## 2015-04-23 SURGICAL SUPPLY — 53 items
APPLIER CLIP 5 13 M/L LIGAMAX5 (MISCELLANEOUS)
APR CLP MED LRG 5 ANG JAW (MISCELLANEOUS)
BINDER ABDOMINAL 12 ML 46-62 (SOFTGOODS) ×2 IMPLANT
CABLE HIGH FREQUENCY MONO STRZ (ELECTRODE) ×3 IMPLANT
CATH KIT ON-Q SILVERSOAK 7.5 (CATHETERS) IMPLANT
CATH KIT ON-Q SILVERSOAK 7.5IN (CATHETERS) ×6 IMPLANT
CHLORAPREP W/TINT 26ML (MISCELLANEOUS) ×3 IMPLANT
CLIP APPLIE 5 13 M/L LIGAMAX5 (MISCELLANEOUS) IMPLANT
CLOSURE WOUND 1/2 X4 (GAUZE/BANDAGES/DRESSINGS) ×2
DECANTER SPIKE VIAL GLASS SM (MISCELLANEOUS) ×3 IMPLANT
DEVICE SECURE STRAP 25 ABSORB (INSTRUMENTS) ×2 IMPLANT
DEVICE TROCAR PUNCTURE CLOSURE (ENDOMECHANICALS) ×3 IMPLANT
DRAPE LAPAROSCOPIC ABDOMINAL (DRAPES) ×3 IMPLANT
DRAPE WARM FLUID 44X44 (DRAPE) ×3 IMPLANT
DRSG TEGADERM 2-3/8X2-3/4 SM (GAUZE/BANDAGES/DRESSINGS) ×9 IMPLANT
DRSG TEGADERM 4X4.75 (GAUZE/BANDAGES/DRESSINGS) ×2 IMPLANT
ELECT REM PT RETURN 9FT ADLT (ELECTROSURGICAL) ×3
ELECTRODE REM PT RTRN 9FT ADLT (ELECTROSURGICAL) ×1 IMPLANT
GAUZE SPONGE 2X2 8PLY STRL LF (GAUZE/BANDAGES/DRESSINGS) IMPLANT
GLOVE BIOGEL PI IND STRL 6.5 (GLOVE) IMPLANT
GLOVE BIOGEL PI IND STRL 7.0 (GLOVE) IMPLANT
GLOVE BIOGEL PI IND STRL 8 (GLOVE) IMPLANT
GLOVE BIOGEL PI INDICATOR 6.5 (GLOVE) ×2
GLOVE BIOGEL PI INDICATOR 7.0 (GLOVE) ×8
GLOVE BIOGEL PI INDICATOR 8 (GLOVE) ×2
GLOVE ECLIPSE 8.0 STRL XLNG CF (GLOVE) ×1 IMPLANT
GLOVE INDICATOR 8.0 STRL GRN (GLOVE) ×1 IMPLANT
GLOVE SURG SS PI 6.5 STRL IVOR (GLOVE) ×2 IMPLANT
GLOVE SURG SS PI 8.0 STRL IVOR (GLOVE) ×2 IMPLANT
GOWN STRL REUS W/TWL XL LVL3 (GOWN DISPOSABLE) ×12 IMPLANT
HOLDER FOLEY CATH W/STRAP (MISCELLANEOUS) ×2 IMPLANT
KIT BASIN OR (CUSTOM PROCEDURE TRAY) ×3 IMPLANT
MESH VENTRALIGHT ST 10X13IN (Mesh General) ×2 IMPLANT
NDL SPNL 22GX3.5 QUINCKE BK (NEEDLE) IMPLANT
NEEDLE SPNL 22GX3.5 QUINCKE BK (NEEDLE) ×3 IMPLANT
PEN SKIN MARKING BROAD (MISCELLANEOUS) ×3 IMPLANT
SCISSORS LAP 5X35 DISP (ENDOMECHANICALS) ×3 IMPLANT
SET IRRIG TUBING LAPAROSCOPIC (IRRIGATION / IRRIGATOR) ×2 IMPLANT
SHEARS HARMONIC ACE PLUS 36CM (ENDOMECHANICALS) IMPLANT
SLEEVE XCEL OPT CAN 5 100 (ENDOMECHANICALS) ×8 IMPLANT
SPONGE GAUZE 2X2 STER 10/PKG (GAUZE/BANDAGES/DRESSINGS) ×2
STRIP CLOSURE SKIN 1/2X4 (GAUZE/BANDAGES/DRESSINGS) ×4 IMPLANT
SUT MNCRL AB 4-0 PS2 18 (SUTURE) ×3 IMPLANT
SUT PDS AB 1 CT1 27 (SUTURE) ×2 IMPLANT
SUT PROLENE 0 CT 1 30 (SUTURE) ×2 IMPLANT
SUT PROLENE 1 CT 1 30 (SUTURE) ×26 IMPLANT
TOWEL OR 17X26 10 PK STRL BLUE (TOWEL DISPOSABLE) ×3 IMPLANT
TRAY FOLEY BAG SILVER LF 16FR (CATHETERS) ×2 IMPLANT
TRAY FOLEY W/METER SILVER 14FR (SET/KITS/TRAYS/PACK) IMPLANT
TRAY LAPAROSCOPIC (CUSTOM PROCEDURE TRAY) ×3 IMPLANT
TROCAR BLADELESS OPT 5 100 (ENDOMECHANICALS) ×3 IMPLANT
TROCAR XCEL NON-BLD 11X100MML (ENDOMECHANICALS) IMPLANT
TUNNELER SHEATH ON-Q 16GX12 DP (PAIN MANAGEMENT) ×2 IMPLANT

## 2015-04-23 NOTE — Interval H&P Note (Signed)
History and Physical Interval Note:  04/23/2015 7:07 AM  Matthew Hodges  has presented today for surgery, with the diagnosis of Incisonal Ventral Wall Abdominal Hernia  The various methods of treatment have been discussed with the patient and family. After consideration of risks, benefits and other options for treatment, the patient has consented to  Procedure(s): LAPAROSCOPIC VENTRAL WALL HERNIA REPAIR AND LAP LYSIS OF ADHESIONS (N/A) LYSIS OF ADHESION (N/A) as a surgical intervention .  The patient's history has been reviewed, patient examined, no change in status, stable for surgery.  I have reviewed the patient's chart and labs.  Questions were answered to the patient's satisfaction.     Irfan Veal C.

## 2015-04-23 NOTE — Anesthesia Procedure Notes (Signed)
Procedure Name: Intubation Performed by: Chyrel Masson Pre-anesthesia Checklist: Patient identified, Emergency Drugs available, Suction available, Patient being monitored and Timeout performed Patient Re-evaluated:Patient Re-evaluated prior to inductionOxygen Delivery Method: Circle system utilized Preoxygenation: Pre-oxygenation with 100% oxygen Intubation Type: IV induction and Inhalational induction Ventilation: Mask ventilation without difficulty Laryngoscope Size: 3 Grade View: Grade IV Tube type: Oral Tube size: 7.5 mm Number of attempts: 2 Airway Equipment and Method: Stylet Placement Confirmation: ETT inserted through vocal cords under direct vision,  positive ETCO2 and breath sounds checked- equal and bilateral Secured at: 22 cm Dental Injury: Teeth and Oropharynx as per pre-operative assessment  Difficulty Due To: Difficulty was unanticipated

## 2015-04-23 NOTE — Op Note (Signed)
04/23/2015  11:45 AM  PATIENT:  Matthew Hodges  74 y.o. male  Patient Care Team: Darcus Austin, MD as PCP - General (Family Medicine) Druscilla Brownie, MD (Dermatology) Carolan Clines, MD as Attending Physician (Urology)  PRE-OPERATIVE DIAGNOSIS:  Incisonal Ventral Wall Abdominal Hernia  POST-OPERATIVE DIAGNOSIS:  Incisonal Ventral Wall Abdominal Hernia AND ADHESIONS  PROCEDURE:  LAPAROSCOPIC VENTRAL WALL HERNIA REPAIR with mesh LAPAROSCOPIC  LYSIS OF ADHESIONS X 2 HOURS (66% of case)   SURGEON:  Surgeon(s): Michael Boston, MD  ASSISTANT: RN   ANESTHESIA:   local and general  EBL:  Total I/O In: 1000 [I.V.:1000] Out: 200 [Urine:200]  Delay start of Pharmacological VTE agent (>24hrs) due to surgical blood loss or risk of bleeding:  no  DRAINS: none   SPECIMEN:  No Specimen  DISPOSITION OF SPECIMEN:  N/A  COUNTS:  YES  PLAN OF CARE: Admit for overnight observation  PATIENT DISPOSITION:  PACU - hemodynamically stable.  INDICATION: Pleasant patient that required emergency sigmoid colectomy and Hartman resection for perforated diverticulitis in 2000.  Complicated by wound infections and incisional hernias status post colostomy takedown 2000 and onlay repair with mesh 2001.  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  Developed incisional hernia at colostomy site.  I laparoscopically repaired this in 2008.  No evidence of midline hernia then.  Patient has developed a midline incisional hernias with diastases / herniation now.     Recommendation was made for surgical repair:   The anatomy & physiology of the abdominal wall was discussed. The pathophysiology of hernias was discussed. Natural history risks without surgery including progeressive enlargement, pain, incarceration & strangulation was discussed. Contributors to complications such as smoking, obesity, diabetes, prior surgery, etc were discussed.  I feel the risks of no intervention will lead to  serious problems that outweigh the operative risks; therefore, I recommended surgery to reduce and repair the hernia. I explained laparoscopic techniques with possible need for an open approach. I noted the probable use of mesh to patch and/or buttress the hernia repair  Risks such as bleeding, infection, abscess, need for further treatment, heart attack, death, and other risks were discussed. I noted a good likelihood this will help address the problem. Goals of post-operative recovery were discussed as well. Possibility that this will not correct all symptoms was explained. I stressed the importance of low-impact activity, aggressive pain control, avoiding constipation, & not pushing through pain to minimize risk of post-operative chronic pain or injury. Possibility of reherniation especially with smoking, obesity, diabetes, immunosuppression, and other health conditions was discussed. We will work to minimize complications.  An educational handout further explaining the pathology & treatment options was given as well. Questions were answered. The patient expresses understanding & wishes to proceed with surgery.   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  Prior onlay polypropylene mesh with holes within the mesh.  Those are the most obvious thinned out areas.  12x8 cm swiss cheese region.  Moderate diastases/hernation in central abdomen 21 x 17 cm region.  Prior left lower quadrant colostomy hernia repair intact with Parietex mesh.  Very dense adhesions of small bowel & colon to mesh.Greater omentum stuck in left upper quadrant.     Type of repair - Laparoscopic underlay repair   Name of mesh -  Bard Ventralight dual sided (polypropylene / Seprafilm)  Size of mesh - Length 33 cm, Width 28 cm  Mesh overlap - 5-7 cm  Placement of mesh - Intraperitoneal underlay  repair   DESCRIPTION:   Informed consent was confirmed. The patient underwent general anaesthesia without  difficulty. The patient was positioned appropriately. VTE prevention in place. The patient's abdomen was clipped, prepped, & draped in a sterile fashion. Surgical timeout confirmed our plan.   The patient was positioned in reverse Trendelenburg. Abdominal entry was gained using optical entry technique in the left upper abdomen. Entry was clean. I induced carbon dioxide insufflation. Camera inspection revealed no injury. Extra ports were carefully placed under direct laparoscopic visualization.   I could see dense adhesions small bowel and transverse colon to the central anterior abdominal wall involving two thirds of the abdomen.  I did laparoscopic lysis of adhesions to expose the entire anterior abdominal wall.  I primarily used and focused cold scissors.   Initially worked around the peripheral regions.  Moderate adhesions to the edges of the prior left lower quadrant prior Tex mesh.  Patient also had herniation of his prior onlay central mesh off the fascia.  This allowed bowel to come up into the preperitoneal and then anterior rectal planes around the periphery superior and inferior to the central online mesh.  There also Swiss cheese hernias centrally, the most obvious area on exam.  The adhesions were very dense.  It took over 2 hours to free all these down. Did meticulous inspection of the intestines during and after takedown.  Saw no evidence of any need for serosal or other injury.    I made sure hemostasis was good.  I mapped out the region using a needle passer.   To ensure that I would have at least 5 cm radial coverage outside of the hernia defect, I chose a 33x28 cm dual sided mesh.  I placed #1 Prolene stitches around its edge about every 5 cm = 18 total.  I rolled the mesh & placed into the peritoneal cavity through a super umbilical hernia fascial defect.  I unrolled the mesh and positioned it appropriately.  We lowered CO2 insufflation down to 10 mm Hg.  I secured the mesh to cover up the  hernia defect using a laparoscopic suture passer to pass the tails of the Prolene through the abdominal wall & tagged them with clamps.  I started out in four corners to make sure I had the mesh centered over the hernia defect appropriately, and then proceeded to work in quadrants.  We evacuated CO2 & desufflated the abdomen.  I tied the fascial stitches down.  I reinsufflated the abdomen.  The mesh provided at least 5-10 cm circumferential coverage around the entire region of hernia defects.   I closed the supraumbilical hernia fascia that I placed the best through using interrupted #1 PDS sutures.  Allowed one of those to go through the mesh to help tack it centrally as well.  I also did #1 proline sutures in the superior & inferior left and right paramedian regions 4.  This help centrally secure the mesh as well, especially to the overlap in the left lower quadrant to the prior Parietex mesh.  I tacked the edges & central part of the mesh to the peritoneum/posterior rectus fascia with  SecureStrap absorbable tacks.   The mesh laid well with minimal buckling. Hemostasis was excellent. . I then placed On-Q catheter sheaths in the preperitoneal plane under direct laparoscopic guidance from the subxiphoid region down to the posterior iliac crests in the lower flanks. I did reinspection. Hemostasis was good. Mesh laid well. The greater omentum was rather foreshortened and  stuck in the left upper quadrant.  I was not able to mobilize it.  Again reinspected the small intestine: Other organs and saw no evidence of injury.  Capnoperitoneum was evacuated. Ports were removed. The skin was closed with Monocryl at the port sites and Steri-Strips on the fascial stitch puncture sites. OnQ catheters were placed and the sheathes peeled away. On-Q pump was secured. Patient is being extubated to go to the recovery room. I updated the status of the patient to the patient's wife.  I made recommendations.  I answered questions.   Understanding & appreciation was expressed.  [name], M.D., F.A.C.S. Gastrointestinal and Minimally Invasive Surgery [facility] [address], [place], [place] [address] [phone] Main / Paging

## 2015-04-23 NOTE — Anesthesia Postprocedure Evaluation (Signed)
  Anesthesia Post-op Note  Patient: Matthew Hodges  Procedure(s) Performed: Procedure(s) (LRB): LAPAROSCOPIC VENTRAL WALL HERNIA REPAIR AND LAP LYSIS OF ADHESIONS (N/A) LYSIS OF ADHESION (N/A)  Patient Location: PACU  Anesthesia Type: General  Level of Consciousness: awake and alert   Airway and Oxygen Therapy: Patient Spontanous Breathing  Post-op Pain: mild  Post-op Assessment: Post-op Vital signs reviewed, Patient's Cardiovascular Status Stable, Respiratory Function Stable, Patent Airway and No signs of Nausea or vomiting  Last Vitals:  Filed Vitals:   04/23/15 1153  BP:   Pulse:   Temp:   Resp: 12    Post-op Vital Signs: stable   Complications: No apparent anesthesia complications

## 2015-04-23 NOTE — Discharge Instructions (Signed)
Patient Education Sheet  °ON-Q Pain Relief System  ° °What is the ON-Q?  °The ON-Q is a balloon-type pump filled with a medicine to treat your pain. It blocks the pain in the area of your procedure. With ON-Q, you may have better pain relief and need less pain medicine. Its small size allows you to move around freely.  °How does the ON-Q work?  °The pump is attached to a catheter (small tube) near your procedure site. The pump automatically pushes the medicine through the tube. DO NOT squeeze the pump. The pump may be clipped to your clothing or dressing or placed in a small carrying case.  ° °How do I know the pump is working?  °The pump gives your medicine very slowly. It may take longer than 24 hours after your procedure to notice a change in the size and look of the pump. Your pump may look like the picture.  °In time, the outside bag on the pump will get looser and wrinkles will form in the bag.  °Talk to your doctor about taking other pain medication as needed.  °Check the following:  °o Make sure the white clamp on the tubing is open (moves freely on the tubing).  °o Make sure there are no kinks in the pump tubing.  °o Do not tape or cover the filter.  ° °What should I do with my ON-Q when sleeping?  °Make sure the pump is placed on a bedside table or on top of bed covers.  °Do not place the pump underneath the bed covers where the pump may become too warm.  °Do not place the pump on the floor or hang the pump on a bedpost.  ° °Can I take a shower with ON-Q?  °Your doctor will tell you when it’s ok for you to shower.  °Take care to protect the catheter site, pump and filter from water.  °Do not submerge the pump in water.  ° °How long will my ON-Q last?  °Depending on the size of your pump, it may take 4-5 days to give you all the medicine. All your medicine has been delivered when the outside bag is flat and a hard tube can be felt in the middle of the pump.  ° °Troubleshooting  °Tubing Disconnection  °If  the pump tubing becomes disconnected from your catheter, Remove all dressings and catheters and throw the On-Q pump away ° °Leaking  °If leaking from the pump or pump tubing occurs, then remove all dressings and catheters and throw the On-Q pump away °  °When should I call the surgeon?  °Be aware that you may experience loss of feeling (numbness) at and around the area of your procedure. If this occurs, be careful not to hurt yourself. Be careful when placing hot or cold items on the numb area.  °The following symptoms may represent a serious medical condition.  °Immediately close the clamp on the pump tubing and call your doctor or 911 in case of emergency.  °Increase in pain  °Fever, chills, sweats  °Bowel or bladder changes  °Difficulty breathing  °Redness, warmth, or discharge or excessive bleeding from the catheter site  °Dizziness or lightheadedness  °Blurred vision  °Ringing or buzzing in your ears  °Metal taste in your mouth  °Numbness or tingling around your mouth, fingers, or toes  °Drowsiness  °Confusion  ° °How to remove the ON-Q catheter?  °If your doctor has instructed you to remove the ON-Q catheter, follow   their instructions keeping in mind these steps:  -Remove the dressing covering the catheter site.  -Remove any skin adhesive strips.  -Grasp the catheter close to the skin and gently pull on the catheter. -It should be easy to remove and not painful.  -If it becomes difficult STOP and call your doctor.  -Do not cut or pull hard to remove the catheter.  -Once removed, check the catheter tip for a black marking to ensure the entire catheter was removed. Call your doctor if you do not see the black marking.  Place bandage over the catheter site as instructed by your doctor.   Source: [employer], ON-Q, and [employer] are registered trademarks and [employer] and [employer] are trademarks of [employer].  2011 [employer]. All right reserved.  10/     HERNIA REPAIR:  POST OP INSTRUCTIONS  1. DIET: Follow a light bland diet the first 24 hours after arrival home, such as soup, liquids, crackers, etc.  Be sure to include lots of fluids daily.  Avoid fast food or heavy meals as your are more likely to get nauseated.  Eat a low fat the next few days after surgery. 2. Take your usually prescribed home medications unless otherwise directed. 3. PAIN CONTROL: a. Pain is best controlled by a usual combination of three different methods TOGETHER: i. Ice/Heat ii. Over the counter pain medication iii. Prescription pain medication b. Most patients will experience some swelling and bruising around the hernia(s) such as the bellybutton, groins, or old incisions.  Ice packs or heating pads (30-60 minutes up to 6 times a day) will help. Use ice for the first few days to help decrease swelling and bruising, then switch to heat to help relax tight/sore spots and speed recovery.  Some people prefer to use ice alone, heat alone, alternating between ice & heat.  Experiment to what works for you.  Swelling and bruising can take several weeks to resolve.   c. It is helpful to take an over-the-counter pain medication regularly for the first few weeks.  Choose one of the following that works best for you: i. Naproxen (Aleve, etc)  Two 220mg  tabs twice a day ii. Ibuprofen (Advil, etc) Three 200mg  tabs four times a day (every meal & bedtime) iii. Acetaminophen (Tylenol, etc) 325-650mg  four times a day (every meal & bedtime) d. A  prescription for pain medication should be given to you upon discharge.  Take your pain medication as prescribed.  i. If you are having problems/concerns with the prescription medicine (does not control pain, nausea, vomiting, rash, itching, etc), please call us [phone] to see if we need to switch you to a different pain medicine that will work better for you and/or control your side effect better. ii. If you need a refill on your pain medication, please  contact your pharmacy.  They will contact our office to request authorization. Prescriptions will not be filled after 5 pm or on week-ends. 4. Avoid getting constipated.  Between the surgery and the pain medications, it is common to experience some constipation.  Increasing fluid intake and taking a fiber supplement (such as Metamucil, Citrucel, FiberCon, MiraLax, etc) 1-2 times a day regularly will usually help prevent this problem from occurring.  A mild laxative (prune juice, Milk of Magnesia, MiraLax, etc) should be taken according to package directions if there are no bowel movements after 48 hours.   5. Wash / shower every day.  You may shower over the dressings as they are  waterproof.   6. Remove your waterproof bandages 5 days after surgery.  You may leave the incision open to air.  You may replace a dressing/Band-Aid to cover the incision for comfort if you wish.  Continue to shower over incision(s) after the dressing is off.    7. ACTIVITIES as tolerated:   a. You may resume regular (light) daily activities beginning the next day--such as daily self-care, walking, climbing stairs--gradually increasing activities as tolerated.  If you can walk 30 minutes without difficulty, it is safe to try more intense activity such as jogging, treadmill, bicycling, low-impact aerobics, swimming, etc. b. Save the most intensive and strenuous activity for last such as sit-ups, heavy lifting, contact sports, etc  Refrain from any heavy lifting or straining until you are off narcotics for pain control.   c. DO NOT PUSH THROUGH PAIN.  Let pain be your guide: If it hurts to do something, don't do it.  Pain is your body warning you to avoid that activity for another week until the pain goes down. d. You may drive when you are no longer taking prescription pain medication, you can comfortably wear a seatbelt, and you can safely maneuver your car and apply brakes. e. Dennis Bast may have sexual intercourse when it is  comfortable.  8. FOLLOW UP in our office a. Please call [facility] at [phone] to set up an appointment to see your surgeon in the office for a follow-up appointment approximately 2-3 weeks after your surgery. b. Make sure that you call for this appointment the day you arrive home to insure a convenient appointment time. 9.  IF YOU HAVE DISABILITY OR FAMILY LEAVE FORMS, BRING THEM TO THE OFFICE FOR PROCESSING.  DO NOT GIVE THEM TO YOUR DOCTOR.  WHEN TO CALL us [phone]: 1. Poor pain control 2. Reactions / problems with new medications (rash/itching, nausea, etc)  3. Fever over 101.5 F (38.5 C) 4. Inability to urinate 5. Nausea and/or vomiting 6. Worsening swelling or bruising 7. Continued bleeding from incision. 8. Increased pain, redness, or drainage from the incision   The clinic staff is available to answer your questions during regular business hours (8:30am-5pm).  Please dont hesitate to call and ask to speak to one of our nurses for clinical concerns.   If you have a medical emergency, go to the nearest emergency room or call 911.  A surgeon from [facility] is always on call at the hospitals in [facility], [place], [place], [place], [place]  [address] ?  [address] [address], [place], [place]   [address] MAIN: [phone] ? TOLL FREE: [phone] ? FAX: [phone] [email or web address]  Managing Pain  Pain after surgery or related to activity is often due to strain/injury to muscle, tendon, nerves and/or incisions.  This pain is usually short-term and will improve in a few months.   Many people find it helpful to do the following things TOGETHER to help speed the process of healing and to get back to regular activity more quickly:  1. Avoid heavy physical activity at first a. No lifting greater than 20 pounds at first, then increase to lifting as tolerated over the next few weeks b. Do not push through the  pain.  Listen to your body and avoid positions and maneuvers than reproduce the pain.  Wait a few days before trying something more intense c. Walking is okay as tolerated, but go slowly and stop when getting sore.  If you can walk 30 minutes without stopping or pain, you can try more intense activity (running, jogging, aerobics, cycling, swimming, treadmill, sex, sports, weightlifting, etc ) d. Remember: If it hurts to do it, then dont do it!  2. Take Anti-inflammatory medication a. Choose ONE of the following over-the-counter medications: i.            Acetaminophen 500mg  tabs (Tylenol) 1-2 pills with every meal and just before bedtime (avoid if you have liver problems) ii.            Naproxen 220mg  tabs (ex. Aleve) 1-2 pills twice a day (avoid if you have kidney, stomach, IBD, or bleeding problems) iii. Ibuprofen 200mg  tabs (ex. Advil, Motrin) 3-4 pills with every meal and just before bedtime (avoid if you have kidney, stomach, IBD, or bleeding problems) b. Take with food/snack around the clock for 1-2 weeks i. This helps the muscle and nerve tissues become less irritable and calm down faster  3. Use a Heating pad or Ice/Cold Pack a. 4-6 times a day b. May use warm bath/hottub  or showers  4. Try Gentle Massage and/or Stretching  a. at the area of pain many times a day b. stop if you feel pain - do not overdo it  Try these steps together to help you body heal faster and avoid making things get worse.  Doing just one of these things may not be enough.    If you are not getting better after two weeks or are noticing you are getting worse, contact our office for further advice; we may need to re-evaluate you & see what other things we can do to help.  GETTING TO GOOD BOWEL HEALTH. Irregular bowel habits such as constipation and diarrhea can lead to many problems over time.  Having one soft bowel movement a day is the most important way to prevent further problems.  The anorectal canal is  designed to handle stretching and feces to safely manage our ability to get rid of solid waste (feces, poop, stool) out of our body.  BUT, hard constipated stools can act like ripping concrete bricks and diarrhea can be a burning fire to this very sensitive area of our body, causing inflamed hemorrhoids, anal fissures, increasing risk is perirectal abscesses, abdominal pain/bloating, an making irritable bowel worse.     The goal: ONE SOFT BOWEL MOVEMENT A DAY!  To have soft, regular bowel movements:   Drink at least 8 tall glasses of water a day.    Take plenty of fiber.  Fiber is the undigested part of plant food that passes into the colon, acting s natures broom to encourage bowel motility and movement.  Fiber can absorb and hold large amounts of water. This results in a larger, bulkier stool, which is soft and easier to pass. Work gradually over several weeks up to 6 servings a day of fiber (25g a day even more if needed) in the form of: o Vegetables -- Root (potatoes, carrots, turnips), leafy green (lettuce, salad greens, celery, spinach), or cooked high residue (cabbage, broccoli, etc) o Fruit -- Fresh (unpeeled skin & pulp), Dried (prunes, apricots, cherries, etc ),  or stewed ( applesauce)  o Whole grain breads, pasta, etc (whole wheat)  o Bran cereals   Bulking Agents -- This type of water-retaining fiber generally is easily obtained each day by one of the following:  o Psyllium bran -- The psyllium plant is remarkable because its ground seeds can retain so much water.  This product is available as Metamucil, Konsyl, Effersyllium, Per Diem Fiber, or the less expensive generic preparation in drug and health food stores. Although labeled a laxative, it really is not a laxative.  o Methylcellulose -- This is another fiber derived from wood which also retains water. It is available as Citrucel. o Polyethylene Glycol - and artificial fiber commonly called Miralax or Glycolax.  It is helpful for  people with gassy or bloated feelings with regular fiber o Flax Seed - a less gassy fiber than psyllium  No reading or other relaxing activity while on the toilet. If bowel movements take longer than 5 minutes, you are too constipated  AVOID CONSTIPATION.  High fiber and water intake usually takes care of this.  Sometimes a laxative is needed to stimulate more frequent bowel movements, but   Laxatives are not a good long-term solution as it can wear the colon out. o Osmotics (Milk of Magnesia, Fleets phosphosoda, Magnesium citrate, MiraLax, GoLytely) are safer than  o Stimulants (Senokot, Castor Oil, Dulcolax, Ex Lax)    o Do not take laxatives for more than 7days in a row.   IF SEVERELY CONSTIPATED, try a Bowel Retraining Program: o Do not use laxatives.  o Eat a diet high in roughage, such as bran cereals and leafy vegetables.  o Drink six (6) ounces of prune or apricot juice each morning.  o Eat two (2) large servings of stewed fruit each day.  o Take one (1) heaping tablespoon of a psyllium-based bulking agent twice a day. Use sugar-free sweetener when possible to avoid excessive calories.  o Eat a normal breakfast.  o Set aside 15 minutes after breakfast to sit on the toilet, but do not strain to have a bowel movement.  o If you do not have a bowel movement by the third day, use an enema and repeat the above steps.   Controlling diarrhea o Switch to liquids and simpler foods for a few days to avoid stressing your intestines further. o Avoid dairy products (especially milk & ice cream) for a short time.  The intestines often can lose the ability to digest lactose when stressed. o Avoid foods that cause gassiness or bloating.  Typical foods include beans and other legumes, cabbage, broccoli, and dairy foods.  Every person has some sensitivity to other foods, so listen to our body and avoid those foods that trigger problems for you. o Adding fiber (Citrucel, Metamucil, psyllium, Miralax)  gradually can help thicken stools by absorbing excess fluid and retrain the intestines to act more normally.  Slowly increase the dose over a few weeks.  Too much fiber too soon can backfire and cause cramping & bloating. o Probiotics (such as active yogurt, Align, etc) may help repopulate the intestines and colon with normal bacteria and calm down a sensitive digestive tract.  Most studies show it to be of mild help, though, and such products can be costly. o Medicines: - Bismuth subsalicylate (ex. Kayopectate, Pepto Bismol) every 30 minutes for up to 6 doses can help control diarrhea.  Avoid if pregnant. - Loperamide (Immodium) can slow down diarrhea.  Start with two tablets (4mg  total) first and then try one tablet every 6 hours.  Avoid if you are having fevers or severe pain.  If you are not better or start feeling worse, stop all medicines and call your doctor for advice o Call your doctor if you are getting worse or not better.  Sometimes further testing (cultures, endoscopy, X-ray studies,  bloodwork, etc) may be needed to help diagnose and treat the cause of the diarrhea.  Hernia Repair with Laparoscope A hernia occurs when an internal organ pushes out through a weak spot in the belly (abdominal) wall muscles. Hernias most commonly occur in the groin and around the navel. Hernias can also occur through a cut by the surgeon (incision) after an abdominal operation. A hernia may be caused by:  Lifting heavy objects.  Prolonged coughing.  Straining to move your bowels. Hernias can often be pushed back into place (reduced). Most hernias tend to get worse over time. Problems occur when abdominal contents get stuck in the opening and the blood supply is blocked or impaired (incarcerated hernia). Because of these risks, you require surgery to repair the hernia. Your hernia will be repaired using a laparoscope. Laparoscopic surgery is a type of minimally invasive surgery. It does not involve making a  typical surgical cut (incision) in the skin. A laparoscope is a telescope-like rod and lens system. It is usually connected to a video camera and a light source so your caregiver can clearly see the operative area. The instruments are inserted through  to  inch (5 mm or 10 mm) openings in the skin at specific locations. A working and viewing space is created by blowing a small amount of carbon dioxide gas into the abdominal cavity. The abdomen is essentially blown up like a balloon (insufflated). This elevates the abdominal wall above the internal organs like a dome. The carbon dioxide gas is common to the human body and can be absorbed by tissue and removed by the respiratory system. Once the repair is completed, the small incisions will be closed with either stitches (sutures) or staples (just like a paper stapler only this staple holds the skin together). LET YOUR CAREGIVERS KNOW ABOUT:  Allergies.  Medications taken including herbs, eye drops, over the counter medications, and creams.  Use of steroids (by mouth or creams).  Previous problems with anesthetics or Novocaine.  Possibility of pregnancy, if this applies.  History of blood clots (thrombophlebitis).  History of bleeding or blood problems.  Previous surgery.  Other health problems. BEFORE THE PROCEDURE  Laparoscopy can be done either in a hospital or out-patient clinic. You may be given a mild sedative to help you relax before the procedure. Once in the operating room, you will be given a general anesthesia to make you sleep (unless you and your caregiver choose a different anesthetic).  AFTER THE PROCEDURE  After the procedure you will be watched in a recovery area. Depending on what type of hernia was repaired, you might be admitted to the hospital or you might go home the same day. With this procedure you may have less pain and scarring. This usually results in a quicker recovery and less risk of infection. HOME CARE  INSTRUCTIONS   Bed rest is not required. You may continue your normal activities but avoid heavy lifting (more than 10 pounds) or straining.  Cough gently. If you are a smoker it is best to stop, as even the best hernia repair can break down with the continual strain of coughing.  Avoid driving until given the OK by your surgeon.  There are no dietary restrictions unless given otherwise.  TAKE ALL MEDICATIONS AS DIRECTED.  Only take over-the-counter or prescription medicines for pain, discomfort, or fever as directed by your caregiver. SEEK MEDICAL CARE IF:   There is increasing abdominal pain or pain in your incisions.  There is  more bleeding from incisions, other than minimal spotting.  You feel light headed or faint.  You develop an unexplained fever, chills, and/or an oral temperature above 102 F (38.9 C).  You have redness, swelling, or increasing pain in the wound.  Pus coming from wound.  A foul smell coming from the wound or dressings. SEEK IMMEDIATE MEDICAL CARE IF:   You develop a rash.  You have difficulty breathing.  You have any allergic problems. MAKE SURE YOU:   Understand these instructions.  Will watch your condition.  Will get help right away if you are not doing well or get worse. Document Released: 11/16/2005 Document Revised: 02/08/2012 Document Reviewed: 10/16/2009 [facility] Patient Information 2015 [place], [place]. This information is not intended to replace advice given to you by your health care provider. Make sure you discuss any questions you have with your health care provider.

## 2015-04-23 NOTE — H&P (Signed)
Matthew Hodges 03/05/2015 8:50 AM Location: [facility] Patient #: [id number] DOB: [birth year] Married / Language: Matthew Hodges / Race: White Male  History of Present Illness Matthew Hector MD; 03/05/2015 9:37 AM) Patient words: hernia.  The patient is a 74 year old male who presents with an incisional hernia. Patient sent by his primary care physician, Dr. Darcus Hodges and [facility] at [facility]. Concern for recurrent hernia Pleasant obese male. Developed perforated diverticulitis & required emergency open colectomy/colostomy March 2000. Postop abscesses and fistula drained/repaired April 2000. Recovered. Colostomy takedown October 2000. Developed incisional hernias. Open repair and mesh 2001. All by Dr Matthew Hodges. Developed hernia at old colostomy site. I became involved. I did laparoscopic repair in 2008. Recovered. Developed recurrent stitch abscesses had midline and old colostomy site. I removed these areas in 2011. Mesh did not get infected. He recovered. He's had no surgery since. No wound problems since. He did have an episode of bowel obstruction in 2012. CT scan noted midline hernias. Recovered quickly. No surgery. None since. Patient however is concerned that the hernias were more obvious now. Occasional discomfort but no severe pain. Eating well. No bad bouts of constipation or diarrhea. No nausea or vomiting. Refuses to get colonoscopies. No major anemia or rectal bleeding. Can walk 20 minutes without difficulty. Wished to consider hernia repair to avoid future problems.   Other Problems Matthew Hodges, CMA; 03/05/2015 8:50 AM) Alcohol Abuse Bladder Problems Cerebrovascular Accident Hypercholesterolemia Melanoma Prostate Cancer  Past Surgical History Matthew Hector, MD; 03/05/2015 9:27 AM) Colon Removal - Partial Resection of Stomach LAPAROSCOPIC REPAIR, INCISIONAL HERNIA, REDUCIBLE [phone] PHYSICIAN: Matthew Hector, MD DATE OF BIRTH:  09/20/1941  DATE OF PROCEDURE: 09/02/2007 DATE OF DISCHARGE:   OPERATIVE REPORT  PRIMARY CARE PHYSICIAN: Matthew Hodges, M.D.  SURGEON: Matthew Hodges, M.D.  ASSISTANT: Matthew Hodges. Matthew Hodges, M.D., as well as Matthew Hodges, P.A. student.  PREOPERATIVE DIAGNOSIS: History of prior abdominal surgeries and ventral hernia with paracolostomy hernia, possible midline ventral hernia.  POSTOPERATIVE DIAGNOSES: 1. Left lower quadrant incarcerated ventral hernia from old colostomy  site, 9 x 14 cm in size. 2. Extensive abdominal wall adhesions to anterior abdominal wall. 3. No evidence of any significant ventral hernia.  PROCEDURE PERFORMED: 1. Laparoscopic lysis of adhesions x150 minutes (equals 75%). 2. Laparoscopic ventral hernia repair with 20 x 30 cm  Parietex/Seprafilm dual-sided mesh. OPERATIVE FINDINGS: He had very dense adhesions onto his midline involving numerous loops of small intestine and omentum. He had numerous adhesions on his left lateral abdomen as well of omentum along with incarcerated hernia through his colostomy site about 9 x 14 cm in size with omentum and small intestine within it. He had no strong evidence of a midline incisional hernia with prior mesh intact. REMOVAL, SUTURE, WITH ANESTHESIA [phone] Removal of stitch abscesses at old LLQ colostomy site and midline COLECTOMY WITH END COLOSTOMY (44143)01/1999 Perf diverticulitis COLOSTOMY TAKEDOWN (44620)08/1999 Dr Matthew Hodges OPEN REPAIR, HERNIA, ABDOMINAL (49560)02/2000 Open repair of midline & LLQ hwernias with mesh. Dr Matthew Hodges EXPLORATORY LAPAROTOMY (49000)03/1999 Exploratory laparotomy, drainage of abscesses, repair small bowel fistula.  Diagnostic Studies History Matthew Hodges, CMA; 03/05/2015 8:50 AM) Colonoscopy >10 years ago  Allergies Matthew Hodges, CMA; 03/05/2015 8:51 AM) Latex Exam Gloves *MEDICAL DEVICES AND SUPPLIES*  Medication History Matthew Hodges, CMA;  03/05/2015 8:52 AM) Aspirin EC (81MG  Tablet DR, Oral) Active. Atorvastatin Calcium (10MG  Tablet, Oral) Active. Vitamin D (400UNIT Capsule, Oral) Active. Multivitamin/Fluoride (0.25MG  Tablet Chewable, Oral) Active. Lovaza (1GM Capsule, Oral) Active. Viagra (100MG  Tablet,  Oral) Active. Medications Reconciled  Social History Matthew Hodges, CMA; 03/05/2015 8:50 AM) Alcohol use Remotely quit alcohol use. Caffeine use Coffee. No drug use Tobacco use Former smoker.  Family History Matthew Hodges, CMA; 03/05/2015 8:50 AM) Alcohol Abuse Brother, Father, Sister. Depression Sister.  Review of Systems Matthew Hodges CMA; 03/05/2015 8:50 AM) General Not Present- Appetite Loss, Chills, Fatigue, Fever, Night Sweats, Weight Gain and Weight Loss. Skin Not Present- Change in Wart/Mole, Dryness, Hives, Jaundice, New Lesions, Non-Healing Wounds, Rash and Ulcer. HEENT Not Present- Earache, Hearing Loss, Hoarseness, Nose Bleed, Oral Ulcers, Ringing in the Ears, Seasonal Allergies, Sinus Pain, Sore Throat, Visual Disturbances, Wears glasses/contact lenses and Yellow Eyes. Respiratory Present- Snoring. Not Present- Bloody sputum, Chronic Cough, Difficulty Breathing and Wheezing. Breast Not Present- Breast Mass, Breast Pain, Nipple Discharge and Skin Changes. Cardiovascular Not Present- Chest Pain, Difficulty Breathing Lying Down, Leg Cramps, Palpitations, Rapid Heart Rate, Shortness of Breath and Swelling of Extremities. Gastrointestinal Not Present- Abdominal Pain, Bloating, Bloody Stool, Change in Bowel Habits, Chronic diarrhea, Constipation, Difficulty Swallowing, Excessive gas, Gets full quickly at meals, Hemorrhoids, Indigestion, Nausea, Rectal Pain and Vomiting. Male Genitourinary Present- Frequency, Impotence and Nocturia. Not Present- Blood in Urine, Change in Urinary Stream, Painful Urination, Urgency and Urine Leakage. Musculoskeletal Not Present- Back Pain, Joint Pain, Joint Stiffness, Muscle Pain,  Muscle Weakness and Swelling of Extremities. Neurological Not Present- Decreased Memory, Fainting, Headaches, Numbness, Seizures, Tingling, Tremor, Trouble walking and Weakness. Psychiatric Not Present- Anxiety, Bipolar, Change in Sleep Pattern, Depression, Fearful and Frequent crying. Endocrine Not Present- Cold Intolerance, Excessive Hunger, Hair Changes, Heat Intolerance, Hot flashes and New Diabetes. Hematology Present- Easy Bruising. Not Present- Excessive bleeding, Gland problems, HIV and Persistent Infections.   Vitals (Sonya Hodges CMA; 03/05/2015 8:51 AM) 03/05/2015 8:50 AM Weight: 198 lb Height: 68in Body Surface Area: 2.08 m Body Mass Index: 30.11 kg/m Temp.: 97.52F(Temporal)  Pulse: 58 (Regular)  BP: 132/80 (Sitting, Left Arm, Standard)    Physical Exam Matthew Hector MD; 03/05/2015 9:28 AM) General Mental Status-Alert. General Appearance-Not in acute distress, Not Sickly. Orientation-Oriented X3. Hydration-Well hydrated. Voice-Normal.  Integumentary Global Assessment Upon inspection and palpation of skin surfaces of the - Axillae: non-tender, no inflammation or ulceration, no drainage. and Distribution of scalp and body hair is normal. General Characteristics Temperature - normal warmth is noted.  Head and Neck Head-normocephalic, atraumatic with no lesions or palpable masses. Face Global Assessment - atraumatic, no absence of expression. Neck Global Assessment - no abnormal movements, no bruit auscultated on the right, no bruit auscultated on the left, no decreased range of motion, non-tender. Trachea-midline. Thyroid Gland Characteristics - non-tender.  Eye Eyeball - Left-Extraocular movements intact, No Nystagmus. Eyeball - Right-Extraocular movements intact, No Nystagmus. Cornea - Left-No Hazy. Cornea - Right-No Hazy. Sclera/Conjunctiva - Left-No scleral icterus, No Discharge. Sclera/Conjunctiva - Right-No scleral  icterus, No Discharge. Pupil - Left-Direct reaction to light normal. Pupil - Right-Direct reaction to light normal.  ENMT Ears Pinna - Left - no drainage observed, no generalized tenderness observed. Right - no drainage observed, no generalized tenderness observed. Nose and Sinuses External Inspection of the Nose - no destructive lesion observed. Inspection of the nares - Left - quiet respiration. Right - quiet respiration. Mouth and Throat Lips - Upper Lip - no fissures observed, no pallor noted. Lower Lip - no fissures observed, no pallor noted. Nasopharynx - no discharge present. Oral Cavity/Oropharynx - Tongue - no dryness observed. Oral Mucosa - no cyanosis observed. Hypopharynx - no evidence of airway  distress observed.  Chest and Lung Exam Inspection Movements - Normal and Symmetrical. Accessory muscles - No use of accessory muscles in breathing. Palpation Palpation of the chest reveals - Non-tender. Auscultation Breath sounds - Normal and Clear.  Cardiovascular Auscultation Rhythm - Regular. Murmurs & Other Heart Sounds - Auscultation of the heart reveals - No Murmurs and No Systolic Clicks.  Abdomen Inspection Inspection of the abdomen reveals - No Visible peristalsis and No Abnormal pulsations. Umbilicus - No Bleeding, No Urine drainage. Palpation/Percussion Palpation and Percussion of the abdomen reveal - Soft, Non Tender, No Rebound tenderness, No Rigidity (guarding) and No Cutaneous hyperesthesia. Note: Obese but doughy soft. Periumbilical midline Swiss cheese hernias. No LEFT lower quadrant recurrent hernia colostomy. No definite inguinal or epigastric hernias.    Male Genitourinary Sexual Maturity Tanner 5 - Adult hair pattern and Adult penile size and shape.  Peripheral Vascular Upper Extremity Inspection - Left - No Cyanotic nailbeds, Not Ischemic. Right - No Cyanotic nailbeds, Not Ischemic.  Neurologic Neurologic evaluation reveals -normal attention  span and ability to concentrate, able to name objects and repeat phrases. Appropriate fund of knowledge , normal sensation and normal coordination. Mental Status Affect - not angry, not paranoid. Cranial Nerves-Normal Bilaterally. Gait-Normal.  Neuropsychiatric Mental status exam performed with findings of-able to articulate well with normal speech/language, rate, volume and coherence, thought content normal with ability to perform basic computations and apply abstract reasoning and no evidence of hallucinations, delusions, obsessions or homicidal/suicidal ideation.  Musculoskeletal Global Assessment Spine, Ribs and Pelvis - no instability, subluxation or laxity. Right Upper Extremity - no instability, subluxation or laxity.  Lymphatic Head & Neck  General Head & Neck Lymphatics: Bilateral - Description - No Localized lymphadenopathy. Axillary  General Axillary Region: Bilateral - Description - No Localized lymphadenopathy. Femoral & Inguinal  Generalized Femoral & Inguinal Lymphatics: Left - Description - No Localized lymphadenopathy. Right - Description - No Localized lymphadenopathy.    Assessment & Plan  RECURRENT VENTRAL INCISIONAL HERNIA (553.21  K43.2) Impression: They are not horribly symptomatic but they have gotten larger. He is had problems with a bowel obstruction since his last surgery in 2011. He is concerned about having more abdominal problems. The patient is wife are interested in proceeding with surgery.  His reasonable to do a laparoscopic approach. I think his risk of bowel injury / fistula /abscess or leak is definitely higher. However, he tolerated the repair of his LEFT lower quadrant colostomy hernia laparoscopically without major event and has no recurrence. No infections in 5 years. Reasonable for laparoscopic approach. He is already had an onlay open repair, so different layer intraperitoneal would be a good idea. Current Plans  Schedule for  Surgery Discussed regular exercise with patient. The anatomy & physiology of the abdominal wall was discussed. The pathophysiology of hernias was discussed. Natural history risks without surgery including progeressive enlargement, pain, incarceration, & strangulation was discussed. Contributors to complications such as smoking, obesity, diabetes, prior surgery, etc were discussed.  I feel the risks of no intervention will lead to serious problems that outweigh the operative risks; therefore, I recommended surgery to reduce and repair the hernia. I explained laparoscopic techniques with possible need for an open approach. I noted the probable use of mesh to patch and/or buttress the hernia repair  Risks such as bleeding, infection, abscess, need for further treatment, heart attack, death, and other risks were discussed. I noted a good likelihood this will help address the problem. Goals of post-operative recovery were discussed  as well. Possibility that this will not correct all symptoms was explained. I stressed the importance of low-impact activity, aggressive pain control, avoiding constipation, & not pushing through pain to minimize risk of post-operative chronic pain or injury. Possibility of reherniation especially with smoking, obesity, diabetes, immunosuppression, and other health conditions was discussed. We will work to minimize complications.  An educational handout further explaining the pathology & treatment options was given as well. Questions were answered. The patient expresses understanding & wishes to proceed with surgery. Pt Education - [facility] Hernia Post-Op HCI (Iza Preston): discussed with patient and provided information. Pt Education - [facility] Pain Control (Jamol Ginyard) Pt Education - [facility] Good Bowel Health (Leinaala Catanese)  Matthew Hodges, M.D., F.A.C.S. Gastrointestinal and Minimally Invasive Surgery [facility] [address] [place], [place] [address] [phone]  Main / Paging

## 2015-04-23 NOTE — Transfer of Care (Signed)
Immediate Anesthesia Transfer of Care Note  Patient: Matthew Hodges  Procedure(s) Performed: Procedure(s): LAPAROSCOPIC VENTRAL WALL HERNIA REPAIR AND LAP LYSIS OF ADHESIONS (N/A) LYSIS OF ADHESION (N/A)  Patient Location: PACU  Anesthesia Type:General  Level of Consciousness: awake and sedated  Airway & Oxygen Therapy: Patient Spontanous Breathing and Patient connected to face mask oxygen  Post-op Assessment: Report given to RN and Post -op Vital signs reviewed and stable  Post vital signs: Reviewed and stable  Last Vitals:  Filed Vitals:   04/23/15 0824  BP:   Pulse:   Temp:   Resp: 11    Complications: No apparent anesthesia complications

## 2015-04-24 ENCOUNTER — Encounter (HOSPITAL_COMMUNITY): Payer: Self-pay | Admitting: Surgery

## 2015-04-24 DIAGNOSIS — K432 Incisional hernia without obstruction or gangrene: Secondary | ICD-10-CM | POA: Diagnosis not present

## 2015-04-24 LAB — BASIC METABOLIC PANEL
Anion gap: 9 (ref 5–15)
BUN: 18 mg/dL (ref 6–20)
CHLORIDE: 103 mmol/L (ref 101–111)
CO2: 25 mmol/L (ref 22–32)
Calcium: 8.3 mg/dL — ABNORMAL LOW (ref 8.9–10.3)
Creatinine, Ser: 0.98 mg/dL (ref 0.61–1.24)
GFR calc non Af Amer: >60 mL/min (ref >60)
Glucose, Bld: 123 mg/dL — ABNORMAL HIGH (ref 65–99)
POTASSIUM: 4.2 mmol/L (ref 3.5–5.1)
SODIUM: 137 mmol/L (ref 135–145)

## 2015-04-24 LAB — CBC
HEMATOCRIT: 37.3 % — AB (ref 39.0–52.0)
Hemoglobin: 12.1 g/dL — ABNORMAL LOW (ref 13.0–17.0)
MCH: 30.3 pg (ref 26.0–34.0)
MCHC: 32.4 g/dL (ref 30.0–36.0)
MCV: 93.5 fL (ref 78.0–100.0)
PLATELETS: 191 10*3/uL (ref 150–400)
RBC: 3.99 MIL/uL — ABNORMAL LOW (ref 4.22–5.81)
RDW: 13.3 % (ref 11.5–15.5)
WBC: 7.7 10*3/uL (ref 4.0–10.5)

## 2015-04-24 MED ORDER — OXYCODONE HCL 5 MG PO TABS: 5.0000 mg | ORAL_TABLET | ORAL | Status: DC | PRN

## 2015-04-24 NOTE — Discharge Summary (Signed)
Physician Discharge Summary  Patient ID: Matthew Hodges MRN: [id number] DOB/AGE: [birth year] 74 y.o.  Admit date: 04/23/2015 Discharge date: 04/24/2015  Patient Care Team: Darcus Austin, MD as PCP - General (Family Medicine) Druscilla Brownie, MD (Dermatology) Carolan Clines, MD as Attending Physician (Urology)  Admission Diagnoses: Principal Problem:   Recurrent ventral incisional hernia   Discharge Diagnoses:  Principal Problem:   Recurrent ventral incisional hernia  POST-OPERATIVE DIAGNOSIS: Incisonal Ventral Wall Abdominal Hernia AND ADHESIONS  PROCEDURE: LAPAROSCOPIC VENTRAL [place] with mesh LAPAROSCOPIC LYSIS OF ADHESIONS X 2 HOURS (66% of case)  SURGEON:  Surgeon(s): Michael Boston, MD  Consults: None  Hospital Course:   The patient underwent the surgery above.  Postoperatively, the patient gradually mobilized and advanced to a solid diet.  Pain and other symptoms were treated aggressively.    By the time of discharge, the patient was walking well the hallways, eating food, having flatus.  Pain was well-controlled on an oral medications.  Based on meeting discharge criteria and continuing to recover, I felt it was safe for the patient to be discharged from the hospital to further recover with close followup. Postoperative recommendations were discussed in detail.  They are written as well.   Significant Diagnostic Studies:  Results for orders placed or performed during the hospital encounter of 04/23/15 (from the past 72 hour(s))  Basic metabolic panel     Status: Abnormal   Collection Time: 04/24/15  4:36 AM  Result Value Ref Range   Sodium 137 135 - 145 mmol/L   Potassium 4.2 3.5 - 5.1 mmol/L   Chloride 103 101 - 111 mmol/L   CO2 25 22 - 32 mmol/L   Glucose, Bld 123 (H) 65 - 99 mg/dL   BUN 18 6 - 20 mg/dL   Creatinine, Ser 0.98 0.61 - 1.24 mg/dL   Calcium 8.3 (L) 8.9 - 10.3 mg/dL   GFR calc non Af Amer >60 >60 mL/min   GFR calc Af Amer >60  >60 mL/min    Comment: (NOTE) The eGFR has been calculated using the CKD EPI equation. This calculation has not been validated in all clinical situations. eGFR's persistently <60 mL/min signify possible Chronic Kidney Disease.    Anion gap 9 5 - 15  CBC     Status: Abnormal   Collection Time: 04/24/15  4:36 AM  Result Value Ref Range   WBC 7.7 4.0 - 10.5 K/uL   RBC 3.99 (L) 4.22 - 5.81 MIL/uL   Hemoglobin 12.1 (L) 13.0 - 17.0 g/dL   HCT 37.3 (L) 39.0 - 52.0 %   MCV 93.5 78.0 - 100.0 fL   MCH 30.3 26.0 - 34.0 pg   MCHC 32.4 30.0 - 36.0 g/dL   RDW 13.3 11.5 - 15.5 %   Platelets 191 150 - 400 K/uL    No results found.  Discharge Exam: Blood pressure 126/56, pulse 72, temperature 98.4 F (36.9 C), temperature source Oral, resp. rate 16, height 5' 8.5" (1.74 m), weight 90.266 kg (199 lb), SpO2 94 %.  General: Pt awake/alert/oriented x4 in no major acute distress.  Calm Eyes: PERRL, normal EOM. Sclera nonicteric Neuro: CN II-XII intact w/o focal sensory/motor deficits. Lymph: No head/neck/groin lymphadenopathy Psych:  No delerium/psychosis/paranoia HENT: Normocephalic, Mucus membranes moist.  No thrush Neck: Supple, No tracheal deviation Chest: No pain.  Good respiratory excursion. CV:  Pulses intact.  Regular rhythm MS: Normal AROM mjr joints.  No obvious deformity Abdomen: Soft, Nondistended.  Mildy tender at lateral abdomen.  OnQ in place.  No incarcerated hernias. Ext:  SCDs BLE.  No significant edema.  No cyanosis Skin: No petechiae / purpura  Discharged Condition: good   Past Medical History  Diagnosis Date  . Cerebral artery disease   . Diverticular disease   . Hyperlipidemia   . Heart murmur   . Hypertension     hx. of . no meds since 2004  . Stroke     in 1979, no deficits  . Cancer     history prostate cancer treated with radiation and skin cancer    Past Surgical History  Procedure Laterality Date  . Partial colectomy  01/1999    Hartmans procedure  with colostomy  . Small bowel repair  03/1999    postop abscesses with fistula.  Abscesses drained.  Fistula repaired.  . Colostomy takedown  08/1999  . Incisional hernia repair  2001    open w mesh  . Laparoscopic incisional / umbilical / ventral hernia repair  2008    with mesh.  LLQ old colostomy site  . Remove suture under anesthesia  2011    stitch abscesses LLQ & midline    History   Social History  . Marital Status: Married    Spouse Name: N/A  . Number of Children: N/A  . Years of Education: N/A   Occupational History  . Not on file.   Social History Main Topics  . Smoking status: Former Smoker    Types: Cigarettes    Quit date: 11/30/2004  . Smokeless tobacco: Never Used  . Alcohol Use: No     Comment: recovering alcoholic sober since 0960  . Drug Use: No  . Sexual Activity: Not on file   Other Topics Concern  . Not on file   Social History Narrative    History reviewed. No pertinent family history.  Current Facility-Administered Medications  Medication Dose Route Frequency Provider Last Rate Last Dose  . 0.9 %  sodium chloride infusion  250 mL Intravenous PRN Michael Boston, MD      . acetaminophen (TYLENOL) suppository 650 mg  650 mg Rectal Q6H PRN Michael Boston, MD      . acetaminophen (TYLENOL) tablet 325-650 mg  325-650 mg Oral Q6H PRN Michael Boston, MD   650 mg at 04/24/15 4540  . alum & mag hydroxide-simeth (MAALOX/MYLANTA) 200-200-20 MG/5ML suspension 30 mL  30 mL Oral Q6H PRN Michael Boston, MD      . aspirin EC tablet 81 mg  81 mg Oral q morning - 10a Michael Boston, MD   81 mg at 04/23/15 1745  . atorvastatin (LIPITOR) tablet 20 mg  20 mg Oral QHS Michael Boston, MD   20 mg at 04/23/15 2147  . bupivacaine 0.25 % ON-Q pump DUAL CATH 300 mL  300 mL Other Continuous Michael Boston, MD      . diphenhydrAMINE (BENADRYL) injection 12.5-25 mg  12.5-25 mg Intravenous Q6H PRN Michael Boston, MD   12.5 mg at 04/23/15 2220  . heparin injection 5,000 Units  5,000 Units  Subcutaneous 3 times per day Michael Boston, MD   5,000 Units at 04/24/15 0531  . HYDROmorphone (DILAUDID) injection 0.5-2 mg  0.5-2 mg Intravenous Q30 min PRN Michael Boston, MD      . ibuprofen (ADVIL,MOTRIN) tablet 800 mg  800 mg Oral QID Michael Boston, MD   800 mg at 04/23/15 2147  . lactated ringers bolus 1,000 mL  1,000 mL Intravenous Q8H PRN Michael Boston, MD      .  lip balm (CARMEX) ointment 1 application  1 application Topical BID Michael Boston, MD   1 application at 92/44/62 2148  . magic mouthwash  15 mL Oral QID PRN Michael Boston, MD      . menthol-cetylpyridinium (CEPACOL) lozenge 3 mg  1 lozenge Oral PRN Michael Boston, MD      . mirabegron ER [facility]) tablet 25 mg  25 mg Oral q morning - 10a Michael Boston, MD   25 mg at 04/23/15 1745  . omega-3 acid ethyl esters (LOVAZA) capsule 1 g  1 g Oral q morning - 10a Michael Boston, MD   1 g at 04/23/15 1745  . oxyCODONE (Oxy IR/ROXICODONE) immediate release tablet 5-10 mg  5-10 mg Oral Q4H PRN Michael Boston, MD      . phenol (CHLORASEPTIC) mouth spray 2 spray  2 spray Mouth/Throat PRN Michael Boston, MD      . promethazine (PHENERGAN) injection 6.25-12.5 mg  6.25-12.5 mg Intravenous Q4H PRN Michael Boston, MD      . sodium chloride 0.9 % injection 3 mL  3 mL Intravenous Q12H Michael Boston, MD   3 mL at 04/23/15 1600  . sodium chloride 0.9 % injection 3 mL  3 mL Intravenous PRN Michael Boston, MD         Allergies  Allergen Reactions  . Latex Rash    Disposition: 01-Home or Self Care  Discharge Instructions    Call MD for:  extreme fatigue    Complete by:  As directed      Call MD for:  hives    Complete by:  As directed      Call MD for:  persistant nausea and vomiting    Complete by:  As directed      Call MD for:  redness, tenderness, or signs of infection (pain, swelling, redness, odor or green/yellow discharge around incision site)    Complete by:  As directed      Call MD for:  severe uncontrolled pain    Complete by:  As directed       Call MD for:    Complete by:  As directed   Temperature > 101.49F     Diet - low sodium heart healthy    Complete by:  As directed      Discharge instructions    Complete by:  As directed   Please see discharge instruction sheets.  Also refer to handout given an office.  Please call our office if you have any questions or concerns [phone]     Discharge wound care:    Complete by:  As directed   You have closed incisions, shower and bathe over these incisions with soap and water every day.  Remove all surgical dressings on postoperative day #3, Friday.  You do not need to replace dressings over the closed incisions unless you feel more comfortable with a Band-Aid covering it.   -Teach care of the OnQ pump & catheters to patient & family.  They should check Check On-Q pump ball daily.  It should fully collapse  in ~3-4 days. -Anticipate removal of OnQ pump & all dressings on Friday (at home) 5/27 when OnQ pump collapsed -When fully collapsed to the size of a small cylinder (like a lipstick tube), remove dressings, brown thin catheters, and rest of On-Q pump from the patient.  Cover skin with dry dressing as needed   Please call our office [phone] if you have further questions.  Driving Restrictions    Complete by:  As directed   No driving until off narcotics and can safely swerve away without pain during an emergency     Increase activity slowly    Complete by:  As directed   Walk an hour a day.  Use 20-30 minute walks.  When you can walk 30 minutes without difficulty, increase to low impact/moderate activities such as biking, jogging, swimming, sexual activity..  Eventually can increase to unrestricted activity when not feeling pain.  If you feel pain: STOP!Marland Kitchen   Let pain protect you from overdoing it.  Use ice/heat/over-the-counter pain medications to help minimize his soreness.  Use pain prescriptions as needed to remain active.  It is better to take extra pain medications and be  more active than to stay bedridden to avoid all pain medications.     Lifting restrictions    Complete by:  As directed   Avoid heavy lifting initially.  Do not push through pain.  You have no specific weight limit.  Coughing and sneezing or four more stressful to your incision than any lifting you will do. Pain will protect you from injury.  Therefore, avoid intense activity until off all narcotic pain medications.  Coughing and sneezing or four more stressful to your incision than any lifting he will do.     May shower / Bathe    Complete by:  As directed      May walk up steps    Complete by:  As directed      Sexual Activity Restrictions    Complete by:  As directed   Sexual activity as tolerated.  Do not push through pain.  Pain will protect you from injury.     Walk with assistance    Complete by:  As directed   Walk over an hour a day.  May use a walker/cane/companion to help with balance and stamina.            Medication List    TAKE these medications        aspirin EC 81 MG tablet  Take 81 mg by mouth every morning.     atorvastatin 40 MG tablet  Commonly known as:  LIPITOR  Take 20 mg by mouth at bedtime.     ibuprofen 200 MG tablet  Commonly known as:  ADVIL,MOTRIN  Take 400 mg by mouth every 6 (six) hours as needed for headache or moderate pain.     MEGA MULTIVITAMIN FOR MEN PO  Take 1 tablet by mouth every morning.     mirabegron ER 25 MG Tb24 tablet  Commonly known as:  MYRBETRIQ  Take 25 mg by mouth every morning.     omega-3 acid ethyl esters 1 G capsule  Commonly known as:  LOVAZA  Take 1 g by mouth every morning.     oxyCODONE 5 MG immediate release tablet  Commonly known as:  Oxy IR/ROXICODONE  Take 1-2 tablets (5-10 mg total) by mouth every 6 (six) hours as needed for moderate pain, severe pain or breakthrough pain.     sildenafil 100 MG tablet  Commonly known as:  VIAGRA  Take 100 mg by mouth daily as needed for erectile dysfunction.      Vitamin D3 400 UNITS tablet  Take 400 Units by mouth every morning.           Follow-up Information    Follow up with Jasean Ambrosia C., MD In 3 weeks.   Specialty:  General  Surgery   Why:  To follow up after your operation, To follow up after your hospital stay   Contact information:   [place] [address] [phone]        Signed: Morton Peters, M.D., F.A.C.S. Gastrointestinal and Minimally Invasive Surgery [facility] [address] [place],  [address] [phone] Main / Paging   04/24/2015, 7:28 AM

## 2015-04-24 NOTE — Progress Notes (Signed)
Patient alert, oriented, and stable, pain appropriately controlled. Discharge instructions given and teaching on OnQ completed. Patient verbalized understanding of instructions and diet order. All questions and concerns were answered. Patient left floor via wheelchair.  Cristine Polio 04/24/2015 1:07 PM

## 2015-09-12 ENCOUNTER — Encounter (HOSPITAL_COMMUNITY): Payer: Self-pay | Admitting: Emergency Medicine

## 2015-09-12 ENCOUNTER — Emergency Department (HOSPITAL_COMMUNITY): Payer: Medicare Other

## 2015-09-12 ENCOUNTER — Inpatient Hospital Stay (HOSPITAL_COMMUNITY)
Admission: EM | Admit: 2015-09-12 | Discharge: 2015-09-13 | DRG: 390 | Disposition: A | Discharge status: Home / Self Care | Payer: Medicare Other | Attending: General Surgery | Admitting: General Surgery

## 2015-09-12 DIAGNOSIS — E785 Hyperlipidemia, unspecified: Secondary | ICD-10-CM | POA: Diagnosis present

## 2015-09-12 DIAGNOSIS — Z8673 Personal history of transient ischemic attack (TIA), and cerebral infarction without residual deficits: Secondary | ICD-10-CM

## 2015-09-12 DIAGNOSIS — Z923 Personal history of irradiation: Secondary | ICD-10-CM | POA: Diagnosis not present

## 2015-09-12 DIAGNOSIS — Z9049 Acquired absence of other specified parts of digestive tract: Secondary | ICD-10-CM

## 2015-09-12 DIAGNOSIS — K5790 Diverticulosis of intestine, part unspecified, without perforation or abscess without bleeding: Secondary | ICD-10-CM | POA: Diagnosis present

## 2015-09-12 DIAGNOSIS — Z933 Colostomy status: Secondary | ICD-10-CM

## 2015-09-12 DIAGNOSIS — Z87891 Personal history of nicotine dependence: Secondary | ICD-10-CM

## 2015-09-12 DIAGNOSIS — Z8546 Personal history of malignant neoplasm of prostate: Secondary | ICD-10-CM | POA: Diagnosis not present

## 2015-09-12 DIAGNOSIS — K565 Intestinal adhesions [bands] with obstruction (postprocedural) (postinfection): Secondary | ICD-10-CM | POA: Diagnosis not present

## 2015-09-12 DIAGNOSIS — Z85828 Personal history of other malignant neoplasm of skin: Secondary | ICD-10-CM

## 2015-09-12 DIAGNOSIS — R109 Unspecified abdominal pain: Secondary | ICD-10-CM | POA: Diagnosis not present

## 2015-09-12 DIAGNOSIS — K56609 Unspecified intestinal obstruction, unspecified as to partial versus complete obstruction: Secondary | ICD-10-CM | POA: Diagnosis present

## 2015-09-12 DIAGNOSIS — I10 Essential (primary) hypertension: Secondary | ICD-10-CM | POA: Diagnosis present

## 2015-09-12 HISTORY — DX: Basal cell carcinoma of skin, unspecified: C44.91

## 2015-09-12 HISTORY — DX: Reserved for concepts with insufficient information to code with codable children: IMO0002

## 2015-09-12 HISTORY — DX: Malignant neoplasm of prostate: C61

## 2015-09-12 LAB — COMPREHENSIVE METABOLIC PANEL
ALT: 23 U/L (ref 17–63)
ANION GAP: 12 (ref 5–15)
AST: 24 U/L (ref 15–41)
Albumin: 3.6 g/dL (ref 3.5–5.0)
Alkaline Phosphatase: 67 U/L (ref 38–126)
BILIRUBIN TOTAL: 0.6 mg/dL (ref 0.3–1.2)
BUN: 18 mg/dL (ref 6–20)
CO2: 28 mmol/L (ref 22–32)
Calcium: 9.8 mg/dL (ref 8.9–10.3)
Chloride: 101 mmol/L (ref 101–111)
Creatinine, Ser: 1.15 mg/dL (ref 0.61–1.24)
GFR calc Af Amer: >60 mL/min (ref >60)
GFR calc non Af Amer: >60 mL/min (ref >60)
GLUCOSE: 138 mg/dL — AB (ref 65–99)
POTASSIUM: 4.3 mmol/L (ref 3.5–5.1)
Sodium: 141 mmol/L (ref 135–145)
TOTAL PROTEIN: 7.1 g/dL (ref 6.5–8.1)

## 2015-09-12 LAB — CBC
HEMATOCRIT: 41.5 % (ref 39.0–52.0)
HEMOGLOBIN: 14 g/dL (ref 13.0–17.0)
MCH: 30.8 pg (ref 26.0–34.0)
MCHC: 33.7 g/dL (ref 30.0–36.0)
MCV: 91.4 fL (ref 78.0–100.0)
Platelets: 227 10*3/uL (ref 150–400)
RBC: 4.54 MIL/uL (ref 4.22–5.81)
RDW: 13.1 % (ref 11.5–15.5)
WBC: 10.2 10*3/uL (ref 4.0–10.5)

## 2015-09-12 LAB — URINALYSIS, ROUTINE W REFLEX MICROSCOPIC
Bilirubin Urine: NEGATIVE
Glucose, UA: NEGATIVE mg/dL
HGB URINE DIPSTICK: NEGATIVE
Ketones, ur: NEGATIVE mg/dL
Leukocytes, UA: NEGATIVE
Nitrite: NEGATIVE
Protein, ur: NEGATIVE mg/dL
SPECIFIC GRAVITY, URINE: 1.021 (ref 1.005–1.030)
UROBILINOGEN UA: 1 mg/dL (ref 0.0–1.0)
pH: 5.5 (ref 5.0–8.0)

## 2015-09-12 LAB — LIPASE, BLOOD: LIPASE: 35 U/L (ref 22–51)

## 2015-09-12 MED ORDER — PROMETHAZINE HCL 25 MG/ML IJ SOLN: 6.2500 mg | INTRAMUSCULAR | Status: DC | PRN

## 2015-09-12 MED ORDER — METOPROLOL TARTRATE 1 MG/ML IV SOLN: 5.0000 mg | Freq: Four times a day (QID) | INTRAVENOUS | Status: DC | PRN

## 2015-09-12 MED ORDER — ONDANSETRON HCL 4 MG/2ML IJ SOLN
4.0000 mg | Freq: Once | INTRAMUSCULAR | Status: AC
  Administered 2015-09-12: 4 mg via INTRAVENOUS
  Filled 2015-09-12: qty 2

## 2015-09-12 MED ORDER — ENOXAPARIN SODIUM 40 MG/0.4ML ~~LOC~~ SOLN
40.0000 mg | SUBCUTANEOUS | Status: DC
  Administered 2015-09-12: 40 mg via SUBCUTANEOUS
  Filled 2015-09-12: qty 0.4

## 2015-09-12 MED ORDER — MORPHINE SULFATE (PF) 2 MG/ML IV SOLN
2.0000 mg | INTRAVENOUS | Status: DC | PRN
Start: 2015-09-12 — End: 2015-09-12

## 2015-09-12 MED ORDER — PHENOL 1.4 % MT LIQD
2.0000 | OROMUCOSAL | Status: DC | PRN
  Filled 2015-09-12: qty 177

## 2015-09-12 MED ORDER — PANTOPRAZOLE SODIUM 40 MG IV SOLR
40.0000 mg | Freq: Every day | INTRAVENOUS | Status: DC
  Administered 2015-09-12: 40 mg via INTRAVENOUS
  Filled 2015-09-12: qty 40

## 2015-09-12 MED ORDER — MORPHINE SULFATE (PF) 4 MG/ML IV SOLN
6.0000 mg | Freq: Once | INTRAVENOUS | Status: AC
  Administered 2015-09-12: 6 mg via INTRAVENOUS
  Filled 2015-09-12: qty 2

## 2015-09-12 MED ORDER — ACETAMINOPHEN 650 MG RE SUPP: 650.0000 mg | Freq: Four times a day (QID) | RECTAL | Status: DC | PRN

## 2015-09-12 MED ORDER — MAGIC MOUTHWASH
15.0000 mL | Freq: Four times a day (QID) | ORAL | Status: DC | PRN
  Filled 2015-09-12: qty 15

## 2015-09-12 MED ORDER — ONDANSETRON HCL 4 MG/2ML IJ SOLN: 4.0000 mg | Freq: Four times a day (QID) | INTRAMUSCULAR | Status: DC | PRN

## 2015-09-12 MED ORDER — IOHEXOL 300 MG/ML  SOLN
80.0000 mL | Freq: Once | INTRAMUSCULAR | Status: AC | PRN
  Administered 2015-09-12: 100 mL via INTRAVENOUS

## 2015-09-12 MED ORDER — MORPHINE SULFATE (PF) 2 MG/ML IV SOLN: 2.0000 mg | INTRAVENOUS | Status: DC | PRN

## 2015-09-12 MED ORDER — SODIUM CHLORIDE 0.9 % IV BOLUS (SEPSIS)
1000.0000 mL | Freq: Once | INTRAVENOUS | Status: AC
  Administered 2015-09-12: 1000 mL via INTRAVENOUS

## 2015-09-12 MED ORDER — BLISTEX MEDICATED EX OINT
1.0000 "application " | TOPICAL_OINTMENT | Freq: Two times a day (BID) | CUTANEOUS | Status: DC
  Administered 2015-09-12: 1 via TOPICAL
  Filled 2015-09-12: qty 10

## 2015-09-12 MED ORDER — DIPHENHYDRAMINE HCL 50 MG/ML IJ SOLN: 12.5000 mg | Freq: Four times a day (QID) | INTRAMUSCULAR | Status: DC | PRN

## 2015-09-12 MED ORDER — LACTATED RINGERS IV BOLUS (SEPSIS): 1000.0000 mL | Freq: Once | INTRAVENOUS | Status: DC

## 2015-09-12 MED ORDER — LACTATED RINGERS IV BOLUS (SEPSIS): 1000.0000 mL | Freq: Three times a day (TID) | INTRAVENOUS | Status: DC | PRN

## 2015-09-12 MED ORDER — ACETAMINOPHEN 325 MG PO TABS: 650.0000 mg | ORAL_TABLET | Freq: Four times a day (QID) | ORAL | Status: DC | PRN

## 2015-09-12 MED ORDER — ONDANSETRON 4 MG PO TBDP: 4.0000 mg | ORAL_TABLET | Freq: Four times a day (QID) | ORAL | Status: DC | PRN

## 2015-09-12 MED ORDER — MENTHOL 3 MG MT LOZG
1.0000 | LOZENGE | OROMUCOSAL | Status: DC | PRN
  Filled 2015-09-12: qty 9

## 2015-09-12 MED ORDER — SODIUM CHLORIDE 0.9 % IV SOLN
INTRAVENOUS | Status: DC
  Administered 2015-09-12 (×2): via INTRAVENOUS

## 2015-09-12 MED ORDER — ALUM & MAG HYDROXIDE-SIMETH 200-200-20 MG/5ML PO SUSP: 30.0000 mL | Freq: Four times a day (QID) | ORAL | Status: DC | PRN

## 2015-09-12 MED ORDER — BISACODYL 10 MG RE SUPP
10.0000 mg | Freq: Every day | RECTAL | Status: DC
  Filled 2015-09-12: qty 1

## 2015-09-12 NOTE — ED Notes (Signed)
Patient transported to CT 

## 2015-09-12 NOTE — ED Notes (Signed)
Pt given urinal.

## 2015-09-12 NOTE — ED Notes (Signed)
C/o intermittent lower abd pain with nausea since 6pm.  History of bowel obstructions.  States he vomited clear liquid x 1 tonight.  Normal BM a few hours ago.  Denies urinary complaint.

## 2015-09-12 NOTE — ED Provider Notes (Signed)
CSN: [id number]     Arrival date & time 09/12/15  0202 History  By signing my name below, I, Matthew Hodges, attest that this documentation has been prepared under the direction and in the presence of Matthew Balls, MD . Electronically Signed: Evelene Hodges, Scribe. 09/12/2015. 2:54 AM.  Chief Complaint  Patient presents with  . Abdominal Pain   The history is provided by the patient. No language interpreter was used.   HPI Comments:  Matthew Hodges is a 74 y.o. male with a history of ruptured diverticulitis and multiple abdominal surgeries,  who presents to the Emergency Department complaining of cramping intermittent lower abdominal pain since ~ 2000 last night. He reports a history of similar pain ~ 4 years ago when he had a SBO.  Pt reports associated nausea and vomiting (1 episode). His last BM was ~ 2200 last night and was fairly normal. He denies fever, and  urinary symptoms. No alleviating factors noted. Pt also noted recent hernia repair in  May 2016.  Last surgeon- Gross - hernia repair   Past Medical History  Diagnosis Date  . Cerebral artery disease   . Diverticular disease   . Hyperlipidemia   . Heart murmur   . Hypertension     hx. of . no meds since 2004  . Stroke ([place])     in 1979, no deficits  . Cancer [facility])     history prostate cancer treated with radiation and skin cancer   Past Surgical History  Procedure Laterality Date  . Partial colectomy  01/1999    Hartmans procedure with colostomy  . Small bowel repair  03/1999    postop abscesses with fistula.  Abscesses drained.  Fistula repaired.  . Colostomy takedown  08/1999  . Incisional hernia repair  2001    open w mesh  . Laparoscopic incisional / umbilical / ventral hernia repair  2008    with mesh.  LLQ old colostomy site  . Remove suture under anesthesia  2011    stitch abscesses LLQ & midline  . Ventral hernia repair N/A 04/23/2015    Procedure: LAPAROSCOPIC VENTRAL WALL HERNIA REPAIR AND LAP LYSIS OF  ADHESIONS;  Surgeon: Michael Boston, MD;  Location: [facility];  Service: General;  Laterality: N/A;  With MESH  . Lysis of adhesion N/A 04/23/2015    Procedure: LYSIS OF ADHESION;  Surgeon: Michael Boston, MD;  Location: [facility];  Service: General;  Laterality: N/A;   No family history on file. Social History  Substance Use Topics  . Smoking status: Former Smoker    Types: Cigarettes    Quit date: 11/30/2004  . Smokeless tobacco: Never Used  . Alcohol Use: No     Comment: recovering alcoholic sober since 0630    Review of Systems  A complete 10 system review of systems was obtained and all systems are negative except as noted in the HPI and PMH.    Allergies  Latex  Home Medications   Prior to Admission medications   Medication Sig Start Date End Date Taking? Authorizing Provider  aspirin EC 81 MG tablet Take 81 mg by mouth every morning.     Historical Provider, MD  atorvastatin (LIPITOR) 40 MG tablet Take 20 mg by mouth at bedtime.    Historical Provider, MD  Cholecalciferol (VITAMIN D3) 400 UNITS tablet Take 400 Units by mouth every morning.     Historical Provider, MD  ibuprofen (ADVIL,MOTRIN) 200 MG tablet Take 400 mg by mouth every 6 (  six) hours as needed for headache or moderate pain.    Historical Provider, MD  mirabegron ER (MYRBETRIQ) 25 MG TB24 tablet Take 25 mg by mouth every morning.    Historical Provider, MD  Multiple Vitamins-Minerals (MEGA MULTIVITAMIN FOR MEN PO) Take 1 tablet by mouth every morning.     Historical Provider, MD  omega-3 acid ethyl esters (LOVAZA) 1 G capsule Take 1 g by mouth every morning.     Historical Provider, MD  oxyCODONE (OXY IR/ROXICODONE) 5 MG immediate release tablet Take 1-2 tablets (5-10 mg total) by mouth every 6 (six) hours as needed for moderate pain, severe pain or breakthrough pain. 04/23/15   Michael Boston, MD  sildenafil (VIAGRA) 100 MG tablet Take 100 mg by mouth daily as needed for erectile dysfunction.     Historical Provider, MD    BP 148/62 mmHg  Pulse 66  Temp(Src) 97.6 F (36.4 C) (Oral)  Resp 20  SpO2 95% Physical Exam  Constitutional: He is oriented to person, place, and time. Vital signs are normal. He appears well-developed and well-nourished.  Non-toxic appearance. He does not appear ill. No distress.  HENT:  Head: Normocephalic and atraumatic.  Nose: Nose normal.  Mouth/Throat: Oropharynx is clear and moist. No oropharyngeal exudate.  Eyes: Conjunctivae and EOM are normal. Pupils are equal, round, and reactive to light. No scleral icterus.  Neck: Normal range of motion. Neck supple. No tracheal deviation, no edema, no erythema and normal range of motion present. No thyroid mass and no thyromegaly present.  Cardiovascular: Normal rate, regular rhythm, S1 normal, S2 normal, normal heart sounds, intact distal pulses and normal pulses.  Exam reveals no gallop and no friction rub.   No murmur heard. Pulses:      Radial pulses are 2+ on the right side, and 2+ on the left side.       Dorsalis pedis pulses are 2+ on the right side, and 2+ on the left side.  Pulmonary/Chest: Effort normal and breath sounds normal. No respiratory distress. He has no wheezes. He has no rhonchi. He has no rales.  Abdominal: Soft. Normal appearance and bowel sounds are normal. He exhibits distension. He exhibits no ascites. There is no hepatosplenomegaly. There is tenderness. There is no rebound, no guarding and no CVA tenderness.  Mid epigastric hernia  TTP mid epigastrum  Musculoskeletal: Normal range of motion. He exhibits no edema or tenderness.  Lymphadenopathy:    He has no cervical adenopathy.  Neurological: He is alert and oriented to person, place, and time. He has normal strength. No cranial nerve deficit or sensory deficit.  Skin: Skin is warm, dry and intact. No petechiae and no rash noted. He is not diaphoretic. No erythema. No pallor.  Psychiatric: He has a normal mood and affect. His behavior is normal. Judgment  normal.  Nursing note and vitals reviewed.   ED Course  Procedures   DIAGNOSTIC STUDIES:  Oxygen Saturation is 95% on RA, adequate by my interpretation.    COORDINATION OF CARE:  2:40 AM Discussed treatment plan with pt at bedside and pt agreed to plan.  Labs Review Labs Reviewed  COMPREHENSIVE METABOLIC PANEL - Abnormal; Notable for the following:    Glucose, Bld 138 (*)    All other components within normal limits  LIPASE, BLOOD  CBC  URINALYSIS, ROUTINE W REFLEX MICROSCOPIC (NOT AT [facility])    Imaging Review Ct Abdomen Pelvis W Contrast  09/12/2015  CLINICAL DATA:  Intermittent lower abdominal pain beginning at 6  p.m. History of bowel obstruction. History diverticular disease, colostomy takedown, stroke, cancer. EXAM: CT ABDOMEN AND PELVIS WITH CONTRAST TECHNIQUE: Multidetector CT imaging of the abdomen and pelvis was performed using the standard protocol following bolus administration of intravenous contrast. CONTRAST:  127mL OMNIPAQUE IOHEXOL 300 MG/ML  SOLN COMPARISON:  CT abdomen and pelvis October 16, 2011 FINDINGS: LUNG BASES: Included view of the lung bases demonstrate dependent atelectasis. Visualized heart size is normal. Moderate coronary artery calcifications. No pericardial effusion. Mildly patulous included esophagus. SOLID ORGANS: The liver is diffusely mildly dense compatible with steatosis. A few punctate hepatic granulomas. Sub cm probable cyst in RIGHT lobe of the liver. Spleen, gallbladder, pancreas and adrenal glands are unremarkable. GASTROINTESTINAL TRACT: Dilated small bowel up to 3.8 cm adherent to the anterior abdominal wall, with small bowel feces is similar to prior CT, transition point in RIGHT upper quadrant. The stomach, large bowel are normal in course and caliber without inflammatory changes. Moderate colonic diverticulosis without acute component. Normal appendix. KIDNEYS/ URINARY TRACT: Horseshoe kidney, normal enhancement. Stable subcentimeter too small  to characterize hypodensity in LEFT moiety, 9 mm stable hypodensity RIGHT moiety. No nephrolithiasis, hydronephrosis or solid renal masses. The unopacified ureters are normal in course and caliber. Delayed imaging through the kidneys demonstrates symmetric prompt contrast excretion within the proximal urinary collecting system. Urinary bladder is partially distended and unremarkable. PERITONEUM/RETROPERITONEUM: Severe calcific atherosclerosis of the aortoiliac vessels, high-grade stenosis infrarenal aorta is similar, with calcifications nearly obscuring the central lumen. No lymphadenopathy by CT size criteria. Prostate size is normal. No intraperitoneal free fluid nor free air. SOFT TISSUE/OSSEOUS STRUCTURES: Non-suspicious. Moderate RIGHT, small LEFT fat containing inguinal hernias. Coil material along LEFT anterior abdominal wall compatible with herniorrhaphy. LEFT rectus abdominus and external oblique muscle atrophy. IMPRESSION: Recurrent moderate grade small bowel obstruction, likely due to adhesions along the anterior abdominal wall. Severe calcific atherosclerosis results in apparent high-grade stenosis infrarenal aorta, unchanged. Mildly patulous included esophagus, without CT findings of achalasia though nonemergent esophagram would be more sensitive. Colonic diverticulosis without acute diverticulitis. Electronically Signed   By: Elon Alas M.D.   On: 09/12/2015 04:24   I have personally reviewed and evaluated these images and lab results as part of my medical decision-making.   EKG Interpretation None      MDM   Final diagnoses:  None    Patient presents to the emergency department for abdominal pain which he states feels just like his prior small bowel obstruction. Physical exam does show a mid epigastric hernia. Will obtain CT scan for evaluation. Patient states he cannot tolerate oral contrast. He was given IV fluids, morphine, Zofran for his symptoms.  Upon repeat evaluation,  patient's symptoms have completely resolved. He is requesting to go home. CT scan however unfortunately shows recurrent to moderate grade small bowel obstruction, likely due to his adhesions. Patient was informed of diagnosis, I have paged Dr. Barry Dienes with general surgery for admission.  I have been unable to reach Dr. Barry Dienes for admission throughout the night, Dr. Hassell Done was paged who is the backup surgeon, he states that he will take care of the admission and a mid level will be down to admit the patient. He currently is stable and in no acute distress.   I, Matthew Hodges, personally performed the services described in this documentation. All medical record entries made by the scribe were at my direction and in my presence.  I have reviewed the chart and discharge instructions and agree that the record reflects my personal  performance and is accurate and complete. Matthew Hodges.  09/12/2015. 4:07 AM.     Matthew Balls, MD 09/12/15 [phone]

## 2015-09-12 NOTE — ED Notes (Signed)
Removed nasal cannula to check pt's O2 level.  Pt decreased to 84% on RA.  Placed pt back on 2L nasal cannula.  Pt reports he is not on O2 at home.

## 2015-09-12 NOTE — ED Notes (Signed)
Attempted report 

## 2015-09-12 NOTE — H&P (Signed)
Matthew Hodges is an 74 y.o. male.   Chief Complaint: abd pain HPI: 62 yom with history of sbo in 2012 managed noneoperatively.  He has history of diverticulitis with colectomy/colostomy followed by takedown.  He had lvh with mesh in may of this year.  He has prior history of multiple hernia repairs. He presents with lower crampy abd pain since last night. Some nausea, no emesis, last flatus and bm was at 10 pm. No fevers.  No issues urinating.    Past Medical History  Diagnosis Date  . Cerebral artery disease   . Diverticular disease   . Hyperlipidemia   . Heart murmur   . Hypertension     hx. of . no meds since 2004  . Stroke ([place])     in 1979, no deficits  . Cancer [facility])     history prostate cancer treated with radiation and skin cancer    Past Surgical History  Procedure Laterality Date  . Partial colectomy  01/1999    Hartmans procedure with colostomy  . Small bowel repair  03/1999    postop abscesses with fistula.  Abscesses drained.  Fistula repaired.  . Colostomy takedown  08/1999  . Incisional hernia repair  2001    open w mesh  . Laparoscopic incisional / umbilical / ventral hernia repair  2008    with mesh.  LLQ old colostomy site  . Remove suture under anesthesia  2011    stitch abscesses LLQ & midline  . Ventral hernia repair N/A 04/23/2015    Procedure: LAPAROSCOPIC VENTRAL WALL HERNIA REPAIR AND LAP LYSIS OF ADHESIONS;  Surgeon: Michael Boston, MD;  Location: [facility];  Service: General;  Laterality: N/A;  With MESH  . Lysis of adhesion N/A 04/23/2015    Procedure: LYSIS OF ADHESION;  Surgeon: Michael Boston, MD;  Location: [facility];  Service: General;  Laterality: N/A;    No family history on file. Social History:  reports that he quit smoking about 10 years ago. His smoking use included Cigarettes. He has never used smokeless tobacco. He reports that he does not drink alcohol or use illicit drugs.  Allergies:  Allergies  Allergen Reactions  . Latex Rash   meds  reviewed  Results for orders placed or performed during the hospital encounter of 09/12/15 (from the past 48 hour(s))  Lipase, blood     Status: None   Collection Time: 09/12/15  2:23 AM  Result Value Ref Range   Lipase 35 22 - 51 U/L  Comprehensive metabolic panel     Status: Abnormal   Collection Time: 09/12/15  2:23 AM  Result Value Ref Range   Sodium 141 135 - 145 mmol/L   Potassium 4.3 3.5 - 5.1 mmol/L   Chloride 101 101 - 111 mmol/L   CO2 28 22 - 32 mmol/L   Glucose, Bld 138 (H) 65 - 99 mg/dL   BUN 18 6 - 20 mg/dL   Creatinine, Ser 1.15 0.61 - 1.24 mg/dL   Calcium 9.8 8.9 - 10.3 mg/dL   Total Protein 7.1 6.5 - 8.1 g/dL   Albumin 3.6 3.5 - 5.0 g/dL   AST 24 15 - 41 U/L   ALT 23 17 - 63 U/L   Alkaline Phosphatase 67 38 - 126 U/L   Total Bilirubin 0.6 0.3 - 1.2 mg/dL   GFR calc non Af Amer >60 >60 mL/min   GFR calc Af Amer >60 >60 mL/min    Comment: (NOTE) The eGFR has been  calculated using the CKD EPI equation. This calculation has not been validated in all clinical situations. eGFR's persistently <60 mL/min signify possible Chronic Kidney Disease.    Anion gap 12 5 - 15  CBC     Status: None   Collection Time: 09/12/15  2:23 AM  Result Value Ref Range   WBC 10.2 4.0 - 10.5 K/uL   RBC 4.54 4.22 - 5.81 MIL/uL   Hemoglobin 14.0 13.0 - 17.0 g/dL   HCT 41.5 39.0 - 52.0 %   MCV 91.4 78.0 - 100.0 fL   MCH 30.8 26.0 - 34.0 pg   MCHC 33.7 30.0 - 36.0 g/dL   RDW 13.1 11.5 - 15.5 %   Platelets 227 150 - 400 K/uL  Urinalysis, Routine w reflex microscopic (not at [facility])     Status: None   Collection Time: 09/12/15  2:30 AM  Result Value Ref Range   Color, Urine YELLOW YELLOW   APPearance CLEAR CLEAR   Specific Gravity, Urine 1.021 1.005 - 1.030   pH 5.5 5.0 - 8.0   Glucose, UA NEGATIVE NEGATIVE mg/dL   Hgb urine dipstick NEGATIVE NEGATIVE   Bilirubin Urine NEGATIVE NEGATIVE   Ketones, ur NEGATIVE NEGATIVE mg/dL   Protein, ur NEGATIVE NEGATIVE mg/dL   Urobilinogen, UA  1.0 0.0 - 1.0 mg/dL   Nitrite NEGATIVE NEGATIVE   Leukocytes, UA NEGATIVE NEGATIVE    Comment: MICROSCOPIC NOT DONE ON URINES WITH NEGATIVE PROTEIN, BLOOD, LEUKOCYTES, NITRITE, OR GLUCOSE <1000 mg/dL.   Ct Abdomen Pelvis W Contrast  09/12/2015  CLINICAL DATA:  Intermittent lower abdominal pain beginning at 6 p.m. History of bowel obstruction. History diverticular disease, colostomy takedown, stroke, cancer. EXAM: CT ABDOMEN AND PELVIS WITH CONTRAST TECHNIQUE: Multidetector CT imaging of the abdomen and pelvis was performed using the standard protocol following bolus administration of intravenous contrast. CONTRAST:  126mL OMNIPAQUE IOHEXOL 300 MG/ML  SOLN COMPARISON:  CT abdomen and pelvis October 16, 2011 FINDINGS: LUNG BASES: Included view of the lung bases demonstrate dependent atelectasis. Visualized heart size is normal. Moderate coronary artery calcifications. No pericardial effusion. Mildly patulous included esophagus. SOLID ORGANS: The liver is diffusely mildly dense compatible with steatosis. A few punctate hepatic granulomas. Sub cm probable cyst in RIGHT lobe of the liver. Spleen, gallbladder, pancreas and adrenal glands are unremarkable. GASTROINTESTINAL TRACT: Dilated small bowel up to 3.8 cm adherent to the anterior abdominal wall, with small bowel feces is similar to prior CT, transition point in RIGHT upper quadrant. The stomach, large bowel are normal in course and caliber without inflammatory changes. Moderate colonic diverticulosis without acute component. Normal appendix. KIDNEYS/ URINARY TRACT: Horseshoe kidney, normal enhancement. Stable subcentimeter too small to characterize hypodensity in LEFT moiety, 9 mm stable hypodensity RIGHT moiety. No nephrolithiasis, hydronephrosis or solid renal masses. The unopacified ureters are normal in course and caliber. Delayed imaging through the kidneys demonstrates symmetric prompt contrast excretion within the proximal urinary collecting system.  Urinary bladder is partially distended and unremarkable. PERITONEUM/RETROPERITONEUM: Severe calcific atherosclerosis of the aortoiliac vessels, high-grade stenosis infrarenal aorta is similar, with calcifications nearly obscuring the central lumen. No lymphadenopathy by CT size criteria. Prostate size is normal. No intraperitoneal free fluid nor free air. SOFT TISSUE/OSSEOUS STRUCTURES: Non-suspicious. Moderate RIGHT, small LEFT fat containing inguinal hernias. Coil material along LEFT anterior abdominal wall compatible with herniorrhaphy. LEFT rectus abdominus and external oblique muscle atrophy. IMPRESSION: Recurrent moderate grade small bowel obstruction, likely due to adhesions along the anterior abdominal wall. Severe calcific atherosclerosis results in apparent  high-grade stenosis infrarenal aorta, unchanged. Mildly patulous included esophagus, without CT findings of achalasia though nonemergent esophagram would be more sensitive. Colonic diverticulosis without acute diverticulitis. Electronically Signed   By: Elon Alas M.D.   On: 09/12/2015 04:24    Review of Systems  Constitutional: Negative for fever and chills.  Respiratory: Negative for shortness of breath.   Cardiovascular: Negative for chest pain.  Gastrointestinal: Positive for nausea, abdominal pain and constipation. Negative for vomiting and diarrhea.    Blood pressure 146/64, pulse 67, temperature 97.6 F (36.4 C), temperature source Oral, resp. rate 20, SpO2 99 %. Physical Exam  Constitutional: He is oriented to person, place, and time. He appears well-developed and well-nourished.  Eyes: No scleral icterus.  Cardiovascular: Normal rate, regular rhythm and normal heart sounds.   Respiratory: Effort normal and breath sounds normal. He has no wheezes. He has no rales.  GI: Soft. He exhibits distension (mild). Bowel sounds are decreased. There is no tenderness. No hernia.    Lymphadenopathy:    He has no cervical  adenopathy.  Neurological: He is alert and oriented to person, place, and time.  Skin: He is not diaphoretic.     Assessment/Plan sbo  This appears to be relatively mild. He does not want ng now. Will reassess again later today.  Discussed npo and nonoperative mgt with success rate.  If does not improve quickly will place ng.  Will check films in am tomorrow.   Dacoda Finlay 09/12/2015, 7:26 AM

## 2015-09-13 ENCOUNTER — Inpatient Hospital Stay (HOSPITAL_COMMUNITY): Payer: Medicare Other

## 2015-09-13 LAB — CBC
HEMATOCRIT: 36.2 % — AB (ref 39.0–52.0)
Hemoglobin: 11.8 g/dL — ABNORMAL LOW (ref 13.0–17.0)
MCH: 30.8 pg (ref 26.0–34.0)
MCHC: 32.6 g/dL (ref 30.0–36.0)
MCV: 94.5 fL (ref 78.0–100.0)
Platelets: 170 10*3/uL (ref 150–400)
RBC: 3.83 MIL/uL — ABNORMAL LOW (ref 4.22–5.81)
RDW: 13.4 % (ref 11.5–15.5)
WBC: 4.3 10*3/uL (ref 4.0–10.5)

## 2015-09-13 LAB — BASIC METABOLIC PANEL
ANION GAP: 7 (ref 5–15)
BUN: 17 mg/dL (ref 6–20)
CALCIUM: 8 mg/dL — AB (ref 8.9–10.3)
CO2: 25 mmol/L (ref 22–32)
Chloride: 106 mmol/L (ref 101–111)
Creatinine, Ser: 1.08 mg/dL (ref 0.61–1.24)
GFR calc Af Amer: >60 mL/min (ref >60)
GFR calc non Af Amer: >60 mL/min (ref >60)
GLUCOSE: 89 mg/dL (ref 65–99)
Potassium: 4 mmol/L (ref 3.5–5.1)
Sodium: 138 mmol/L (ref 135–145)

## 2015-09-13 NOTE — Discharge Instructions (Signed)
Small Bowel Obstruction °A small bowel obstruction is a blockage in the small bowel. The small bowel, which is also called the small intestine, is a long, slender tube that connects the stomach to the colon. When a person eats and drinks, food and fluids go from the stomach to the small bowel. This is where most of the nutrients in the food and fluids are absorbed. °A small bowel obstruction will prevent food and fluids from passing through the small bowel as they normally do during digestion. The small bowel can become partially or completely blocked. This can cause symptoms such as abdominal pain, vomiting, and bloating. If this condition is not treated, it can be dangerous because the small bowel could rupture. °CAUSES °Common causes of this condition include: °· Scar tissue from previous surgery or radiation treatment. °· Recent surgery. This may cause the movements of the bowel to slow down and cause food to block the intestine. °· Hernias. °· Inflammatory bowel disease (colitis). °· Twisting of the bowel (volvulus). °· Tumors. °· A foreign body. °· Slipping of a part of the bowel into another part (intussusception). °SYMPTOMS °Symptoms of this condition include: °· Abdominal pain. This may be dull cramps or sharp pain. It may occur in one area, or it may be present in the entire abdomen. Pain can range from mild to severe, depending on the degree of obstruction. °· Nausea and vomiting. Vomit may be greenish or a yellow bile color. °· Abdominal bloating. °· Constipation. °· Lack of passing gas. °· Frequent belching. °· Diarrhea. This may occur if the obstruction is partial and runny stool is able to leak around the obstruction. °DIAGNOSIS °This condition may be diagnosed based on a physical exam, medical history, and X-rays of the abdomen. You may also have other tests, such as a CT scan of the abdomen and pelvis. °TREATMENT °Treatment for this condition depends on the cause and severity of the problem.  Treatment options may include: °· Bed rest along with fluids and pain medicines that are given through an IV tube inserted into one of your veins. Sometimes, this is all that is needed for the obstruction to improve. °· Following a simple diet. In some cases, a clear liquid diet may be required for several days. This allows the bowel to rest. °· Placement of a small tube (nasogastric tube) into the stomach. When the bowel is blocked, it usually swells up like a balloon that is filled with air and fluids. The air and fluids may be removed by suction through the nasogastric tube. This can help with pain, discomfort, and nausea. It can also help the obstruction to clear up faster. °· Surgery. This may be required if other treatments do not work. Bowel obstruction from a hernia may require early surgery and can be an emergency procedure. Surgery may also be required for scar tissue that causes frequent or severe obstructions. °HOME CARE INSTRUCTIONS °· Get plenty of rest. °· Follow instructions from your health care provider about eating restrictions. You may need to avoid solid foods and consume only clear liquids until your condition improves. °· Take over-the-counter and prescription medicines only as told by your health care provider. °· Keep all follow-up visits as told by your health care provider. This is important. °SEEK MEDICAL CARE IF: °· You have a fever. °· You have chills. °SEEK IMMEDIATE MEDICAL CARE IF: °· You have increased pain or cramping. °· You vomit blood. °· You have uncontrolled vomiting or nausea. °· You cannot drink   fluids because of vomiting or pain.  You develop confusion.  You begin feeling very dry or thirsty (dehydrated).  You have severe bloating.  You feel extremely weak or you faint.   This information is not intended to replace advice given to you by your health care provider. Make sure you discuss any questions you have with your health care provider.   Document Released:  02/02/2006 Document Revised: 08/07/2015 Document Reviewed: 01/10/2015 Elsevier Interactive Patient Education 2016 [employer].  Low-Fiber Diet for a few days Fiber is found in fruits, vegetables, and whole grains. A low-fiber diet restricts fibrous foods that are not digested in the small intestine. A diet containing about 10-15 grams of fiber per day is considered low fiber. Low-fiber diets may be used to:  Promote healing and rest the bowel during intestinal flare-ups.  Prevent blockage of a partially obstructed or narrowed gastrointestinal tract.  Reduce fecal weight and volume.  Slow the movement of feces. You may be on a low-fiber diet as a transitional diet following surgery, after an injury (trauma), or because of a short (acute) or lifelong (chronic) illness. Your health care provider will determine the length of time you need to stay on this diet.  WHAT DO I NEED TO KNOW ABOUT A LOW-FIBER DIET? Always check the fiber content on the packaging's Nutrition Facts label, especially on foods from the grains list. Ask your dietitian if you have questions about specific foods that are related to your condition, especially if the food is not listed below. In general, a low-fiber food will have less than 2 g of fiber. WHAT FOODS CAN I EAT? Grains All breads and crackers made with white flour. Sweet rolls, doughnuts, waffles, pancakes, [place] toast, bagels. Pretzels, Melba toast, zwieback. Well-cooked cereals, such as cornmeal, farina, or cream cereals. Dry cereals that do not contain whole grains, fruit, or nuts, such as refined corn, wheat, rice, and oat cereals. Potatoes prepared any way without skins, plain pastas and noodles, refined white rice. Use white flour for baking and making sauces. Use allowed list of grains for casseroles, dumplings, and puddings.  Vegetables Strained tomato and vegetable juices. Fresh lettuce, cucumber, spinach. Well-cooked (no skin or pulp) or canned vegetables,  such as asparagus, bean sprouts, beets, carrots, green beans, mushrooms, potatoes, pumpkin, spinach, yellow squash, tomato sauce/puree, turnips, yams, and zucchini. Keep servings limited to  cup.  Fruits All fruit juices except prune juice. Cooked or canned fruits without skin and seeds, such as applesauce, apricots, cherries, fruit cocktail, grapefruit, grapes, mandarin oranges, melons, peaches, pears, pineapple, and plums. Fresh fruits without skin, such as apricots, avocados, bananas, melons, pineapple, nectarines, and peaches. Keep servings limited to  cup or 1 piece.  Meat and Other Protein Sources Ground or well-cooked tender beef, ham, veal, lamb, pork, or poultry. Eggs, plain cheese. Fish, oysters, shrimp, lobster, and other seafood. Liver, organ meats. Smooth nut butters. Dairy All milk products and alternative dairy substitutes, such as soy, rice, almond, and coconut, not containing added whole nuts, seeds, or added fruit. Beverages Decaf coffee, fruit, and vegetable juices or smoothies (small amounts, with no pulp or skins, and with fruits from allowed list), sports drinks, herbal tea. Condiments Ketchup, mustard, vinegar, cream sauce, cheese sauce, cocoa powder. Spices in moderation, such as allspice, basil, bay leaves, celery powder or leaves, cinnamon, cumin powder, curry powder, ginger, mace, marjoram, onion or garlic powder, oregano, paprika, parsley flakes, ground pepper, rosemary, sage, savory, tarragon, thyme, and turmeric. Sweets and Desserts Plain cakes  and cookies, pie made with allowed fruit, pudding, custard, cream pie. Gelatin, fruit, ice, sherbet, frozen ice pops. Ice cream, ice milk without nuts. Plain hard candy, honey, jelly, molasses, syrup, sugar, chocolate syrup, gumdrops, marshmallows. Limit overall sugar intake.  Fats and Oil Margarine, butter, cream, mayonnaise, salad oils, plain salad dressings made from allowed foods. Choose healthy fats such as olive oil, canola  oil, and omega-3 fatty acids (such as found in salmon or tuna) when possible.  Other Bouillon, broth, or cream soups made from allowed foods. Any strained soup. Casseroles or mixed dishes made with allowed foods. The items listed above may not be a complete list of recommended foods or beverages. Contact your dietitian for more options.  WHAT FOODS ARE NOT RECOMMENDED? Grains All whole wheat and whole grain breads and crackers. Multigrains, rye, bran seeds, nuts, or coconut. Cereals containing whole grains, multigrains, bran, coconut, nuts, raisins. Cooked or dry oatmeal, steel-cut oats. Coarse wheat cereals, granola. Cereals advertised as high fiber. Potato skins. Whole grain pasta, wild or brown rice. Popcorn. Coconut flour. Bran, buckwheat, corn bread, multigrains, rye, wheat germ.  Vegetables Fresh, cooked or canned vegetables, such as artichokes, asparagus, beet greens, broccoli, Brussels sprouts, cabbage, celery, cauliflower, corn, eggplant, kale, legumes or beans, okra, peas, and tomatoes. Avoid large servings of any vegetables, especially raw vegetables.  Fruits Fresh fruits, such as apples with or without skin, berries, cherries, figs, grapes, grapefruit, guavas, kiwis, mangoes, oranges, papayas, pears, persimmons, pineapple, and pomegranate. Prune juice and juices with pulp, stewed or dried prunes. Dried fruits, dates, raisins. Fruit seeds or skins. Avoid large servings of all fresh fruits. Meats and Other Protein Sources Tough, fibrous meats with gristle. Chunky nut butter. Cheese made with seeds, nuts, or other foods not recommended. Nuts, seeds, legumes (beans, including baked beans), dried peas, beans, lentils.  Dairy Yogurt or cheese that contains nuts, seeds, or added fruit.  Beverages Fruit juices with high pulp, prune juice. Caffeinated coffee and teas.  Condiments Coconut, maple syrup, pickles, olives. Sweets and Desserts Desserts, cookies, or candies that contain nuts or  coconut, chunky peanut butter, dried fruits. Jams, preserves with seeds, marmalade. Large amounts of sugar and sweets. Any other dessert made with fruits from the not recommended list.  Other Soups made from vegetables that are not recommended or that contain other foods not recommended.  The items listed above may not be a complete list of foods and beverages to avoid. Contact your dietitian for more information.   This information is not intended to replace advice given to you by your health care provider. Make sure you discuss any questions you have with your health care provider.   Document Released: 05/08/2002 Document Revised: 11/21/2013 Document Reviewed: 10/09/2013 Elsevier Interactive Patient Education [employer].

## 2015-09-13 NOTE — Discharge Summary (Signed)
  Patient ID: BOHDAN MACHO MRN: [id number] DOB/AGE: 07-27-1941 74 y.o.  Admit date: 09/12/2015 Discharge date: 09/13/2015  Procedures: none  Consults: None  Reason for Admission: 4 yom with history of sbo in 2012 managed noneoperatively. He has history of diverticulitis with colectomy/colostomy followed by takedown. He had lvh with mesh in may of this year. He has prior history of multiple hernia repairs. He presents with lower crampy abd pain since last night. Some nausea, no emesis, last flatus and bm was at 10 pm. No fevers. No issues urinating.  Admission Diagnoses:  1. SBO  Hospital Course: The patient was admitted and refused an NGT.  On HD2, he was passing flatus and had had a BM.  He felt back to normal and wanted to go home.  He was given full liquids, which he tolerated well.  He was stable for dc home and educated on how to advance his diet at home.  Discharge Diagnoses:  Active Problems:   SBO (small bowel obstruction) (HCC)   Discharge Medications:   Medication List    TAKE these medications        aspirin EC 81 MG tablet  Take 81 mg by mouth every morning.     atorvastatin 40 MG tablet  Commonly known as:  LIPITOR  Take 20 mg by mouth at bedtime.     ibuprofen 200 MG tablet  Commonly known as:  ADVIL,MOTRIN  Take 400 mg by mouth every 6 (six) hours as needed for headache or moderate pain.     MEGA MULTIVITAMIN FOR MEN PO  Take 1 tablet by mouth every morning.     mirabegron ER 25 MG Tb24 tablet  Commonly known as:  MYRBETRIQ  Take 25 mg by mouth every morning.     omega-3 acid ethyl esters 1 G capsule  Commonly known as:  LOVAZA  Take 1 g by mouth every morning.     oxyCODONE 5 MG immediate release tablet  Commonly known as:  Oxy IR/ROXICODONE  Take 1-2 tablets (5-10 mg total) by mouth every 6 (six) hours as needed for moderate pain, severe pain or breakthrough pain.     sildenafil 100 MG tablet  Commonly known as:  VIAGRA  Take 100 mg  by mouth daily as needed for erectile dysfunction.     Vitamin D3 400 UNITS tablet  Take 400 Units by mouth every morning.        Discharge Instructions:     Follow-up Information    Follow up with Marjorie Smolder, MD.   Specialty:  Family Medicine   Why:  As needed   Contact information:   [address] [address] [phone]       Signed: Henreitta Cea 09/13/2015, 11:35 AM

## 2015-09-13 NOTE — Progress Notes (Signed)
Subjective: Passing flatus, feels back to normal, wants to go home  Objective: Vital signs in last 24 hours: Temp:  [97.3 F (36.3 C)-98.5 F (36.9 C)] 97.3 F (36.3 C) (10/14 0556) Pulse Rate:  [67-83] 67 (10/14 0556) Resp:  [16-18] 18 (10/14 0556) BP: (101-132)/(51-60) 120/60 mmHg (10/14 0556) SpO2:  [93 %-98 %] 97 % (10/14 0556) Weight:  [87.091 kg (192 lb)] 87.091 kg (192 lb) (10/13 1529) Last BM Date: 09/12/15  Intake/Output from previous day: 10/13 0701 - 10/14 0700 In: -  Out: 600 [Urine:600] Intake/Output this shift: Total I/O In: -  Out: 280 [Urine:280]  GI: soft nt/nd bs present  Lab Results:   Recent Labs  09/12/15 0223 09/13/15 0333  WBC 10.2 4.3  HGB 14.0 11.8*  HCT 41.5 36.2*  PLT 227 170   BMET  Recent Labs  09/12/15 0223 09/13/15 0333  NA 141 138  K 4.3 4.0  CL 101 106  CO2 28 25  GLUCOSE 138* 89  BUN 18 17  CREATININE 1.15 1.08  CALCIUM 9.8 8.0*   PT/INR No results for input(s): LABPROT, INR in the last 72 hours. ABG No results for input(s): PHART, HCO3 in the last 72 hours.  Invalid input(s): PCO2, PO2  Studies/Results: Ct Abdomen Pelvis W Contrast  09/12/2015  CLINICAL DATA:  Intermittent lower abdominal pain beginning at 6 p.m. History of bowel obstruction. History diverticular disease, colostomy takedown, stroke, cancer. EXAM: CT ABDOMEN AND PELVIS WITH CONTRAST TECHNIQUE: Multidetector CT imaging of the abdomen and pelvis was performed using the standard protocol following bolus administration of intravenous contrast. CONTRAST:  133mL OMNIPAQUE IOHEXOL 300 MG/ML  SOLN COMPARISON:  CT abdomen and pelvis October 16, 2011 FINDINGS: LUNG BASES: Included view of the lung bases demonstrate dependent atelectasis. Visualized heart size is normal. Moderate coronary artery calcifications. No pericardial effusion. Mildly patulous included esophagus. SOLID ORGANS: The liver is diffusely mildly dense compatible with steatosis. A few  punctate hepatic granulomas. Sub cm probable cyst in RIGHT lobe of the liver. Spleen, gallbladder, pancreas and adrenal glands are unremarkable. GASTROINTESTINAL TRACT: Dilated small bowel up to 3.8 cm adherent to the anterior abdominal wall, with small bowel feces is similar to prior CT, transition point in RIGHT upper quadrant. The stomach, large bowel are normal in course and caliber without inflammatory changes. Moderate colonic diverticulosis without acute component. Normal appendix. KIDNEYS/ URINARY TRACT: Horseshoe kidney, normal enhancement. Stable subcentimeter too small to characterize hypodensity in LEFT moiety, 9 mm stable hypodensity RIGHT moiety. No nephrolithiasis, hydronephrosis or solid renal masses. The unopacified ureters are normal in course and caliber. Delayed imaging through the kidneys demonstrates symmetric prompt contrast excretion within the proximal urinary collecting system. Urinary bladder is partially distended and unremarkable. PERITONEUM/RETROPERITONEUM: Severe calcific atherosclerosis of the aortoiliac vessels, high-grade stenosis infrarenal aorta is similar, with calcifications nearly obscuring the central lumen. No lymphadenopathy by CT size criteria. Prostate size is normal. No intraperitoneal free fluid nor free air. SOFT TISSUE/OSSEOUS STRUCTURES: Non-suspicious. Moderate RIGHT, small LEFT fat containing inguinal hernias. Coil material along LEFT anterior abdominal wall compatible with herniorrhaphy. LEFT rectus abdominus and external oblique muscle atrophy. IMPRESSION: Recurrent moderate grade small bowel obstruction, likely due to adhesions along the anterior abdominal wall. Severe calcific atherosclerosis results in apparent high-grade stenosis infrarenal aorta, unchanged. Mildly patulous included esophagus, without CT findings of achalasia though nonemergent esophagram would be more sensitive. Colonic diverticulosis without acute diverticulitis. Electronically Signed   By:  Elon Alas M.D.   On: 09/12/2015 04:24  Anti-infectives: Anti-infectives    None      Assessment/Plan: Resolved sbo   Fulls this am if tolerates will dc home today  [facility] 09/13/2015

## 2015-09-13 NOTE — Progress Notes (Signed)
Pt. Given discharge papers and education about a low fiber diet for the next few days. Pt and wife verbally understood. Pt going home with wife.

## 2015-09-15 ENCOUNTER — Inpatient Hospital Stay (HOSPITAL_COMMUNITY)
Admission: EM | Admit: 2015-09-15 | Discharge: 2015-09-20 | DRG: 390 | Disposition: A | Discharge status: Home / Self Care | Payer: Medicare Other | Attending: Surgery | Admitting: Surgery

## 2015-09-15 ENCOUNTER — Encounter (HOSPITAL_COMMUNITY): Payer: Self-pay | Admitting: Emergency Medicine

## 2015-09-15 DIAGNOSIS — I10 Essential (primary) hypertension: Secondary | ICD-10-CM | POA: Diagnosis present

## 2015-09-15 DIAGNOSIS — Z79899 Other long term (current) drug therapy: Secondary | ICD-10-CM

## 2015-09-15 DIAGNOSIS — Z0189 Encounter for other specified special examinations: Secondary | ICD-10-CM

## 2015-09-15 DIAGNOSIS — Z85828 Personal history of other malignant neoplasm of skin: Secondary | ICD-10-CM

## 2015-09-15 DIAGNOSIS — K5660 Unspecified intestinal obstruction: Secondary | ICD-10-CM | POA: Diagnosis not present

## 2015-09-15 DIAGNOSIS — K432 Incisional hernia without obstruction or gangrene: Secondary | ICD-10-CM | POA: Diagnosis present

## 2015-09-15 DIAGNOSIS — Z923 Personal history of irradiation: Secondary | ICD-10-CM

## 2015-09-15 DIAGNOSIS — Z8546 Personal history of malignant neoplasm of prostate: Secondary | ICD-10-CM

## 2015-09-15 DIAGNOSIS — K56609 Unspecified intestinal obstruction, unspecified as to partial versus complete obstruction: Secondary | ICD-10-CM

## 2015-09-15 DIAGNOSIS — R1110 Vomiting, unspecified: Secondary | ICD-10-CM | POA: Diagnosis not present

## 2015-09-15 DIAGNOSIS — E785 Hyperlipidemia, unspecified: Secondary | ICD-10-CM | POA: Diagnosis present

## 2015-09-15 DIAGNOSIS — Z87891 Personal history of nicotine dependence: Secondary | ICD-10-CM

## 2015-09-15 DIAGNOSIS — Z8673 Personal history of transient ischemic attack (TIA), and cerebral infarction without residual deficits: Secondary | ICD-10-CM

## 2015-09-15 DIAGNOSIS — I679 Cerebrovascular disease, unspecified: Secondary | ICD-10-CM | POA: Diagnosis present

## 2015-09-15 NOTE — ED Notes (Addendum)
C/o lower abd pain and nausea since 7pm tonight .  Pt was here on 10/13 for bowel obstruction and reports same symptoms tonight.

## 2015-09-16 ENCOUNTER — Emergency Department (HOSPITAL_COMMUNITY): Payer: Medicare Other

## 2015-09-16 DIAGNOSIS — I679 Cerebrovascular disease, unspecified: Secondary | ICD-10-CM | POA: Diagnosis present

## 2015-09-16 DIAGNOSIS — K5660 Unspecified intestinal obstruction: Secondary | ICD-10-CM | POA: Diagnosis present

## 2015-09-16 DIAGNOSIS — E785 Hyperlipidemia, unspecified: Secondary | ICD-10-CM | POA: Diagnosis present

## 2015-09-16 DIAGNOSIS — Z8673 Personal history of transient ischemic attack (TIA), and cerebral infarction without residual deficits: Secondary | ICD-10-CM | POA: Diagnosis not present

## 2015-09-16 DIAGNOSIS — Z87891 Personal history of nicotine dependence: Secondary | ICD-10-CM | POA: Diagnosis not present

## 2015-09-16 DIAGNOSIS — R1110 Vomiting, unspecified: Secondary | ICD-10-CM | POA: Diagnosis present

## 2015-09-16 DIAGNOSIS — I10 Essential (primary) hypertension: Secondary | ICD-10-CM | POA: Diagnosis present

## 2015-09-16 DIAGNOSIS — Z79899 Other long term (current) drug therapy: Secondary | ICD-10-CM | POA: Diagnosis not present

## 2015-09-16 DIAGNOSIS — Z8546 Personal history of malignant neoplasm of prostate: Secondary | ICD-10-CM | POA: Diagnosis not present

## 2015-09-16 DIAGNOSIS — Z923 Personal history of irradiation: Secondary | ICD-10-CM | POA: Diagnosis not present

## 2015-09-16 DIAGNOSIS — Z85828 Personal history of other malignant neoplasm of skin: Secondary | ICD-10-CM | POA: Diagnosis not present

## 2015-09-16 LAB — CBC
HCT: 41.1 % (ref 39.0–52.0)
HCT: 42 % (ref 39.0–52.0)
HEMOGLOBIN: 13.9 g/dL (ref 13.0–17.0)
Hemoglobin: 14.5 g/dL (ref 13.0–17.0)
MCH: 31 pg (ref 26.0–34.0)
MCH: 31.4 pg (ref 26.0–34.0)
MCHC: 33.8 g/dL (ref 30.0–36.0)
MCHC: 34.5 g/dL (ref 30.0–36.0)
MCV: 90.9 fL (ref 78.0–100.0)
MCV: 91.5 fL (ref 78.0–100.0)
PLATELETS: 237 10*3/uL (ref 150–400)
Platelets: 195 10*3/uL (ref 150–400)
RBC: 4.49 MIL/uL (ref 4.22–5.81)
RBC: 4.62 MIL/uL (ref 4.22–5.81)
RDW: 13 % (ref 11.5–15.5)
RDW: 13.2 % (ref 11.5–15.5)
WBC: 10.7 10*3/uL — ABNORMAL HIGH (ref 4.0–10.5)
WBC: 8.4 10*3/uL (ref 4.0–10.5)

## 2015-09-16 LAB — COMPREHENSIVE METABOLIC PANEL
ALK PHOS: 72 U/L (ref 38–126)
ALT: 26 U/L (ref 17–63)
AST: 30 U/L (ref 15–41)
Albumin: 4 g/dL (ref 3.5–5.0)
Anion gap: 9 (ref 5–15)
BUN: 15 mg/dL (ref 6–20)
CALCIUM: 9.4 mg/dL (ref 8.9–10.3)
CO2: 28 mmol/L (ref 22–32)
CREATININE: 1.16 mg/dL (ref 0.61–1.24)
Chloride: 99 mmol/L — ABNORMAL LOW (ref 101–111)
GFR calc non Af Amer: >60 mL/min (ref >60)
Glucose, Bld: 136 mg/dL — ABNORMAL HIGH (ref 65–99)
Potassium: 4.2 mmol/L (ref 3.5–5.1)
SODIUM: 136 mmol/L (ref 135–145)
Total Bilirubin: 0.5 mg/dL (ref 0.3–1.2)
Total Protein: 7.2 g/dL (ref 6.5–8.1)

## 2015-09-16 LAB — CREATININE, SERUM
Creatinine, Ser: 1.09 mg/dL (ref 0.61–1.24)
GFR calc Af Amer: >60 mL/min (ref >60)
GFR calc non Af Amer: >60 mL/min (ref >60)

## 2015-09-16 LAB — URINALYSIS, ROUTINE W REFLEX MICROSCOPIC
GLUCOSE, UA: NEGATIVE mg/dL
HGB URINE DIPSTICK: NEGATIVE
KETONES UR: 15 mg/dL — AB
LEUKOCYTES UA: NEGATIVE
Nitrite: NEGATIVE
PROTEIN: NEGATIVE mg/dL
Specific Gravity, Urine: 1.024 (ref 1.005–1.030)
Urobilinogen, UA: 1 mg/dL (ref 0.0–1.0)
pH: 5.5 (ref 5.0–8.0)

## 2015-09-16 LAB — LIPASE, BLOOD: Lipase: 33 U/L (ref 22–51)

## 2015-09-16 MED ORDER — SODIUM CHLORIDE 0.9 % IV SOLN
1000.0000 mL | Freq: Once | INTRAVENOUS | Status: AC
  Administered 2015-09-16: 1000 mL via INTRAVENOUS

## 2015-09-16 MED ORDER — SODIUM CHLORIDE 0.9 % IV SOLN
1000.0000 mL | INTRAVENOUS | Status: DC
  Administered 2015-09-16 – 2015-09-18 (×6): 1000 mL via INTRAVENOUS

## 2015-09-16 MED ORDER — ENOXAPARIN SODIUM 40 MG/0.4ML ~~LOC~~ SOLN
40.0000 mg | SUBCUTANEOUS | Status: DC
  Administered 2015-09-16 – 2015-09-18 (×3): 40 mg via SUBCUTANEOUS
  Filled 2015-09-16 (×4): qty 0.4

## 2015-09-16 MED ORDER — PANTOPRAZOLE SODIUM 40 MG IV SOLR
40.0000 mg | Freq: Every day | INTRAVENOUS | Status: DC
  Administered 2015-09-16 – 2015-09-18 (×3): 40 mg via INTRAVENOUS
  Filled 2015-09-16 (×3): qty 40

## 2015-09-16 MED ORDER — ONDANSETRON HCL 4 MG/2ML IJ SOLN
4.0000 mg | Freq: Four times a day (QID) | INTRAMUSCULAR | Status: AC | PRN
  Administered 2015-09-16 (×4): 4 mg via INTRAVENOUS
  Filled 2015-09-16 (×4): qty 2

## 2015-09-16 MED ORDER — MORPHINE SULFATE (PF) 2 MG/ML IV SOLN
2.0000 mg | INTRAVENOUS | Status: DC | PRN
  Administered 2015-09-16 (×2): 2 mg via INTRAVENOUS
  Filled 2015-09-16 (×2): qty 1

## 2015-09-16 MED ORDER — IOHEXOL 300 MG/ML  SOLN
100.0000 mL | Freq: Once | INTRAMUSCULAR | Status: AC | PRN
  Administered 2015-09-16: 100 mL via INTRAVENOUS

## 2015-09-16 MED ORDER — DIPHENHYDRAMINE HCL 50 MG/ML IJ SOLN
25.0000 mg | Freq: Four times a day (QID) | INTRAMUSCULAR | Status: DC | PRN
  Administered 2015-09-16: 25 mg via INTRAVENOUS
  Filled 2015-09-16: qty 1

## 2015-09-16 MED ORDER — MORPHINE SULFATE (PF) 4 MG/ML IV SOLN
8.0000 mg | INTRAVENOUS | Status: AC | PRN
  Administered 2015-09-16 (×3): 8 mg via INTRAVENOUS
  Filled 2015-09-16 (×3): qty 2

## 2015-09-16 NOTE — H&P (Signed)
Matthew Hodges is an 73 y.o. male.   Chief Complaint: vomiting HPI: 73 yo recently discharged from hospital for sbo managed non-surgically. He initially did well at home but then went out to eat had pasta, chicken, bread and afterwards began feeling bloated. He then started vomiting. His last BM was yesterday, his last flatus was on the ER table. He has been hospitalized 4 times for similar events.  Past Medical History  Diagnosis Date  . Cerebral artery disease   . Diverticular disease   . Hyperlipidemia   . Hypertension     hx. of . no meds since 2004  . Prostate cancer (HCC)     treated with radiation  . Basal cell carcinoma     "sqattered"  . Squamous carcinoma (HCC)     "sqattered"  . Heart murmur     "after surgeries before"  . Stroke (HCC) 1979    no deficits    Past Surgical History  Procedure Laterality Date  . Partial colectomy  01/1999    Hartmans procedure with colostomy  . Small bowel repair  03/1999    postop abscesses with fistula.  Abscesses drained.  Fistula repaired.  . Colostomy takedown  08/1999  . Incisional hernia repair  2001    open w mesh  . Laparoscopic incisional / umbilical / ventral hernia repair  2008    with mesh.  LLQ old colostomy site  . Remove suture under anesthesia  2011    stitch abscesses LLQ & midline  . Ventral hernia repair N/A 04/23/2015    Procedure: LAPAROSCOPIC VENTRAL WALL HERNIA REPAIR AND LAP LYSIS OF ADHESIONS;  Surgeon: Steven Gross, MD;  Location: [facility];  Service: General;  Laterality: N/A;  With MESH  . Lysis of adhesion N/A 04/23/2015    Procedure: LYSIS OF ADHESION;  Surgeon: Steven Gross, MD;  Location: [facility];  Service: General;  Laterality: N/A;  . Colon surgery    . Hernia repair      "lots; had diverticulitis years ago"  . Prostate biopsy  ~ 2002    No family history on file. Social History:  reports that he quit smoking about 10 years ago. His smoking use included Cigarettes. He has a 94 pack-year smoking  history. He has never used smokeless tobacco. He reports that he drinks alcohol. He reports that he does not use illicit drugs.  Allergies:  Allergies  Allergen Reactions  . Latex Rash     (Not in a hospital admission)  Results for orders placed or performed during the hospital encounter of 09/15/15 (from the past 48 hour(s))  Lipase, blood     Status: None   Collection Time: 09/15/15 11:51 PM  Result Value Ref Range   Lipase 33 22 - 51 U/L  Comprehensive metabolic panel     Status: Abnormal   Collection Time: 09/15/15 11:51 PM  Result Value Ref Range   Sodium 136 135 - 145 mmol/L   Potassium 4.2 3.5 - 5.1 mmol/L   Chloride 99 (L) 101 - 111 mmol/L   CO2 28 22 - 32 mmol/L   Glucose, Bld 136 (H) 65 - 99 mg/dL   BUN 15 6 - 20 mg/dL   Creatinine, Ser 1.16 0.61 - 1.24 mg/dL   Calcium 9.4 8.9 - 10.3 mg/dL   Total Protein 7.2 6.5 - 8.1 g/dL   Albumin 4.0 3.5 - 5.0 g/dL   AST 30 15 - 41 U/L   ALT 26 17 - 63 U/L     Alkaline Phosphatase 72 38 - 126 U/L   Total Bilirubin 0.5 0.3 - 1.2 mg/dL   GFR calc non Af Amer >60 >60 mL/min   GFR calc Af Amer >60 >60 mL/min    Comment: (NOTE) The eGFR has been calculated using the CKD EPI equation. This calculation has not been validated in all clinical situations. eGFR's persistently <60 mL/min signify possible Chronic Kidney Disease.    Anion gap 9 5 - 15  CBC     Status: Abnormal   Collection Time: 09/15/15 11:51 PM  Result Value Ref Range   WBC 10.7 (H) 4.0 - 10.5 K/uL   RBC 4.62 4.22 - 5.81 MIL/uL   Hemoglobin 14.5 13.0 - 17.0 g/dL   HCT 42.0 39.0 - 52.0 %   MCV 90.9 78.0 - 100.0 fL   MCH 31.4 26.0 - 34.0 pg   MCHC 34.5 30.0 - 36.0 g/dL   RDW 13.0 11.5 - 15.5 %   Platelets 237 150 - 400 K/uL  Urinalysis, Routine w reflex microscopic (not at [facility])     Status: Abnormal   Collection Time: 09/16/15  1:45 AM  Result Value Ref Range   Color, Urine YELLOW YELLOW   APPearance CLEAR CLEAR   Specific Gravity, Urine 1.024 1.005 - 1.030    pH 5.5 5.0 - 8.0   Glucose, UA NEGATIVE NEGATIVE mg/dL   Hgb urine dipstick NEGATIVE NEGATIVE   Bilirubin Urine SMALL (A) NEGATIVE   Ketones, ur 15 (A) NEGATIVE mg/dL   Protein, ur NEGATIVE NEGATIVE mg/dL   Urobilinogen, UA 1.0 0.0 - 1.0 mg/dL   Nitrite NEGATIVE NEGATIVE   Leukocytes, UA NEGATIVE NEGATIVE    Comment: MICROSCOPIC NOT DONE ON URINES WITH NEGATIVE PROTEIN, BLOOD, LEUKOCYTES, NITRITE, OR GLUCOSE <1000 mg/dL.   Ct Abdomen Pelvis W Contrast  09/16/2015  CLINICAL DATA:  Abdominal pain and vomiting. Recent small bowel obstruction, with hospital admission. Questioned recurrent small bowel obstruction. EXAM: CT ABDOMEN AND PELVIS WITH CONTRAST TECHNIQUE: Multidetector CT imaging of the abdomen and pelvis was performed using the standard protocol following bolus administration of intravenous contrast. CONTRAST:  132m OMNIPAQUE IOHEXOL 300 MG/ML  SOLN COMPARISON:  CT 09/12/2015 FINDINGS: Lower chest: The included lung bases are clear. Coronary artery calcifications again seen. Liver: Tiny subcentimeter hypodensities in the right lobe of the liver, too small to characterize, likely cysts. No suspicious solid lesions. Hepatobiliary: Gallbladder is mildly distended. No pericholecystic inflammatory change. Mild biliary prominence, stable. Pancreas: Normal. Spleen: Normal. Adrenal glands: No nodule. Kidneys: Horseshoe kidney configuration, no hydronephrosis homogeneous enhancement. 9 mm cyst in the medial right kidney, with 8 mm cyst in the anterior left kidney, unchanged. Mild perinephric stranding, unchanged. Stomach/Bowel: Recurrent or persistent dilated small bowel loops, adherent to the anterior abdominal wall. Transition from dilated to nondilated bowel loops in the right upper quadrant, similar in appearance to prior. No pneumatosis. Stomach is distended with contrast. Distal small bowel loops are decompressed. There is moderate stool throughout the colon. Diverticulosis of the colon without  diverticulitis. Vascular/Lymphatic: No retroperitoneal adenopathy. Abdominal aorta is densely calcified. Atheromatous calcification leads to severe at luminal narrowing of the distal abdominal aorta. Reproductive: Prostate gland is normal in size. Bladder: Minimally distended, equivocal bladder wall thickening versus nondistention. Other: No free air, free fluid, or intra-abdominal fluid collection. Fat within the both inguinal canals, right greater than left. Postsurgical change of the anterior abdominal wall with mesh. Musculoskeletal: There are no acute or suspicious osseous abnormalities. Multilevel degenerative change in the spine. IMPRESSION: 1.  Findings consistent with recurrent small bowel obstruction, with adhesions to the anterior abdominal wall and transition point in the right upper quadrant. 2. Stable chronic findings include diverticulosis of the colon without diverticulitis, severe atherosclerosis of the abdominal aorta with luminal narrowing, horseshoe kidney with renal cysts. Electronically Signed   By: Jeb Levering M.D.   On: 09/16/2015 04:08    Review of Systems  Constitutional: Negative for fever and chills.  HENT: Negative for hearing loss.   Eyes: Negative for blurred vision and double vision.  Respiratory: Negative for cough and hemoptysis.   Cardiovascular: Negative for chest pain and palpitations.  Gastrointestinal: Positive for nausea, vomiting and abdominal pain. Negative for diarrhea and constipation.  Genitourinary: Negative for dysuria and urgency.  Musculoskeletal: Negative for myalgias and neck pain.  Skin: Negative for itching and rash.  Neurological: Negative for dizziness, tingling and headaches.  Endo/Heme/Allergies: Does not bruise/bleed easily.  Psychiatric/Behavioral: Negative for depression and suicidal ideas.    Blood pressure 158/72, pulse 75, temperature 98.4 F (36.9 C), temperature source Oral, resp. rate 16, SpO2 98 %. Physical Exam  Vitals  reviewed. Constitutional: He is oriented to person, place, and time. He appears well-developed and well-nourished.  HENT:  Head: Normocephalic and atraumatic.  Eyes: Conjunctivae and EOM are normal. Pupils are equal, round, and reactive to light.  Neck: Normal range of motion. Neck supple.  Cardiovascular: Normal rate and regular rhythm.   Respiratory: Effort normal and breath sounds normal.  GI: He exhibits distension.  Midline scar  Musculoskeletal: Normal range of motion.  Neurological: He is alert and oriented to person, place, and time.  Skin: Skin is warm and dry.  Psychiatric: He has a normal mood and affect. His behavior is normal.     Assessment/Plan 74 yo male with recurrent SBO. No leukocytosis, CT shows transition point. No cardinal signs of ischemia. 1L out from NG so far. -admit -NPO -NG tube -IV fluids -pain control  Arta Bruce Kinsinger 09/16/2015, 6:30 AM

## 2015-09-16 NOTE — ED Provider Notes (Signed)
CSN: [id number]     Arrival date & time 09/15/15  2311 History  By signing my name below, I, Hansel Feinstein, attest that this documentation has been prepared under the direction and in the presence of Jola Schmidt, MD. Electronically Signed: Hansel Feinstein, ED Scribe. 09/16/2015. 1:09 AM.    Chief Complaint  Patient presents with  . Abdominal Pain   The history is provided by the patient. No language interpreter was used.    HPI Comments: Matthew Hodges is a 74 y.o. male with h/o ruptured diverticulitis, multiple hernia repairs and abdominal surgeries, Ivh with mesh placement (03/2015), SBO, HLD, HTN, multiple cancers who presents to the Emergency Department complaining of moderate, intermittent lower abdominal pain onset tonight with associated abdominal distention, nausea, an episode of emesis. Pt was recently hospitalized for SBO 4 days ago and was discharged the next day. He did not require surgical intervention or NGT; pain was managed with morphine. He was able to pass gas and tolerate fluids. He reports his current pain is similar to pains he had with this SBO. Pt endorses that he had a large meal tonight. He notes that he has been able to pass some gas tonight. Pt states his nausea has subsided.   Past Medical History  Diagnosis Date  . Cerebral artery disease   . Diverticular disease   . Hyperlipidemia   . Hypertension     hx. of . no meds since 2004  . Prostate cancer [facility])     treated with radiation  . Basal cell carcinoma     "sqattered"  . Squamous carcinoma ([place])     "sqattered"  . Heart murmur     "after surgeries before"  . Stroke [facility]) 1979    no deficits   Past Surgical History  Procedure Laterality Date  . Partial colectomy  01/1999    Hartmans procedure with colostomy  . Small bowel repair  03/1999    postop abscesses with fistula.  Abscesses drained.  Fistula repaired.  . Colostomy takedown  08/1999  . Incisional hernia repair  2001    open w mesh  . Laparoscopic  incisional / umbilical / ventral hernia repair  2008    with mesh.  LLQ old colostomy site  . Remove suture under anesthesia  2011    stitch abscesses LLQ & midline  . Ventral hernia repair N/A 04/23/2015    Procedure: LAPAROSCOPIC VENTRAL WALL HERNIA REPAIR AND LAP LYSIS OF ADHESIONS;  Surgeon: Michael Boston, MD;  Location: [facility];  Service: General;  Laterality: N/A;  With MESH  . Lysis of adhesion N/A 04/23/2015    Procedure: LYSIS OF ADHESION;  Surgeon: Michael Boston, MD;  Location: [facility];  Service: General;  Laterality: N/A;  . Colon surgery    . Hernia repair      "lots; had diverticulitis years ago"  . Prostate biopsy  ~ 2002   No family history on file. Social History  Substance Use Topics  . Smoking status: Former Smoker -- 2.00 packs/day for 47 years    Types: Cigarettes    Quit date: 11/30/2004  . Smokeless tobacco: Never Used  . 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    Allergies  Latex  Home Medications   Prior to Admission medications   Medication  Sig Start Date End Date Taking? Authorizing Provider  aspirin EC 81 MG tablet Take 81 mg by mouth every morning.     Historical Provider, MD  atorvastatin (LIPITOR) 40 MG tablet Take 20 mg by mouth at bedtime.    Historical Provider, MD  Cholecalciferol (VITAMIN D3) 400 UNITS tablet Take 400 Units by mouth every morning.     Historical Provider, MD  ibuprofen (ADVIL,MOTRIN) 200 MG tablet Take 400 mg by mouth every 6 (six) hours as needed for headache or moderate pain.    Historical Provider, MD  mirabegron ER (MYRBETRIQ) 25 MG TB24 tablet Take 25 mg by mouth every morning.    Historical Provider, MD  Multiple Vitamins-Minerals (MEGA MULTIVITAMIN FOR MEN PO) Take 1 tablet by mouth every morning.     Historical Provider, MD  omega-3 acid ethyl esters (LOVAZA) 1  G capsule Take 1 g by mouth every morning.     Historical Provider, MD  oxyCODONE (OXY IR/ROXICODONE) 5 MG immediate release tablet Take 1-2 tablets (5-10 mg total) by mouth every 6 (six) hours as needed for moderate pain, severe pain or breakthrough pain. Patient not taking: Reported on 09/12/2015 04/23/15   Michael Boston, MD  sildenafil (VIAGRA) 100 MG tablet Take 100 mg by mouth daily as needed for erectile dysfunction.     Historical Provider, MD   BP 163/68 mmHg  Pulse 67  Temp(Src) 98.4 F (36.9 C) (Oral)  Resp 16  SpO2 93% Physical Exam  Constitutional: He is oriented to person, place, and time. He appears well-developed and well-nourished.  HENT:  Head: Normocephalic and atraumatic.  Eyes: EOM are normal.  Neck: Normal range of motion.  Cardiovascular: Normal rate, regular rhythm, normal heart sounds and intact distal pulses.   Pulmonary/Chest: Effort normal and breath sounds normal. No respiratory distress.  Lungs CTA bilaterally.   Abdominal: Soft. He exhibits distension. There is tenderness.  Mild abdominal distension; mild generalized tenderness.  Musculoskeletal: Normal range of motion.  Neurological: He is alert and oriented to person, place, and time.  Skin: Skin is warm and dry.  Psychiatric: He has a normal mood and affect. Judgment normal.  Nursing note and vitals reviewed.  ED Course  Procedures (including critical care time) DIAGNOSTIC STUDIES: Oxygen Saturation is 93% on RA, adequate by my interpretation.    COORDINATION OF CARE: 1:07 AM Discussed treatment plan with pt at bedside and pt agreed to plan.   Labs Review Labs Reviewed  COMPREHENSIVE METABOLIC PANEL - Abnormal; Notable for the following:    Chloride 99 (*)    Glucose, Bld 136 (*)    All other components within normal limits  CBC - Abnormal; Notable for the following:    WBC 10.7 (*)    All other components within normal limits  URINALYSIS, ROUTINE W REFLEX MICROSCOPIC (NOT AT [facility]) -  Abnormal; Notable for the following:    Bilirubin Urine SMALL (*)    Ketones, ur 15 (*)    All other components within normal limits  LIPASE, BLOOD    Imaging Review Ct Abdomen Pelvis W Contrast  09/16/2015  CLINICAL DATA:  Abdominal pain and vomiting. Recent small bowel obstruction, with hospital admission. Questioned recurrent small bowel obstruction. EXAM: CT ABDOMEN AND PELVIS WITH CONTRAST TECHNIQUE: Multidetector CT imaging of the abdomen and pelvis was performed using the standard protocol following bolus administration of intravenous contrast. CONTRAST:  138mL OMNIPAQUE IOHEXOL 300 MG/ML  SOLN COMPARISON:  CT 09/12/2015 FINDINGS: Lower chest: The included lung bases are clear.  Coronary artery calcifications again seen. Liver: Tiny subcentimeter hypodensities in the right lobe of the liver, too small to characterize, likely cysts. No suspicious solid lesions. Hepatobiliary: Gallbladder is mildly distended. No pericholecystic inflammatory change. Mild biliary prominence, stable. Pancreas: Normal. Spleen: Normal. Adrenal glands: No nodule. Kidneys: Horseshoe kidney configuration, no hydronephrosis homogeneous enhancement. 9 mm cyst in the medial right kidney, with 8 mm cyst in the anterior left kidney, unchanged. Mild perinephric stranding, unchanged. Stomach/Bowel: Recurrent or persistent dilated small bowel loops, adherent to the anterior abdominal wall. Transition from dilated to nondilated bowel loops in the right upper quadrant, similar in appearance to prior. No pneumatosis. Stomach is distended with contrast. Distal small bowel loops are decompressed. There is moderate stool throughout the colon. Diverticulosis of the colon without diverticulitis. Vascular/Lymphatic: No retroperitoneal adenopathy. Abdominal aorta is densely calcified. Atheromatous calcification leads to severe at luminal narrowing of the distal abdominal aorta. Reproductive: Prostate gland is normal in size. Bladder: Minimally  distended, equivocal bladder wall thickening versus nondistention. Other: No free air, free fluid, or intra-abdominal fluid collection. Fat within the both inguinal canals, right greater than left. Postsurgical change of the anterior abdominal wall with mesh. Musculoskeletal: There are no acute or suspicious osseous abnormalities. Multilevel degenerative change in the spine. IMPRESSION: 1. Findings consistent with recurrent small bowel obstruction, with adhesions to the anterior abdominal wall and transition point in the right upper quadrant. 2. Stable chronic findings include diverticulosis of the colon without diverticulitis, severe atherosclerosis of the abdominal aorta with luminal narrowing, horseshoe kidney with renal cysts. Electronically Signed   By: Jeb Levering M.D.   On: 09/16/2015 04:08   I have personally reviewed and evaluated these images and lab results as part of my medical decision-making.  MDM   Final diagnoses:  SBO (small bowel obstruction) ([place])    Patient with recurrent small bowel obstruction.  NG tube place.  General surgery consultation and admission.   I, Jeronica Stlouis M, personally performed the services described in this documentation. All medical record entries made by the scribe were at my direction and in my presence.  I have reviewed the chart and discharge instructions and agree that the record reflects my personal performance and is accurate and complete. Jamee Pacholski M.  09/16/2015. 5:52 AM.       Jola Schmidt, MD 09/16/15 [phone]

## 2015-09-16 NOTE — ED Notes (Signed)
14 Fr. Naso-gastric tube placed into left nostril. Patient tolerated procedure well. Pt with 600 cc of gastric fluid emptied within 5 minutes of procedure.

## 2015-09-17 ENCOUNTER — Inpatient Hospital Stay (HOSPITAL_COMMUNITY): Payer: Medicare Other

## 2015-09-17 DIAGNOSIS — I10 Essential (primary) hypertension: Secondary | ICD-10-CM | POA: Diagnosis present

## 2015-09-17 DIAGNOSIS — E785 Hyperlipidemia, unspecified: Secondary | ICD-10-CM | POA: Diagnosis present

## 2015-09-17 DIAGNOSIS — I679 Cerebrovascular disease, unspecified: Secondary | ICD-10-CM | POA: Diagnosis present

## 2015-09-17 LAB — BASIC METABOLIC PANEL
ANION GAP: 6 (ref 5–15)
BUN: 11 mg/dL (ref 6–20)
CO2: 29 mmol/L (ref 22–32)
CREATININE: 1.03 mg/dL (ref 0.61–1.24)
Calcium: 8.2 mg/dL — ABNORMAL LOW (ref 8.9–10.3)
Chloride: 105 mmol/L (ref 101–111)
GFR calc non Af Amer: >60 mL/min (ref >60)
Glucose, Bld: 84 mg/dL (ref 65–99)
Potassium: 3.8 mmol/L (ref 3.5–5.1)
SODIUM: 140 mmol/L (ref 135–145)

## 2015-09-17 LAB — PHOSPHORUS: PHOSPHORUS: 3.3 mg/dL (ref 2.5–4.6)

## 2015-09-17 LAB — GLUCOSE, CAPILLARY: Glucose-Capillary: 94 mg/dL (ref 65–99)

## 2015-09-17 LAB — CBC
HCT: 36.3 % — ABNORMAL LOW (ref 39.0–52.0)
HEMOGLOBIN: 12 g/dL — AB (ref 13.0–17.0)
MCH: 31.1 pg (ref 26.0–34.0)
MCHC: 33.1 g/dL (ref 30.0–36.0)
MCV: 94 fL (ref 78.0–100.0)
Platelets: 181 10*3/uL (ref 150–400)
RBC: 3.86 MIL/uL — ABNORMAL LOW (ref 4.22–5.81)
RDW: 13.4 % (ref 11.5–15.5)
WBC: 5 10*3/uL (ref 4.0–10.5)

## 2015-09-17 LAB — MAGNESIUM: MAGNESIUM: 1.7 mg/dL (ref 1.7–2.4)

## 2015-09-17 MED ORDER — MAGNESIUM SULFATE 2 GM/50ML IV SOLN
2.0000 g | Freq: Once | INTRAVENOUS | Status: AC
  Administered 2015-09-17: 2 g via INTRAVENOUS
  Filled 2015-09-17: qty 50

## 2015-09-17 MED ORDER — METOCLOPRAMIDE HCL 5 MG/ML IJ SOLN: 5.0000 mg | Freq: Four times a day (QID) | INTRAMUSCULAR | Status: DC | PRN

## 2015-09-17 MED ORDER — DIATRIZOATE MEGLUMINE & SODIUM 66-10 % PO SOLN
90.0000 mL | Freq: Once | ORAL | Status: AC
Start: 2015-09-17 — End: 2015-09-17
  Administered 2015-09-17: 90 mL via NASOGASTRIC
  Filled 2015-09-17: qty 90

## 2015-09-17 MED ORDER — METOPROLOL TARTRATE 1 MG/ML IV SOLN: 5.0000 mg | Freq: Four times a day (QID) | INTRAVENOUS | Status: DC | PRN

## 2015-09-17 MED ORDER — HYDROMORPHONE HCL 1 MG/ML IJ SOLN: 0.5000 mg | INTRAMUSCULAR | Status: DC | PRN

## 2015-09-17 MED ORDER — LIP MEDEX EX OINT: 1.0000 "application " | TOPICAL_OINTMENT | Freq: Two times a day (BID) | CUTANEOUS | Status: DC

## 2015-09-17 MED ORDER — MAGIC MOUTHWASH: 15.0000 mL | Freq: Four times a day (QID) | ORAL | Status: DC | PRN

## 2015-09-17 MED ORDER — BISACODYL 10 MG RE SUPP
10.0000 mg | Freq: Every day | RECTAL | Status: DC
  Administered 2015-09-17: 10 mg via RECTAL
  Filled 2015-09-17 (×4): qty 1

## 2015-09-17 MED ORDER — PROMETHAZINE HCL 25 MG/ML IJ SOLN: 6.2500 mg | INTRAMUSCULAR | Status: DC | PRN

## 2015-09-17 MED ORDER — MENTHOL 3 MG MT LOZG: 1.0000 | LOZENGE | OROMUCOSAL | Status: DC | PRN

## 2015-09-17 MED ORDER — ONDANSETRON 4 MG PO TBDP: 4.0000 mg | ORAL_TABLET | Freq: Four times a day (QID) | ORAL | Status: DC | PRN

## 2015-09-17 MED ORDER — ALUM & MAG HYDROXIDE-SIMETH 200-200-20 MG/5ML PO SUSP
30.0000 mL | Freq: Four times a day (QID) | ORAL | Status: DC | PRN
Start: 2015-09-17 — End: 2015-09-20

## 2015-09-17 MED ORDER — ONDANSETRON HCL 4 MG/2ML IJ SOLN: 4.0000 mg | Freq: Four times a day (QID) | INTRAMUSCULAR | Status: DC | PRN

## 2015-09-17 MED ORDER — PHENOL 1.4 % MT LIQD: 2.0000 | OROMUCOSAL | Status: DC | PRN

## 2015-09-17 MED ORDER — BLISTEX MEDICATED EX OINT
TOPICAL_OINTMENT | CUTANEOUS | Status: DC | PRN
  Filled 2015-09-17: qty 10

## 2015-09-17 MED ORDER — SODIUM CHLORIDE 0.9 % IV SOLN
8.0000 mg | Freq: Four times a day (QID) | INTRAVENOUS | Status: DC | PRN
  Filled 2015-09-17: qty 4

## 2015-09-17 MED ORDER — ACETAMINOPHEN 650 MG RE SUPP: 650.0000 mg | Freq: Four times a day (QID) | RECTAL | Status: DC | PRN

## 2015-09-17 MED ORDER — LACTATED RINGERS IV BOLUS (SEPSIS): 1000.0000 mL | Freq: Three times a day (TID) | INTRAVENOUS | Status: AC | PRN

## 2015-09-17 MED ORDER — DIPHENHYDRAMINE HCL 50 MG/ML IJ SOLN
12.5000 mg | Freq: Four times a day (QID) | INTRAMUSCULAR | Status: DC | PRN
  Administered 2015-09-17 – 2015-09-18 (×2): 25 mg via INTRAVENOUS
  Filled 2015-09-17 (×2): qty 1

## 2015-09-17 NOTE — Progress Notes (Signed)
CENTRAL  SURGERY  [address] [place], [place] [address] Phone: [phone] FAX: [phone]   Matthew Hodges [id number] 10-24-1941   Problem List:   Principal Problem:   SBO (small bowel obstruction) [facility]) Active Problems:   Recurrent ventral incisional hernia s/p lap re-repair w mesh May 2016   Hypertension   Cerebral artery disease   Hyperlipidemia      *   04/23/2015  PATIENT: Matthew Hodges 74 y.o. male  Patient Care Team: Darcus Austin, MD as PCP - General (Family Medicine) Druscilla Brownie, MD (Dermatology) Carolan Clines, MD as Attending Physician (Urology)  PRE-OPERATIVE DIAGNOSIS: Incisonal Ventral Wall Abdominal Hernia  POST-OPERATIVE DIAGNOSIS: Incisonal Ventral Wall Abdominal Hernia AND ADHESIONS  PROCEDURE: LAPAROSCOPIC VENTRAL [place] with mesh LAPAROSCOPIC LYSIS OF ADHESIONS X 2 HOURS (66% of case)  OR FINDINGS:   Swiss cheese hernias noted an central region. Prior onlay polypropylene mesh with holes within the mesh. Those are the most obvious thinned out areas. 12x8 cm swiss cheese region. Moderate diastases/hernation in central abdomen 21 x 17 cm region. Prior left lower quadrant colostomy hernia repair intact with Parietex mesh.  Very dense adhesions of small bowel & colon to mesh.Greater omentum stuck in left upper quadrant.    Type of repair - Laparoscopic underlay repair  Name of mesh - Bard Ventralight dual sided (polypropylene / Seprafilm)  Size of mesh - Length 33 cm, Width 28 cm  Mesh overlap - 5-7 cm  Placement of mesh - Intraperitoneal underlay repair  SURGEON: Surgeon(s): Michael Boston, MD        Assessment  Recurrent PSBO with transition to anterior abdominal wall  Plan:  -SBO protocol - prob would do clamping trial & not just d/c NGT right away even if looks good (that is what happened last time & here we are...   -I suspect pt will  need lap LOA to fix this with prob SB fecalization & early bounce back w SBO but we will see.  Higher risk of SB injury w ## surgeries though.  We will see...  The anatomy & physiology of the digestive tract was discussed.  The pathophysiology of intestinal obstruction was discussed.  Natural history risks without surgery was discussed.   I feel the patient has failed non-operative therapies.  The risks of no intervention will lead to serious problems such as necrosis, perforation, dehydration, etc. that outweigh the operative risks; therefore, I recommended abdominal exploration to diagnose & treat the source of the problem.  Minimally Invasive & open techniques were discussed.   I expressed a good likelihood that surgery will treat the problem.  Risks such as bleeding, infection, abscess, leak, reoperation, bowel resection, possible ostomy, hernia, injury to other organs, need for repair of tissues / organs, need for further treatment, heart attack, death, and other risks were discussed.   I noted a good likelihood this will help address the problem.  Goals of post-operative recovery were discussed as well.  We will work to minimize complications. Questions were answered.  The patient expresses understanding & wishes to hold off on surgery.   -IVF -NGT advanced & unclogged by me -try enema -HTN control -VTE prophylaxis- SCDs, etc -mobilize as tolerated to help recovery  [name], M.D., F.A.C.S. Gastrointestinal and Minimally Invasive Surgery [facility] [address], [place], [place] [address] [phone] Main / Paging   09/17/2015  Subjective:  Sore nose  Objective:  Vital signs:  Filed Vitals:   09/16/15 1752 09/16/15 2139 09/17/15 0154 09/17/15 0519  BP: 132/64 146/66 147/62 136/73  Pulse: 80 77 74 72  Temp: 98.7 F (37.1 C) 99.1 F (37.3 C) 98.6 F (37 C) 98.6 F (37 C)  TempSrc: Oral Oral Oral Oral  Resp: $Remo'16 16 16 16  'TtsEL$ SpO2:  95% 92% 95% 94%    Last BM Date: 09/15/15  Intake/Output   Yesterday:  10/17 0701 - 10/18 0700 In: 3841.7 [I.V.:3841.7] Out: 1775 [Urine:775; Emesis/NG output:400] This shift:     Bowel function:  Flatus: y  BM: n  Drain: thick brown  Physical Exam:  General: Pt awake/alert/oriented x4 in no acute distress Eyes: PERRL, normal EOM.  Sclera clear.  No icterus Neuro: CN II-XII intact w/o focal sensory/motor deficits. Lymph: No head/neck/groin lymphadenopathy Psych:  No delerium/psychosis/paranoia HENT: Normocephalic, Mucus membranes moist.  No thrush Neck: Supple, No tracheal deviation Chest: No chest wall pain w good excursion CV:  Pulses intact.  Regular rhythm MS: Normal AROM mjr joints.  No obvious deformity Abdomen: Soft.  Mod distended. Min tender.  No evidence of peritonitis.  No incarcerated hernias. Ext:  SCDs BLE.  No mjr edema.  No cyanosis Skin: No petechiae / purpura  Results:   Labs: Results for orders placed or performed during the hospital encounter of 09/15/15 (from the past 48 hour(s))  Lipase, blood     Status: None   Collection Time: 09/15/15 11:51 PM  Result Value Ref Range   Lipase 33 22 - 51 U/L  Comprehensive metabolic panel     Status: Abnormal   Collection Time: 09/15/15 11:51 PM  Result Value Ref Range   Sodium 136 135 - 145 mmol/L   Potassium 4.2 3.5 - 5.1 mmol/L   Chloride 99 (L) 101 - 111 mmol/L   CO2 28 22 - 32 mmol/L   Glucose, Bld 136 (H) 65 - 99 mg/dL   BUN 15 6 - 20 mg/dL   Creatinine, Ser 1.16 0.61 - 1.24 mg/dL   Calcium 9.4 8.9 - 10.3 mg/dL   Total Protein 7.2 6.5 - 8.1 g/dL   Albumin 4.0 3.5 - 5.0 g/dL   AST 30 15 - 41 U/L   ALT 26 17 - 63 U/L   Alkaline Phosphatase 72 38 - 126 U/L   Total Bilirubin 0.5 0.3 - 1.2 mg/dL   GFR calc non Af Amer >60 >60 mL/min   GFR calc Af Amer >60 >60 mL/min    Comment: (NOTE) The eGFR has been calculated using the CKD EPI equation. This calculation has not been validated in all clinical  situations. eGFR's persistently <60 mL/min signify possible Chronic Kidney Disease.    Anion gap 9 5 - 15  CBC     Status: Abnormal   Collection Time: 09/15/15 11:51 PM  Result Value Ref Range   WBC 10.7 (H) 4.0 - 10.5 K/uL   RBC 4.62 4.22 - 5.81 MIL/uL   Hemoglobin 14.5 13.0 - 17.0 g/dL   HCT 42.0 39.0 - 52.0 %   MCV 90.9 78.0 - 100.0 fL   MCH 31.4 26.0 - 34.0 pg   MCHC 34.5 30.0 - 36.0 g/dL   RDW 13.0 11.5 - 15.5 %   Platelets 237 150 - 400 K/uL  Urinalysis, Routine w reflex microscopic (not at [facility])     Status: Abnormal   Collection Time: 09/16/15  1:45 AM  Result Value Ref Range   Color, Urine YELLOW YELLOW   APPearance CLEAR CLEAR  Specific Gravity, Urine 1.024 1.005 - 1.030   pH 5.5 5.0 - 8.0   Glucose, UA NEGATIVE NEGATIVE mg/dL   Hgb urine dipstick NEGATIVE NEGATIVE   Bilirubin Urine SMALL (A) NEGATIVE   Ketones, ur 15 (A) NEGATIVE mg/dL   Protein, ur NEGATIVE NEGATIVE mg/dL   Urobilinogen, UA 1.0 0.0 - 1.0 mg/dL   Nitrite NEGATIVE NEGATIVE   Leukocytes, UA NEGATIVE NEGATIVE    Comment: MICROSCOPIC NOT DONE ON URINES WITH NEGATIVE PROTEIN, BLOOD, LEUKOCYTES, NITRITE, OR GLUCOSE <1000 mg/dL.  CBC     Status: None   Collection Time: 09/16/15  6:44 AM  Result Value Ref Range   WBC 8.4 4.0 - 10.5 K/uL   RBC 4.49 4.22 - 5.81 MIL/uL   Hemoglobin 13.9 13.0 - 17.0 g/dL   HCT 41.1 39.0 - 52.0 %   MCV 91.5 78.0 - 100.0 fL   MCH 31.0 26.0 - 34.0 pg   MCHC 33.8 30.0 - 36.0 g/dL   RDW 13.2 11.5 - 15.5 %   Platelets 195 150 - 400 K/uL  Creatinine, serum     Status: None   Collection Time: 09/16/15  6:44 AM  Result Value Ref Range   Creatinine, Ser 1.09 0.61 - 1.24 mg/dL   GFR calc non Af Amer >60 >60 mL/min   GFR calc Af Amer >60 >60 mL/min    Comment: (NOTE) The eGFR has been calculated using the CKD EPI equation. This calculation has not been validated in all clinical situations. eGFR's persistently <60 mL/min signify possible Chronic Kidney Disease.   Basic  metabolic panel     Status: Abnormal   Collection Time: 09/17/15  3:00 AM  Result Value Ref Range   Sodium 140 135 - 145 mmol/L   Potassium 3.8 3.5 - 5.1 mmol/L   Chloride 105 101 - 111 mmol/L   CO2 29 22 - 32 mmol/L   Glucose, Bld 84 65 - 99 mg/dL   BUN 11 6 - 20 mg/dL   Creatinine, Ser 1.03 0.61 - 1.24 mg/dL   Calcium 8.2 (L) 8.9 - 10.3 mg/dL   GFR calc non Af Amer >60 >60 mL/min   GFR calc Af Amer >60 >60 mL/min    Comment: (NOTE) The eGFR has been calculated using the CKD EPI equation. This calculation has not been validated in all clinical situations. eGFR's persistently <60 mL/min signify possible Chronic Kidney Disease.    Anion gap 6 5 - 15  Magnesium     Status: None   Collection Time: 09/17/15  3:00 AM  Result Value Ref Range   Magnesium 1.7 1.7 - 2.4 mg/dL  Phosphorus     Status: None   Collection Time: 09/17/15  3:00 AM  Result Value Ref Range   Phosphorus 3.3 2.5 - 4.6 mg/dL  CBC     Status: Abnormal   Collection Time: 09/17/15  3:00 AM  Result Value Ref Range   WBC 5.0 4.0 - 10.5 K/uL   RBC 3.86 (L) 4.22 - 5.81 MIL/uL   Hemoglobin 12.0 (L) 13.0 - 17.0 g/dL   HCT 36.3 (L) 39.0 - 52.0 %   MCV 94.0 78.0 - 100.0 fL   MCH 31.1 26.0 - 34.0 pg   MCHC 33.1 30.0 - 36.0 g/dL   RDW 13.4 11.5 - 15.5 %   Platelets 181 150 - 400 K/uL    Imaging / Studies: Ct Abdomen Pelvis W Contrast  09/16/2015  CLINICAL DATA:  Abdominal pain and vomiting. Recent small bowel obstruction, with  hospital admission. Questioned recurrent small bowel obstruction. EXAM: CT ABDOMEN AND PELVIS WITH CONTRAST TECHNIQUE: Multidetector CT imaging of the abdomen and pelvis was performed using the standard protocol following bolus administration of intravenous contrast. CONTRAST:  184mL OMNIPAQUE IOHEXOL 300 MG/ML  SOLN COMPARISON:  CT 09/12/2015 FINDINGS: Lower chest: The included lung bases are clear. Coronary artery calcifications again seen. Liver: Tiny subcentimeter hypodensities in the right lobe  of the liver, too small to characterize, likely cysts. No suspicious solid lesions. Hepatobiliary: Gallbladder is mildly distended. No pericholecystic inflammatory change. Mild biliary prominence, stable. Pancreas: Normal. Spleen: Normal. Adrenal glands: No nodule. Kidneys: Horseshoe kidney configuration, no hydronephrosis homogeneous enhancement. 9 mm cyst in the medial right kidney, with 8 mm cyst in the anterior left kidney, unchanged. Mild perinephric stranding, unchanged. Stomach/Bowel: Recurrent or persistent dilated small bowel loops, adherent to the anterior abdominal wall. Transition from dilated to nondilated bowel loops in the right upper quadrant, similar in appearance to prior. No pneumatosis. Stomach is distended with contrast. Distal small bowel loops are decompressed. There is moderate stool throughout the colon. Diverticulosis of the colon without diverticulitis. Vascular/Lymphatic: No retroperitoneal adenopathy. Abdominal aorta is densely calcified. Atheromatous calcification leads to severe at luminal narrowing of the distal abdominal aorta. Reproductive: Prostate gland is normal in size. Bladder: Minimally distended, equivocal bladder wall thickening versus nondistention. Other: No free air, free fluid, or intra-abdominal fluid collection. Fat within the both inguinal canals, right greater than left. Postsurgical change of the anterior abdominal wall with mesh. Musculoskeletal: There are no acute or suspicious osseous abnormalities. Multilevel degenerative change in the spine. IMPRESSION: 1. Findings consistent with recurrent small bowel obstruction, with adhesions to the anterior abdominal wall and transition point in the right upper quadrant. 2. Stable chronic findings include diverticulosis of the colon without diverticulitis, severe atherosclerosis of the abdominal aorta with luminal narrowing, horseshoe kidney with renal cysts. Electronically Signed   By: Jeb Levering M.D.   On:  09/16/2015 04:08   Dg Abd Portable 1v-small Bowel Protocol-position Verification  09/17/2015  CLINICAL DATA:  NG tube placement . EXAM: PORTABLE ABDOMEN - 1 VIEW COMPARISON:  CT 09/16/2015.  Abdominal series 1014 2016 . FINDINGS: NG tube noted with tip below left hemidiaphragm. Side hole is at the gastroesophageal junction. Slight advancement NG tube should be considered. No bowel distention. Contrast in the colon from prior CT. No free air. Surgical staples are noted over the abdomen. IMPRESSION: 1. NG tube noted with tip in the upper stomach, side hole at the GE junction. Slight advancement should be considered. 2. No acute intra-abdominal abnormality identified. Oral contrast in the colon from prior CT. No bowel distention . Electronically Signed   By: Marcello Moores  Register   On: 09/17/2015 08:27    Medications / Allergies: per chart  Antibiotics: Anti-infectives    None        Note: Portions of this report may have been transcribed using voice recognition software. Every effort was made to ensure accuracy; however, inadvertent computerized transcription errors may be present.   Any transcriptional errors that result from this process are unintentional.     [name], M.D., F.A.C.S. Gastrointestinal and Minimally Invasive Surgery [facility] [address], [place], [place] [address] [phone] Main / Paging   09/17/2015  CARE TEAM:  PCP: Marjorie Smolder, MD  Outpatient Care Team: Patient Care Team: Darcus Austin, MD as PCP - General (Family Medicine) Druscilla Brownie, MD (Dermatology) Carolan Clines, MD as Attending Physician (  Urology)  Inpatient Treatment Team: Treatment Team: Attending Provider: Nolon Nations, MD; Consulting Physician: Md Edison Pace, MD; Technician: Roger Shelter, NT; Registered Nurse: Fransisco Hertz, RN; Consulting Physician: Michael Boston, MD; Registered Nurse: Gardner Candle, RN

## 2015-09-17 NOTE — Progress Notes (Signed)
Patient ID: Matthew Hodges, male   DOB: September 03, 1941, 74 y.o.   MRN: [id number]    Subjective: Pt feels well this morning.  He's hungry.  Passing some flatus  Objective: Vital signs in last 24 hours: Temp:  [98.6 F (37 C)-99.1 F (37.3 C)] 98.6 F (37 C) (10/18 0519) Pulse Rate:  [72-88] 72 (10/18 0519) Resp:  [16] 16 (10/18 0519) BP: (132-158)/(60-79) 136/73 mmHg (10/18 0519) SpO2:  [92 %-100 %] 94 % (10/18 0519) Last BM Date: 09/15/15  Intake/Output from previous day: 10/17 0701 - 10/18 0700 In: 3841.7 [I.V.:3841.7] Out: 1775 [Urine:775; Emesis/NG output:400] Intake/Output this shift: Total I/O In: -  Out: 150 [Urine:150]  PE: Abd: soft, minimal right sided tenderness, +BS, NGT with some bilious output, but decreasing Heart: regular LungS: CTAB  Lab Results:   Recent Labs  09/16/15 0644 09/17/15 0300  WBC 8.4 5.0  HGB 13.9 12.0*  HCT 41.1 36.3*  PLT 195 181   BMET  Recent Labs  09/15/15 2351 09/16/15 0644 09/17/15 0300  NA 136  --  140  K 4.2  --  3.8  CL 99*  --  105  CO2 28  --  29  GLUCOSE 136*  --  84  BUN 15  --  11  CREATININE 1.16 1.09 1.03  CALCIUM 9.4  --  8.2*   PT/INR No results for input(s): LABPROT, INR in the last 72 hours. CMP     Component Value Date/Time   NA 140 09/17/2015 0300   K 3.8 09/17/2015 0300   CL 105 09/17/2015 0300   CO2 29 09/17/2015 0300   GLUCOSE 84 09/17/2015 0300   BUN 11 09/17/2015 0300   CREATININE 1.03 09/17/2015 0300   CALCIUM 8.2* 09/17/2015 0300   PROT 7.2 09/15/2015 2351   ALBUMIN 4.0 09/15/2015 2351   AST 30 09/15/2015 2351   ALT 26 09/15/2015 2351   ALKPHOS 72 09/15/2015 2351   BILITOT 0.5 09/15/2015 2351   GFRNONAA >60 09/17/2015 0300   GFRAA >60 09/17/2015 0300   Lipase     Component Value Date/Time   LIPASE 33 09/15/2015 2351       Studies/Results: Ct Abdomen Pelvis W Contrast  09/16/2015  CLINICAL DATA:  Abdominal pain and vomiting. Recent small bowel obstruction, with hospital  admission. Questioned recurrent small bowel obstruction. EXAM: CT ABDOMEN AND PELVIS WITH CONTRAST TECHNIQUE: Multidetector CT imaging of the abdomen and pelvis was performed using the standard protocol following bolus administration of intravenous contrast. CONTRAST:  171mL OMNIPAQUE IOHEXOL 300 MG/ML  SOLN COMPARISON:  CT 09/12/2015 FINDINGS: Lower chest: The included lung bases are clear. Coronary artery calcifications again seen. Liver: Tiny subcentimeter hypodensities in the right lobe of the liver, too small to characterize, likely cysts. No suspicious solid lesions. Hepatobiliary: Gallbladder is mildly distended. No pericholecystic inflammatory change. Mild biliary prominence, stable. Pancreas: Normal. Spleen: Normal. Adrenal glands: No nodule. Kidneys: Horseshoe kidney configuration, no hydronephrosis homogeneous enhancement. 9 mm cyst in the medial right kidney, with 8 mm cyst in the anterior left kidney, unchanged. Mild perinephric stranding, unchanged. Stomach/Bowel: Recurrent or persistent dilated small bowel loops, adherent to the anterior abdominal wall. Transition from dilated to nondilated bowel loops in the right upper quadrant, similar in appearance to prior. No pneumatosis. Stomach is distended with contrast. Distal small bowel loops are decompressed. There is moderate stool throughout the colon. Diverticulosis of the colon without diverticulitis. Vascular/Lymphatic: No retroperitoneal adenopathy. Abdominal aorta is densely calcified. Atheromatous calcification leads to severe at luminal  narrowing of the distal abdominal aorta. Reproductive: Prostate gland is normal in size. Bladder: Minimally distended, equivocal bladder wall thickening versus nondistention. Other: No free air, free fluid, or intra-abdominal fluid collection. Fat within the both inguinal canals, right greater than left. Postsurgical change of the anterior abdominal wall with mesh. Musculoskeletal: There are no acute or suspicious  osseous abnormalities. Multilevel degenerative change in the spine. IMPRESSION: 1. Findings consistent with recurrent small bowel obstruction, with adhesions to the anterior abdominal wall and transition point in the right upper quadrant. 2. Stable chronic findings include diverticulosis of the colon without diverticulitis, severe atherosclerosis of the abdominal aorta with luminal narrowing, horseshoe kidney with renal cysts. Electronically Signed   By: Jeb Levering M.D.   On: 09/16/2015 04:08   Dg Abd Portable 1v-small Bowel Protocol-position Verification  09/17/2015  CLINICAL DATA:  NG tube placement . EXAM: PORTABLE ABDOMEN - 1 VIEW COMPARISON:  CT 09/16/2015.  Abdominal series 1014 2016 . FINDINGS: NG tube noted with tip below left hemidiaphragm. Side hole is at the gastroesophageal junction. Slight advancement NG tube should be considered. No bowel distention. Contrast in the colon from prior CT. No free air. Surgical staples are noted over the abdomen. IMPRESSION: 1. NG tube noted with tip in the upper stomach, side hole at the GE junction. Slight advancement should be considered. 2. No acute intra-abdominal abnormality identified. Oral contrast in the colon from prior CT. No bowel distention . Electronically Signed   By: Marcello Moores  Register   On: 09/17/2015 08:27    Anti-infectives: Anti-infectives    None       Assessment/Plan  1. Recurrent psbo -patient just DC home on Friday for admission for SBO.  He came back yesterday with recurrent symptoms.  SBO protocol in place.   -he is already passing some flatus, but my concern with this quick of a readmission is that he may have a high grade psbo.  Will follow and see how he does.    2. DVT prophylaxis -lovenox/SCDs  LOS: 1 day    Eleny Cortez E 09/17/2015, 9:23 AM Pager: [phone]

## 2015-09-17 NOTE — Progress Notes (Signed)
Utilization review completed. Tanis Hensarling, RN, BSN. 

## 2015-09-18 LAB — BASIC METABOLIC PANEL
ANION GAP: 8 (ref 5–15)
BUN: 9 mg/dL (ref 6–20)
CHLORIDE: 103 mmol/L (ref 101–111)
CO2: 26 mmol/L (ref 22–32)
CREATININE: 1.02 mg/dL (ref 0.61–1.24)
Calcium: 8.1 mg/dL — ABNORMAL LOW (ref 8.9–10.3)
GFR calc non Af Amer: >60 mL/min (ref >60)
Glucose, Bld: 82 mg/dL (ref 65–99)
POTASSIUM: 3.6 mmol/L (ref 3.5–5.1)
SODIUM: 137 mmol/L (ref 135–145)

## 2015-09-18 LAB — CBC
HCT: 36.4 % — ABNORMAL LOW (ref 39.0–52.0)
HEMOGLOBIN: 12.1 g/dL — AB (ref 13.0–17.0)
MCH: 30.6 pg (ref 26.0–34.0)
MCHC: 33.2 g/dL (ref 30.0–36.0)
MCV: 92.2 fL (ref 78.0–100.0)
Platelets: 177 10*3/uL (ref 150–400)
RBC: 3.95 MIL/uL — AB (ref 4.22–5.81)
RDW: 12.8 % (ref 11.5–15.5)
WBC: 5.3 10*3/uL (ref 4.0–10.5)

## 2015-09-18 MED ORDER — SODIUM CHLORIDE 0.9 % IV SOLN
1000.0000 mL | INTRAVENOUS | Status: DC
  Administered 2015-09-18: 1000 mL via INTRAVENOUS

## 2015-09-18 MED ORDER — SODIUM CHLORIDE 0.9 % IV SOLN
25.0000 mg | Freq: Four times a day (QID) | INTRAVENOUS | Status: DC | PRN
  Filled 2015-09-18: qty 1

## 2015-09-18 NOTE — Plan of Care (Signed)
Problem: Phase I Progression Outcomes Goal: OOB as tolerated unless otherwise ordered Outcome: Progressing Pt was up ambulating in hall

## 2015-09-18 NOTE — Progress Notes (Signed)
[facility]  [address] [place], [place] [address] Phone: [phone] FAX: [phone]   ARCADIO COPE [id number] Jul 11, 1941   Problem List:   Principal Problem:   SBO (small bowel obstruction) [facility]) Active Problems:   Recurrent ventral incisional hernia s/p lap re-repair w mesh May 2016   Hypertension   Cerebral artery disease   Hyperlipidemia      *   04/23/2015  PATIENT: Matthew Hodges 75 y.o. male  Patient Care Team: Darcus Austin, MD as PCP - General (Family Medicine) Druscilla Brownie, MD (Dermatology) Carolan Clines, MD as Attending Physician (Urology)  PRE-OPERATIVE DIAGNOSIS: Incisonal Ventral Wall Abdominal Hernia  POST-OPERATIVE DIAGNOSIS: Incisonal Ventral Wall Abdominal Hernia AND ADHESIONS  PROCEDURE: LAPAROSCOPIC VENTRAL [place] with mesh LAPAROSCOPIC LYSIS OF ADHESIONS X 2 HOURS (66% of case)  OR FINDINGS:   Swiss cheese hernias noted an central region. Prior onlay polypropylene mesh with holes within the mesh. Those are the most obvious thinned out areas. 12x8 cm swiss cheese region. Moderate diastases/hernation in central abdomen 21 x 17 cm region. Prior left lower quadrant colostomy hernia repair intact with Parietex mesh.  Very dense adhesions of small bowel & colon to mesh.Greater omentum stuck in left upper quadrant.    Type of repair - Laparoscopic underlay repair  Name of mesh - Bard Ventralight dual sided (polypropylene / Seprafilm)  Size of mesh - Length 33 cm, Width 28 cm  Mesh overlap - 5-7 cm  Placement of mesh - Intraperitoneal underlay repair  SURGEON: Surgeon(s): Michael Boston, MD        Assessment  Recurrent PSBO with transition to anterior abdominal wall  Plan:  -give clamping trial & not just d/c NGT right away even if looks good (that is what happened last time & here we are....  With his bloatedness and hiccuping after trying  clear liquids, I'm concerned he will fail.  However he claims he is having some flatus and bowel movements.  So we will give this a try.   -I suspect pt will need lap LOA to fix this with prob SB fecalization & early bounce back w SBO but we will see.  Higher risk of SB injury w ## surgeries though.  We will see...  The anatomy & physiology of the digestive tract was discussed.  The pathophysiology of intestinal obstruction was discussed.  Natural history risks without surgery was discussed.   I feel the patient has failed non-operative therapies.  The risks of no intervention will lead to serious problems such as necrosis, perforation, dehydration, etc. that outweigh the operative risks; therefore, I recommended abdominal exploration to diagnose & treat the source of the problem.  Minimally Invasive & open techniques were discussed.   I expressed a good likelihood that surgery will treat the problem.  Risks such as bleeding, infection, abscess, leak, reoperation, bowel resection, possible ostomy, hernia, injury to other organs, need for repair of tissues / organs, need for further treatment, heart attack, death, and other risks were discussed.   I noted a good likelihood this will help address the problem.  Goals of post-operative recovery were discussed as well.  We will work to minimize complications. Questions were answered.  The patient expresses understanding & wishes to hold off on surgery.   -IVF -HTN control -VTE prophylaxis- SCDs, etc -mobilize as tolerated to help recovery  [name], M.D., F.A.C.S. Gastrointestinal and Minimally Invasive Surgery [facility] [address] [place],  [address] [phone] Main / Paging   09/18/2015  Subjective:  Sore nose Had flatus.  Feels full after drinking all of the liquid tray this morning  Objective:  Vital signs:  Filed Vitals:   09/17/15 1430 09/17/15 2200 09/18/15 0000 09/18/15  0634  BP: 167/72 159/67  145/59  Pulse: 72 71  69  Temp: 97.9 F (36.6 C) 98.1 F (36.7 C)  98.1 F (36.7 C)  TempSrc: Oral Oral  Oral  Resp: 20 18  18  Height:   5' 8" (1.727 m)   Weight:   88.02 kg (194 lb 0.8 oz)   SpO2: 97% 96%  95%    Last BM Date: 09/17/15  Intake/Output   Yesterday:  10/18 0701 - 10/19 0700 In: 2714.6 [P.O.:100; I.V.:2564.6] Out: 1600 [Urine:1000; Emesis/NG output:600] This shift:  Total I/O In: 2714.6 [P.O.:100; I.V.:2564.6; Other:50] Out: 850 [Urine:850]  Bowel function:  Flatus: y  BM: y  Drain: thick brown  Physical Exam:  General: Pt awake/alert/oriented x4 in no acute distress Eyes: PERRL, normal EOM.  Sclera clear.  No icterus Neuro: CN II-XII intact w/o focal sensory/motor deficits. Lymph: No head/neck/groin lymphadenopathy Psych:  No delerium/psychosis/paranoia HENT: Normocephalic, Mucus membranes moist.  No thrush.  Some hiccuping Neck: Supple, No tracheal deviation Chest: No chest wall pain w good excursion CV:  Pulses intact.  Regular rhythm MS: Normal AROM mjr joints.  No obvious deformity Abdomen: Soft.  Moderate distended. Min tender.  No evidence of peritonitis.  No incarcerated hernias. Ext:  SCDs BLE.  No mjr edema.  No cyanosis Skin: No petechiae / purpura  Results:   Labs: Results for orders placed or performed during the hospital encounter of 09/15/15 (from the past 48 hour(s))  Basic metabolic panel     Status: Abnormal   Collection Time: 09/17/15  3:00 AM  Result Value Ref Range   Sodium 140 135 - 145 mmol/L   Potassium 3.8 3.5 - 5.1 mmol/L   Chloride 105 101 - 111 mmol/L   CO2 29 22 - 32 mmol/L   Glucose, Bld 84 65 - 99 mg/dL   BUN 11 6 - 20 mg/dL   Creatinine, Ser 1.03 0.61 - 1.24 mg/dL   Calcium 8.2 (L) 8.9 - 10.3 mg/dL   GFR calc non Af Amer >60 >60 mL/min   GFR calc Af Amer >60 >60 mL/min    Comment: (NOTE) The eGFR has been calculated using the CKD EPI equation. This calculation has not been  validated in all clinical situations. eGFR's persistently <60 mL/min signify possible Chronic Kidney Disease.    Anion gap 6 5 - 15  Magnesium     Status: None   Collection Time: 09/17/15  3:00 AM  Result Value Ref Range   Magnesium 1.7 1.7 - 2.4 mg/dL  Phosphorus     Status: None   Collection Time: 09/17/15  3:00 AM  Result Value Ref Range   Phosphorus 3.3 2.5 - 4.6 mg/dL  CBC     Status: Abnormal   Collection Time: 09/17/15  3:00 AM  Result Value Ref Range   WBC 5.0 4.0 - 10.5 K/uL   RBC 3.86 (L) 4.22 - 5.81 MIL/uL   Hemoglobin 12.0 (L) 13.0 - 17.0 g/dL   HCT 36.3 (L) 39.0 - 52.0 %   MCV 94.0 78.0 - 100.0 fL   MCH 31.1 26.0 - 34.0 pg   MCHC 33.1 30.0 - 36.0 g/dL   RDW 13.4 11.5 - 15.5 %     Platelets 181 150 - 400 K/uL  Glucose, capillary     Status: None   Collection Time: 09/17/15  8:04 PM  Result Value Ref Range   Glucose-Capillary 94 65 - 99 mg/dL  CBC     Status: Abnormal   Collection Time: 09/18/15  2:50 AM  Result Value Ref Range   WBC 5.3 4.0 - 10.5 K/uL   RBC 3.95 (L) 4.22 - 5.81 MIL/uL   Hemoglobin 12.1 (L) 13.0 - 17.0 g/dL   HCT 36.4 (L) 39.0 - 52.0 %   MCV 92.2 78.0 - 100.0 fL   MCH 30.6 26.0 - 34.0 pg   MCHC 33.2 30.0 - 36.0 g/dL   RDW 12.8 11.5 - 15.5 %   Platelets 177 150 - 400 K/uL  Basic metabolic panel     Status: Abnormal   Collection Time: 09/18/15  2:50 AM  Result Value Ref Range   Sodium 137 135 - 145 mmol/L   Potassium 3.6 3.5 - 5.1 mmol/L   Chloride 103 101 - 111 mmol/L   CO2 26 22 - 32 mmol/L   Glucose, Bld 82 65 - 99 mg/dL   BUN 9 6 - 20 mg/dL   Creatinine, Ser 1.02 0.61 - 1.24 mg/dL   Calcium 8.1 (L) 8.9 - 10.3 mg/dL   GFR calc non Af Amer >60 >60 mL/min   GFR calc Af Amer >60 >60 mL/min    Comment: (NOTE) The eGFR has been calculated using the CKD EPI equation. This calculation has not been validated in all clinical situations. eGFR's persistently <60 mL/min signify possible Chronic Kidney Disease.    Anion gap 8 5 - 15     Imaging / Studies: Dg Abd Portable 1v-small Bowel Obstruction Protocol-initial, 8 Hr Delay  09/17/2015  CLINICAL DATA:  Small bowel obstruction.  8.5 hour delay evaluation. EXAM: PORTABLE ABDOMEN - 1 VIEW COMPARISON:  Abdominal radiograph from earlier today. FINDINGS: Enteric tube terminates in the distal stomach. There are mildly dilated small bowel loops in the central abdomen, which appear decreased from the CT study from one day prior. Oral contrast does traverse to the distal rectum, with retained oral contrast in the distal small bowel and throughout the colon. No evidence of pneumatosis or pneumoperitoneum. Degenerative changes throughout the lumbar spine. IMPRESSION: Interval progression of oral contrast to the distal rectum. Mildly dilated central abdominal small bowel loops, decreased compared to the CT study from one day prior. Findings are in keeping with resolving small bowel obstruction. Electronically Signed   By: Jason A Poff M.D.   On: 09/17/2015 18:35   Dg Abd Portable 1v  09/17/2015  CLINICAL DATA:  NG tube placement . EXAM: PORTABLE ABDOMEN - 1 VIEW COMPARISON:  CT 09/16/2015.  Abdominal series 1014 2016 . FINDINGS: NG tube noted with tip below left hemidiaphragm. Side hole is at the gastroesophageal junction. Slight advancement NG tube should be considered. No bowel distention. Contrast in the colon from prior CT. No free air. Surgical staples are noted over the abdomen. IMPRESSION: 1. NG tube noted with tip in the upper stomach, side hole at the GE junction. Slight advancement should be considered. 2. No acute intra-abdominal abnormality identified. Oral contrast in the colon from prior CT. No bowel distention . Electronically Signed   By: Thomas  Register   On: 09/17/2015 08:27    Medications / Allergies: per chart  Antibiotics: Anti-infectives    None        Note: Portions of this report may have been transcribed   using voice recognition software. Every effort was  made to ensure accuracy; however, inadvertent computerized transcription errors may be present.   Any transcriptional errors that result from this process are unintentional.      C. , M.D., F.A.C.S. Gastrointestinal and Minimally Invasive Surgery [facility] [address] [place], [place] [address] [phone] Main / Paging   09/18/2015  CARE TEAM:  PCP: GATES,DONNA RUTH, MD  Outpatient Care Team: Patient Care Team: Donna Gates, MD as PCP - General (Family Medicine) Frederick Lupton, MD (Dermatology) Sigmund Tannenbaum, MD as Attending Physician (Urology)  Inpatient Treatment Team: Treatment Team: Attending Provider: Md Ccs, MD; Consulting Physician: Md Ccs, MD; Technician: Sonya T Gravely, NT; Registered Nurse: Nancy B Irish, RN; Consulting Physician:  , MD; Technician: Deborah J Brown, NT; Registered Nurse: Lourdes B Abiera, RN; Registered Nurse: Lauren R Breedlove, RN  

## 2015-09-19 MED ORDER — ASPIRIN EC 81 MG PO TBEC
81.0000 mg | DELAYED_RELEASE_TABLET | Freq: Every morning | ORAL | Status: DC
  Administered 2015-09-19 – 2015-09-20 (×2): 81 mg via ORAL
  Filled 2015-09-19 (×2): qty 1

## 2015-09-19 MED ORDER — SODIUM CHLORIDE 0.9 % IJ SOLN: 3.0000 mL | INTRAMUSCULAR | Status: DC | PRN

## 2015-09-19 MED ORDER — ZOLPIDEM TARTRATE 5 MG PO TABS
5.0000 mg | ORAL_TABLET | Freq: Every evening | ORAL | Status: DC | PRN
  Administered 2015-09-19: 5 mg via ORAL
  Filled 2015-09-19: qty 1

## 2015-09-19 MED ORDER — SODIUM CHLORIDE 0.9 % IV SOLN: 250.0000 mL | INTRAVENOUS | Status: DC | PRN

## 2015-09-19 MED ORDER — MEGA MULTIVITAMIN FOR MEN PO TABS: ORAL_TABLET | Freq: Every morning | ORAL | Status: DC

## 2015-09-19 MED ORDER — LACTATED RINGERS IV BOLUS (SEPSIS): 1000.0000 mL | Freq: Three times a day (TID) | INTRAVENOUS | Status: DC | PRN

## 2015-09-19 MED ORDER — TAB-A-VITE/IRON PO TABS
1.0000 | ORAL_TABLET | Freq: Every day | ORAL | Status: DC
  Administered 2015-09-19: 1 via ORAL
  Filled 2015-09-19 (×2): qty 1

## 2015-09-19 MED ORDER — SODIUM CHLORIDE 0.9 % IJ SOLN
3.0000 mL | Freq: Two times a day (BID) | INTRAMUSCULAR | Status: DC
  Administered 2015-09-19: 3 mL via INTRAVENOUS

## 2015-09-19 MED ORDER — ATORVASTATIN CALCIUM 20 MG PO TABS
20.0000 mg | ORAL_TABLET | Freq: Every day | ORAL | Status: DC
  Administered 2015-09-19: 20 mg via ORAL
  Filled 2015-09-19: qty 1

## 2015-09-19 MED ORDER — MIRABEGRON ER 25 MG PO TB24
25.0000 mg | ORAL_TABLET | Freq: Every morning | ORAL | Status: DC
  Administered 2015-09-19: 25 mg via ORAL
  Filled 2015-09-19 (×2): qty 1

## 2015-09-19 MED ORDER — PANTOPRAZOLE SODIUM 40 MG PO TBEC
40.0000 mg | DELAYED_RELEASE_TABLET | Freq: Every day | ORAL | Status: DC
  Administered 2015-09-19 – 2015-09-20 (×2): 40 mg via ORAL
  Filled 2015-09-19 (×2): qty 1

## 2015-09-19 MED ORDER — VITAMIN D 1000 UNITS PO TABS
1000.0000 [IU] | ORAL_TABLET | Freq: Every morning | ORAL | Status: DC
  Administered 2015-09-19 – 2015-09-20 (×2): 1000 [IU] via ORAL
  Filled 2015-09-19 (×2): qty 1

## 2015-09-19 NOTE — Care Management Important Message (Signed)
Important Message  Patient Details  Name: Matthew Hodges MRN: [id number] Date of Birth: 07/16/1941   Medicare Important Message Given:  Yes-second notification given    Delorse Lek 09/19/2015, 12:23 PM

## 2015-09-19 NOTE — Progress Notes (Addendum)
[facility]  [address] [place], [place] [address] Phone: [phone] FAX: [phone]   SAJJAD HONEA [id number] 26-Nov-1941   Problem List:   Principal Problem:   SBO (small bowel obstruction) [facility]) Active Problems:   Recurrent ventral incisional hernia s/p lap re-repair w mesh May 2016   Hypertension   Cerebral artery disease   Hyperlipidemia      *   04/23/2015  PATIENT: Matthew Hodges 74 y.o. male  Patient Care Team: Darcus Austin, MD as PCP - General (Family Medicine) Druscilla Brownie, MD (Dermatology) Carolan Clines, MD as Attending Physician (Urology)  PRE-OPERATIVE DIAGNOSIS: Incisonal Ventral Wall Abdominal Hernia  POST-OPERATIVE DIAGNOSIS: Incisonal Ventral Wall Abdominal Hernia AND ADHESIONS  PROCEDURE: LAPAROSCOPIC VENTRAL [place] with mesh LAPAROSCOPIC LYSIS OF ADHESIONS X 2 HOURS (66% of case)  OR FINDINGS:   Swiss cheese hernias noted an central region. Prior onlay polypropylene mesh with holes within the mesh. Those are the most obvious thinned out areas. 12x8 cm swiss cheese region. Moderate diastases/hernation in central abdomen 21 x 17 cm region. Prior left lower quadrant colostomy hernia repair intact with Parietex mesh.  Very dense adhesions of small bowel & colon to mesh.Greater omentum stuck in left upper quadrant.    Type of repair - Laparoscopic underlay repair  Name of mesh - Bard Ventralight dual sided (polypropylene / Seprafilm)  Size of mesh - Length 33 cm, Width 28 cm  Mesh overlap - 5-7 cm  Placement of mesh - Intraperitoneal underlay repair  SURGEON: Surgeon(s): Michael Boston, MD        Assessment  Recurrent PSBO with transition to anterior abdominal wall - better  Plan:  Tolerated nasogastric tube clamping trial.  Abdomen markedly softer with good flatus and bowel movements.  We will try and advance him and cross our fingers  this will continue to resolve nonoperatively.  -I suspect pt will need lap LOA to fix this with prob SB fecalization & early bounce back w SBO but we will see.  Higher risk of SB injury w ## surgeries though.  We will see...  The anatomy & physiology of the digestive tract was discussed.  The pathophysiology of intestinal obstruction was discussed.  Natural history risks without surgery was discussed.   I feel the patient has failed non-operative therapies.  The risks of no intervention will lead to serious problems such as necrosis, perforation, dehydration, etc. that outweigh the operative risks; therefore, I recommended abdominal exploration to diagnose & treat the source of the problem.  Minimally Invasive & open techniques were discussed.   I expressed a good likelihood that surgery will treat the problem.  Risks such as bleeding, infection, abscess, leak, reoperation, bowel resection, possible ostomy, hernia, injury to other organs, need for repair of tissues / organs, need for further treatment, heart attack, death, and other risks were discussed.   I noted a good likelihood this will help address the problem.  Goals of post-operative recovery were discussed as well.  We will work to minimize complications. Questions were answered.  The patient expresses understanding & wishes to hold off on surgery.   -IVF -HTN control -VTE prophylaxis- SCDs, etc -mobilize as tolerated to help recovery  D/C patient from hospital when patient meets criteria (anticipate in 1-2 day(s)):  Tolerating oral intake well Ambulating well Adequate pain control without IV medications Urinating  Having flatus Disposition planning in place   [name], M.D., F.A.C.S. Gastrointestinal and Minimally Invasive Surgery  [facility] [phone] [place], [place] [address] [phone] Main / Paging   09/19/2015  Subjective:  Sore nose Feeling much better.  Had much  more flatus and bowel movements.  Tolerated clears with NG tube clamping trials.  Virtually no residuals.  Abdomen feels softer.  No more bloating.  No hiccups.   Objective:  Vital signs:  Filed Vitals:   09/18/15 0634 09/18/15 1436 09/18/15 2211 09/19/15 0519  BP: 145/59 162/65 167/58 143/60  Pulse: 69 75 71 63  Temp: 98.1 F (36.7 C) 97.6 F (36.4 C) 98.4 F (36.9 C) 98.6 F (37 C)  TempSrc: Oral Oral Oral Oral  Resp: _0 Height:      Weight:      SpO2: 95% 97% 98% 96%    Last BM Date: 09/18/15  Intake/Output   Yesterday:  10/19 0701 - 10/20 0700 In: 1377.5 [P.O.:470; I.V.:887.5] Out: 9458 [Urine:1600; Emesis/NG output:4] This shift:  Total I/O In: 770 [P.O.:220; I.V.:550] Out: 15 [Urine:1100; Emesis/NG output:2]  Bowel function:  Flatus: y  BM: y  Drain: minimal  Physical Exam:  General: Pt awake/alert/oriented x4 in no acute distress Eyes: PERRL, normal EOM.  Sclera clear.  No icterus Neuro: CN II-XII intact w/o focal sensory/motor deficits. Lymph: No head/neck/groin lymphadenopathy Psych:  No delerium/psychosis/paranoia HENT: Normocephalic, Mucus membranes moist.  No thrush.  Some hiccuping Neck: Supple, No tracheal deviation Chest: No chest wall pain w good excursion CV:  Pulses intact.  Regular rhythm MS: Normal AROM mjr joints.  No obvious deformity Abdomen: Much softer.  Obese  Minimally distended - improved.  No evidence of peritonitis.  No incarcerated hernias. Ext:  SCDs BLE.  No mjr edema.  No cyanosis Skin: No petechiae / purpura  Results:   Labs: Results for orders placed or performed during the hospital encounter of 09/15/15 (from the past 48 hour(s))  Glucose, capillary     Status: None   Collection Time: 09/17/15  8:04 PM  Result Value Ref Range   Glucose-Capillary 94 65 - 99 mg/dL  CBC     Status: Abnormal   Collection Time: 09/18/15  2:50 AM  Result Value Ref Range   WBC 5.3 4.0 - 10.5 K/uL   RBC 3.95 (L) 4.22 - 5.81  MIL/uL   Hemoglobin 12.1 (L) 13.0 - 17.0 g/dL   HCT 36.4 (L) 39.0 - 52.0 %   MCV 92.2 78.0 - 100.0 fL   MCH 30.6 26.0 - 34.0 pg   MCHC 33.2 30.0 - 36.0 g/dL   RDW 12.8 11.5 - 15.5 %   Platelets 177 150 - 400 K/uL  Basic metabolic panel     Status: Abnormal   Collection Time: 09/18/15  2:50 AM  Result Value Ref Range   Sodium 137 135 - 145 mmol/L   Potassium 3.6 3.5 - 5.1 mmol/L   Chloride 103 101 - 111 mmol/L   CO2 26 22 - 32 mmol/L   Glucose, Bld 82 65 - 99 mg/dL   BUN 9 6 - 20 mg/dL   Creatinine, Ser 1.02 0.61 - 1.24 mg/dL   Calcium 8.1 (L) 8.9 - 10.3 mg/dL   GFR calc non Af Amer >60 >60 mL/min   GFR calc Af Amer >60 >60 mL/min    Comment: (NOTE) The eGFR has been calculated using the CKD EPI equation. This calculation has not been validated in all clinical situations. eGFR's persistently <60 mL/min signify possible Chronic Kidney Disease.  Anion gap 8 5 - 15    Imaging / Studies: Dg Abd Portable 1v-small Bowel Obstruction Protocol-initial, 8 Hr Delay  09/17/2015  CLINICAL DATA:  Small bowel obstruction.  8.5 hour delay evaluation. EXAM: PORTABLE ABDOMEN - 1 VIEW COMPARISON:  Abdominal radiograph from earlier today. FINDINGS: Enteric tube terminates in the distal stomach. There are mildly dilated small bowel loops in the central abdomen, which appear decreased from the CT study from one day prior. Oral contrast does traverse to the distal rectum, with retained oral contrast in the distal small bowel and throughout the colon. No evidence of pneumatosis or pneumoperitoneum. Degenerative changes throughout the lumbar spine. IMPRESSION: Interval progression of oral contrast to the distal rectum. Mildly dilated central abdominal small bowel loops, decreased compared to the CT study from one day prior. Findings are in keeping with resolving small bowel obstruction. Electronically Signed   By: Ilona Sorrel M.D.   On: 09/17/2015 18:35   Dg Abd Portable 1v  09/17/2015  CLINICAL DATA:   NG tube placement . EXAM: PORTABLE ABDOMEN - 1 VIEW COMPARISON:  CT 09/16/2015.  Abdominal series 1014 2016 . FINDINGS: NG tube noted with tip below left hemidiaphragm. Side hole is at the gastroesophageal junction. Slight advancement NG tube should be considered. No bowel distention. Contrast in the colon from prior CT. No free air. Surgical staples are noted over the abdomen. IMPRESSION: 1. NG tube noted with tip in the upper stomach, side hole at the GE junction. Slight advancement should be considered. 2. No acute intra-abdominal abnormality identified. Oral contrast in the colon from prior CT. No bowel distention . Electronically Signed   By: Marcello Moores  Register   On: 09/17/2015 08:27    Medications / Allergies: per chart  Antibiotics: Anti-infectives    None        Note: Portions of this report may have been transcribed using voice recognition software. Every effort was made to ensure accuracy; however, inadvertent computerized transcription errors may be present.   Any transcriptional errors that result from this process are unintentional.     [name], M.D., F.A.C.S. Gastrointestinal and Minimally Invasive Surgery [facility] [address], [place], [place] [address] [phone] Main / Paging   09/19/2015  CARE TEAM:  PCP: Marjorie Smolder, MD  Outpatient Care Team: Patient Care Team: Darcus Austin, MD as PCP - General (Family Medicine) Druscilla Brownie, MD (Dermatology) Carolan Clines, MD as Attending Physician (Urology)  Inpatient Treatment Team: Treatment Team: Attending Provider: Nolon Nations, MD; Consulting Physician: Md Edison Pace, MD; Technician: Roger Shelter, NT; Consulting Physician: Michael Boston, MD; Technician: Ronalee Red, NT; Registered Nurse: Kennith Center, RN; Registered Nurse: Roetta Sessions, RN; Technician: Si Raider, NT

## 2015-09-20 NOTE — Progress Notes (Signed)
Pt tolerated breakfast well and states he has no nausea or pain. I reviewed the discharge teaching with the pt and went over the patient education. Pt verbally stated he understood.

## 2015-09-20 NOTE — Discharge Summary (Signed)
Physician Discharge Summary  Patient ID: Matthew Hodges MRN: [id number] DOB/AGE: 74-Sep-1942 74 y.o.  Admit date: 09/15/2015 Discharge date: 09/20/2015  Patient Care Team: Darcus Austin, MD as PCP - General (Family Medicine) Druscilla Brownie, MD (Dermatology) Carolan Clines, MD as Attending Physician (Urology)  Admission Diagnoses: Principal Problem:   SBO (small bowel obstruction) [facility]) Active Problems:   Recurrent ventral incisional hernia s/p lap re-repair w mesh May 2016   Hypertension   Cerebral artery disease   Hyperlipidemia   Discharge Diagnoses:  Principal Problem:   SBO (small bowel obstruction) ([place]) Active Problems:   Recurrent ventral incisional hernia s/p lap re-repair w mesh May 2016   Hypertension   Cerebral artery disease   Hyperlipidemia   POST-OPERATIVE DIAGNOSIS:   * No surgery found *  SURGERY:  * No surgery found *    SURGEON:      Consults: None  Hospital Course:   The patient has history of numerous surgeries including colectomy colostomy hernia repairs and removal of stitch abscesses.  Came in with recurrent bowel obstruction.  Nasogastric tube placed.  Given IV fluids.  Felt better.  Tolerated clamping trial.  Slowly advanced on diet.    By the time of discharge, the patient was walking well the hallways, eating solid food, having flatus & BMs.  Pain was gone.  Based on meeting discharge criteria and continuing to recover, I felt it was safe for the patient to be discharged from the hospital to further recover with close followup. Postoperative recommendations were discussed in detail.  They are written as well.   Significant Diagnostic Studies:  Results for orders placed or performed during the hospital encounter of 09/15/15 (from the past 72 hour(s))  Glucose, capillary     Status: None   Collection Time: 09/17/15  8:04 PM  Result Value Ref Range   Glucose-Capillary 94 65 - 99 mg/dL  CBC     Status: Abnormal   Collection  Time: 09/18/15  2:50 AM  Result Value Ref Range   WBC 5.3 4.0 - 10.5 K/uL   RBC 3.95 (L) 4.22 - 5.81 MIL/uL   Hemoglobin 12.1 (L) 13.0 - 17.0 g/dL   HCT 36.4 (L) 39.0 - 52.0 %   MCV 92.2 78.0 - 100.0 fL   MCH 30.6 26.0 - 34.0 pg   MCHC 33.2 30.0 - 36.0 g/dL   RDW 12.8 11.5 - 15.5 %   Platelets 177 150 - 400 K/uL  Basic metabolic panel     Status: Abnormal   Collection Time: 09/18/15  2:50 AM  Result Value Ref Range   Sodium 137 135 - 145 mmol/L   Potassium 3.6 3.5 - 5.1 mmol/L   Chloride 103 101 - 111 mmol/L   CO2 26 22 - 32 mmol/L   Glucose, Bld 82 65 - 99 mg/dL   BUN 9 6 - 20 mg/dL   Creatinine, Ser 1.02 0.61 - 1.24 mg/dL   Calcium 8.1 (L) 8.9 - 10.3 mg/dL   GFR calc non Af Amer >60 >60 mL/min   GFR calc Af Amer >60 >60 mL/min    Comment: (NOTE) The eGFR has been calculated using the CKD EPI equation. This calculation has not been validated in all clinical situations. eGFR's persistently <60 mL/min signify possible Chronic Kidney Disease.    Anion gap 8 5 - 15    Dg Abd Portable 1v-small Bowel Obstruction Protocol-initial, 8 Hr Delay  09/17/2015  CLINICAL DATA:  Small bowel obstruction.  8.5 hour delay evaluation.  EXAM: PORTABLE ABDOMEN - 1 VIEW COMPARISON:  Abdominal radiograph from earlier today. FINDINGS: Enteric tube terminates in the distal stomach. There are mildly dilated small bowel loops in the central abdomen, which appear decreased from the CT study from one day prior. Oral contrast does traverse to the distal rectum, with retained oral contrast in the distal small bowel and throughout the colon. No evidence of pneumatosis or pneumoperitoneum. Degenerative changes throughout the lumbar spine. IMPRESSION: Interval progression of oral contrast to the distal rectum. Mildly dilated central abdominal small bowel loops, decreased compared to the CT study from one day prior. Findings are in keeping with resolving small bowel obstruction. Electronically Signed   By: Jason A  Poff M.D.   On: 09/17/2015 18:35   Dg Abd Portable 1v  09/17/2015  CLINICAL DATA:  NG tube placement . EXAM: PORTABLE ABDOMEN - 1 VIEW COMPARISON:  CT 09/16/2015.  Abdominal series 1014 2016 . FINDINGS: NG tube noted with tip below left hemidiaphragm. Side hole is at the gastroesophageal junction. Slight advancement NG tube should be considered. No bowel distention. Contrast in the colon from prior CT. No free air. Surgical staples are noted over the abdomen. IMPRESSION: 1. NG tube noted with tip in the upper stomach, side hole at the GE junction. Slight advancement should be considered. 2. No acute intra-abdominal abnormality identified. Oral contrast in the colon from prior CT. No bowel distention . Electronically Signed   By: Thomas  Register   On: 09/17/2015 08:27    Discharge Exam: Blood pressure 135/73, pulse 87, temperature 98.2 F (36.8 C), temperature source Oral, resp. rate 19, height 5' 8" (1.727 m), weight 88.02 kg (194 lb 0.8 oz), SpO2 94 %.  General: Pt awake/alert/oriented x4 in no major acute distress Eyes: PERRL, normal EOM. Sclera nonicteric Neuro: CN II-XII intact w/o focal sensory/motor deficits. Lymph: No head/neck/groin lymphadenopathy Psych:  No delerium/psychosis/paranoia HENT: Normocephalic, Mucus membranes moist.  No thrush Neck: Supple, No tracheal deviation Chest: No pain.  Good respiratory excursion. CV:  Pulses intact.  Regular rhythm MS: Normal AROM mjr joints.  No obvious deformity Abdomen: Soft, obese.  Nondistended.  Nontender.  No incarcerated hernias. Ext:  SCDs BLE.  No significant edema.  No cyanosis Skin: No petechiae / purpura  Discharged Condition: good   Past Medical History  Diagnosis Date  . Cerebral artery disease   . Diverticular disease   . Hyperlipidemia   . Hypertension     hx. of . no meds since 2004  . Prostate cancer (HCC)     treated with radiation  . Basal cell carcinoma     "sqattered"  . Squamous carcinoma (HCC)      "sqattered"  . Heart murmur     "after surgeries before"  . Stroke (HCC) 1979    no deficits    Past Surgical History  Procedure Laterality Date  . Partial colectomy  01/1999    Hartmans procedure with colostomy  . Small bowel repair  03/1999    postop abscesses with fistula.  Abscesses drained.  Fistula repaired.  . Colostomy takedown  08/1999  . Incisional hernia repair  2001    open w mesh  . Laparoscopic incisional / umbilical / ventral hernia repair  2008    with mesh.  LLQ old colostomy site  . Remove suture under anesthesia  2011    stitch abscesses LLQ & midline  . Ventral hernia repair N/A 04/23/2015    Procedure: LAPAROSCOPIC VENTRAL WALL HERNIA REPAIR AND LAP LYSIS   OF ADHESIONS;  Surgeon:  , MD;  Location: [facility];  Service: General;  Laterality: N/A;  With MESH  . Lysis of adhesion N/A 04/23/2015    Procedure: LYSIS OF ADHESION;  Surgeon:  , MD;  Location: [facility];  Service: General;  Laterality: N/A;  . Colon surgery    . Hernia repair      "lots; had diverticulitis years ago"  . Prostate biopsy  ~ 2002    Social History   Social History  . Marital Status: Married    Spouse Name: N/A  . Number of Children: N/A  . Years of Education: N/A   Occupational History  . Not on file.   Social History Main Topics  . Smoking status: Former Smoker -- 2.00 packs/day for 47 years    Types: Cigarettes    Quit date: 11/30/2004  . Smokeless tobacco: Never Used  . Alcohol Use: Yes     Comment: recovering alcoholic sober since 2004  . Drug Use: No  . Sexual Activity: Not Currently   Other Topics Concern  . Not on file   Social History Narrative    No family history on file.  Current Facility-Administered Medications  Medication Dose Route Frequency Provider Last Rate Last Dose  . 0.9 %  sodium chloride infusion  250 mL Intravenous PRN  , MD      . acetaminophen (TYLENOL) suppository 650 mg  650 mg Rectal Q6H PRN  , MD       . alum & mag hydroxide-simeth (MAALOX/MYLANTA) 200-200-20 MG/5ML suspension 30 mL  30 mL Oral Q6H PRN  , MD      . aspirin EC tablet 81 mg  81 mg Oral q morning - 10a  , MD   81 mg at 09/19/15 1000  . atorvastatin (LIPITOR) tablet 20 mg  20 mg Oral QHS  , MD   20 mg at 09/19/15 2113  . bisacodyl (DULCOLAX) suppository 10 mg  10 mg Rectal Daily  , MD   10 mg at 09/17/15 0859  . chlorproMAZINE (THORAZINE) 25 mg in sodium chloride 0.9 % 25 mL IVPB  25 mg Intravenous Q6H PRN  , MD      . cholecalciferol (VITAMIN D) tablet 1,000 Units  1,000 Units Oral q morning - 10a  , MD   1,000 Units at 09/19/15 1000  . diphenhydrAMINE (BENADRYL) injection 12.5-25 mg  12.5-25 mg Intravenous Q6H PRN  , MD   25 mg at 09/18/15 2121  . enoxaparin (LOVENOX) injection 40 mg  40 mg Subcutaneous Q24H Luke Aaron Kinsinger, MD   40 mg at 09/18/15 1115  . HYDROmorphone (DILAUDID) injection 0.5-2 mg  0.5-2 mg Intravenous Q2H PRN  , MD      . lactated ringers bolus 1,000 mL  1,000 mL Intravenous Q8H PRN  , MD      . lip balm (BLISTEX) ointment   Topical PRN  , MD      . magic mouthwash  15 mL Oral QID PRN  , MD      . menthol-cetylpyridinium (CEPACOL) lozenge 3 mg  1 lozenge Oral PRN  , MD      . metoCLOPramide (REGLAN) injection 5-10 mg  5-10 mg Intravenous Q6H PRN  , MD      . metoprolol (LOPRESSOR) injection 5 mg  5 mg Intravenous Q6H PRN  , MD      . mirabegron ER (MYRBETRIQ) tablet 25 mg  25 mg Oral q morning - 10a  ,   MD   25 mg at 09/19/15 1002  . multivitamins with iron tablet 1 tablet  1 tablet Oral Daily Michael Boston, MD   1 tablet at 09/19/15 1000  . ondansetron (ZOFRAN) injection 4 mg  4 mg Intravenous Q6H PRN Michael Boston, MD       Or  . ondansetron (ZOFRAN) 8 mg in sodium chloride 0.9 % 50 mL IVPB  8 mg Intravenous Q6H PRN Michael Boston, MD      .  ondansetron (ZOFRAN-ODT) disintegrating tablet 4-8 mg  4-8 mg Oral Q6H PRN Michael Boston, MD      . pantoprazole (PROTONIX) EC tablet 40 mg  40 mg Oral Daily Georganna Skeans, MD   40 mg at 09/19/15 2114  . phenol (CHLORASEPTIC) mouth spray 2 spray  2 spray Mouth/Throat PRN Michael Boston, MD      . promethazine (PHENERGAN) injection 6.25-12.5 mg  6.25-12.5 mg Intravenous Q4H PRN Michael Boston, MD      . sodium chloride 0.9 % injection 3 mL  3 mL Intravenous Q12H Michael Boston, MD   3 mL at 09/19/15 1003  . sodium chloride 0.9 % injection 3 mL  3 mL Intravenous PRN Michael Boston, MD      . zolpidem Lorrin Mais) tablet 5 mg  5 mg Oral QHS PRN Georganna Skeans, MD   5 mg at 09/19/15 2114     Allergies  Allergen Reactions  . Latex Rash    Disposition: 01-Home or Self Care  Discharge Instructions    Call MD for:  extreme fatigue    Complete by:  As directed      Call MD for:  hives    Complete by:  As directed      Call MD for:  persistant nausea and vomiting    Complete by:  As directed      Call MD for:  redness, tenderness, or signs of infection (pain, swelling, redness, odor or green/yellow discharge around incision site)    Complete by:  As directed      Call MD for:  severe uncontrolled pain    Complete by:  As directed      Call MD for:    Complete by:  As directed   Temperature > 101.21F     Diet - low sodium heart healthy    Complete by:  As directed      Discharge instructions    Complete by:  As directed   Please see discharge instruction sheets.  Also refer to handout given an office.  Please call our office if you have any questions or concerns [phone]     Discharge wound care:    Complete by:  As directed   If you have closed incisions, shower and bathe over these incisions with soap and water every day.  Remove all surgical dressings on postoperative day #3.  You do not need to replace dressings over the closed incisions unless you feel more comfortable with a Band-Aid covering  it.   If you have an open wound that requires packing, please see wound care instructions.  In general, remove all dressings, wash wound with soap and water and then replace with saline moistened gauze.  Do the dressing change at least every day.  Please call our office [phone] if you have further questions.     Driving Restrictions    Complete by:  As directed   No driving until off narcotics and can safely swerve away without pain during an  emergency     Increase activity slowly    Complete by:  As directed   Walk an hour a day.  Use 20-30 minute walks.  When you can walk 30 minutes without difficulty, it is fine to restart low impact/moderate activities such as biking, jogging, swimming, sexual activity, etc.  Eventually you can increase to unrestricted activity when not feeling pain.  If you feel pain: STOP!Marland Kitchen   Let pain protect you from overdoing it.  Use ice/heat & over-the-counter pain medications to help minimize soreness.  If that is not enough, then use your narcotic pain prescription as needed to remain active.  It is better to take extra pain medications and be more active than to stay bedridden to avoid all pain medications.     Lifting restrictions    Complete by:  As directed   Avoid heavy lifting initially.  Do not push through pain.  You have no specific weight limit - if it hurts to do, DON'T DO IT.   If you feel no pain, you are not injuring anything.  Pain will protect you from injury.  Coughing and sneezing are far more stressful to your incision than any lifting.  Avoid resuming heavy lifting / intense activity until off all narcotic pain medications.  When ready to exercise more, give yourself 2 weeks to gradually get back to full intense exercise/activity.     May shower / Bathe    Complete by:  As directed      May walk up steps    Complete by:  As directed      Sexual Activity Restrictions    Complete by:  As directed   Sexual activity as tolerated.  Do not push  through pain.  Pain will protect you from injury.     Walk with assistance    Complete by:  As directed   Walk over an hour a day.  May use a walker/cane/companion to help with balance and stamina.            Medication List    TAKE these medications        aspirin EC 81 MG tablet  Take 81 mg by mouth every morning.     atorvastatin 40 MG tablet  Commonly known as:  LIPITOR  Take 20 mg by mouth at bedtime.     ibuprofen 200 MG tablet  Commonly known as:  ADVIL,MOTRIN  Take 400 mg by mouth every 6 (six) hours as needed for headache or moderate pain.     MEGA MULTIVITAMIN FOR MEN PO  Take 1 tablet by mouth every morning.     mirabegron ER 25 MG Tb24 tablet  Commonly known as:  MYRBETRIQ  Take 25 mg by mouth every morning.     omega-3 acid ethyl esters 1 G capsule  Commonly known as:  LOVAZA  Take 1 g by mouth every morning.     sildenafil 100 MG tablet  Commonly known as:  VIAGRA  Take 100 mg by mouth daily as needed for erectile dysfunction.     Vitamin D3 400 UNITS tablet  Take 400 Units by mouth every morning.          Signed: Morton Peters, M.D., F.A.C.S. Gastrointestinal and Minimally Invasive [facility] [address] [place], [place] [phone] Main / Paging   09/20/2015, 7:00 AM

## 2015-09-20 NOTE — Discharge Instructions (Signed)
Small Bowel Obstruction °A small bowel obstruction is a blockage in the small bowel. The small bowel, which is also called the small intestine, is a long, slender tube that connects the stomach to the colon. When a person eats and drinks, food and fluids go from the stomach to the small bowel. This is where most of the nutrients in the food and fluids are absorbed. °A small bowel obstruction will prevent food and fluids from passing through the small bowel as they normally do during digestion. The small bowel can become partially or completely blocked. This can cause symptoms such as abdominal pain, vomiting, and bloating. If this condition is not treated, it can be dangerous because the small bowel could rupture. °CAUSES °Common causes of this condition include: °· Scar tissue from previous surgery or radiation treatment. °· Recent surgery. This may cause the movements of the bowel to slow down and cause food to block the intestine. °· Hernias. °· Inflammatory bowel disease (colitis). °· Twisting of the bowel (volvulus). °· Tumors. °· A foreign body. °· Slipping of a part of the bowel into another part (intussusception). °SYMPTOMS °Symptoms of this condition include: °· Abdominal pain. This may be dull cramps or sharp pain. It may occur in one area, or it may be present in the entire abdomen. Pain can range from mild to severe, depending on the degree of obstruction. °· Nausea and vomiting. Vomit may be greenish or a yellow bile color. °· Abdominal bloating. °· Constipation. °· Lack of passing gas. °· Frequent belching. °· Diarrhea. This may occur if the obstruction is partial and runny stool is able to leak around the obstruction. °DIAGNOSIS °This condition may be diagnosed based on a physical exam, medical history, and X-rays of the abdomen. You may also have other tests, such as a CT scan of the abdomen and pelvis. °TREATMENT °Treatment for this condition depends on the cause and severity of the problem.  Treatment options may include: °· Bed rest along with fluids and pain medicines that are given through an IV tube inserted into one of your veins. Sometimes, this is all that is needed for the obstruction to improve. °· Following a simple diet. In some cases, a clear liquid diet may be required for several days. This allows the bowel to rest. °· Placement of a small tube (nasogastric tube) into the stomach. When the bowel is blocked, it usually swells up like a balloon that is filled with air and fluids. The air and fluids may be removed by suction through the nasogastric tube. This can help with pain, discomfort, and nausea. It can also help the obstruction to clear up faster. °· Surgery. This may be required if other treatments do not work. Bowel obstruction from a hernia may require early surgery and can be an emergency procedure. Surgery may also be required for scar tissue that causes frequent or severe obstructions. °HOME CARE INSTRUCTIONS °· Get plenty of rest. °· Follow instructions from your health care provider about eating restrictions. You may need to avoid solid foods and consume only clear liquids until your condition improves. °· Take over-the-counter and prescription medicines only as told by your health care provider. °· Keep all follow-up visits as told by your health care provider. This is important. °SEEK MEDICAL CARE IF: °· You have a fever. °· You have chills. °SEEK IMMEDIATE MEDICAL CARE IF: °· You have increased pain or cramping. °· You vomit blood. °· You have uncontrolled vomiting or nausea. °· You cannot drink   fluids because of vomiting or pain.  You develop confusion.  You begin feeling very dry or thirsty (dehydrated).  You have severe bloating.  You feel extremely weak or you faint.   This information is not intended to replace advice given to you by your health care provider. Make sure you discuss any questions you have with your health care provider.   Document Released:  02/02/2006 Document Revised: 08/07/2015 Document Reviewed: 01/10/2015 Elsevier Interactive Patient Education 2016 [place]. Irregular bowel habits such as constipation and diarrhea can lead to many problems over time.  Having one soft bowel movement a day is the most important way to prevent further problems.  The anorectal canal is designed to handle stretching and feces to safely manage our ability to get rid of solid waste (feces, poop, stool) out of our body.  BUT, hard constipated stools can act like ripping concrete bricks and diarrhea can be a burning fire to this very sensitive area of our body, causing inflamed hemorrhoids, anal fissures, increasing risk is perirectal abscesses, abdominal pain/bloating, an making irritable bowel worse.      The goal: ONE SOFT BOWEL MOVEMENT A DAY!  To have soft, regular bowel movements:   Drink plenty of fluids, consider 4-6 tall glasses of water a day.    Take plenty of fiber.  Fiber is the undigested part of plant food that passes into the colon, acting s natures broom to encourage bowel motility and movement.  Fiber can absorb and hold large amounts of water. This results in a larger, bulkier stool, which is soft and easier to pass. Work gradually over several weeks up to 6 servings a day of fiber (25g a day even more if needed) in the form of: o Vegetables -- Root (potatoes, carrots, turnips), leafy green (lettuce, salad greens, celery, spinach), or cooked high residue (cabbage, broccoli, etc) o Fruit -- Fresh (unpeeled skin & pulp), Dried (prunes, apricots, cherries, etc ),  or stewed ( applesauce)  o Whole grain breads, pasta, etc (whole wheat)  o Bran cereals   Bulking Agents -- This type of water-retaining fiber generally is easily obtained each day by one of the following:  o Psyllium bran -- The psyllium plant is remarkable because its ground seeds can retain so much water. This product is available as  Metamucil, Konsyl, Effersyllium, Per Diem Fiber, or the less expensive generic preparation in drug and health food stores. Although labeled a laxative, it really is not a laxative.  o Methylcellulose -- This is another fiber derived from wood which also retains water. It is available as Citrucel. o Polyethylene Glycol - and artificial fiber commonly called Miralax or Glycolax.  It is helpful for people with gassy or bloated feelings with regular fiber o Flax Seed - a less gassy fiber than psyllium  No reading or other relaxing activity while on the toilet. If bowel movements take longer than 5 minutes, you are too constipated  AVOID CONSTIPATION.  High fiber and water intake usually takes care of this.  Sometimes a laxative is needed to stimulate more frequent bowel movements, but   Laxatives are not a good long-term solution as it can wear the colon out.  They can help jump-start bowels if constipated, but should be relied on constantly without discussing with your doctor o Osmotics (Milk of Magnesia, Fleets phosphosoda, Magnesium citrate, MiraLax, GoLytely) are safer than  o Stimulants (Senokot, Castor Oil, Dulcolax, Ex Lax)    o  Avoid taking laxatives for more than 7 days in a row.   IF SEVERELY CONSTIPATED, try a Bowel Retraining Program: o Do not use laxatives.  o Eat a diet high in roughage, such as bran cereals and leafy vegetables.  o Drink six (6) ounces of prune or apricot juice each morning.  o Eat two (2) large servings of stewed fruit each day.  o Take one (1) heaping tablespoon of a psyllium-based bulking agent twice a day. Use sugar-free sweetener when possible to avoid excessive calories.  o Eat a normal breakfast.  o Set aside 15 minutes after breakfast to sit on the toilet, but do not strain to have a bowel movement.  o If you do not have a bowel movement by the third day, use an enema and repeat the above steps.   Controlling diarrhea o Switch to liquids and simpler  foods for a few days to avoid stressing your intestines further. o Avoid dairy products (especially milk & ice cream) for a short time.  The intestines often can lose the ability to digest lactose when stressed. o Avoid foods that cause gassiness or bloating.  Typical foods include beans and other legumes, cabbage, broccoli, and dairy foods.  Every person has some sensitivity to other foods, so listen to our body and avoid those foods that trigger problems for you. o Adding fiber (Citrucel, Metamucil, psyllium, Miralax) gradually can help thicken stools by absorbing excess fluid and retrain the intestines to act more normally.  Slowly increase the dose over a few weeks.  Too much fiber too soon can backfire and cause cramping & bloating. o Probiotics (such as active yogurt, Align, etc) may help repopulate the intestines and colon with normal bacteria and calm down a sensitive digestive tract.  Most studies show it to be of mild help, though, and such products can be costly. o Medicines: - Bismuth subsalicylate (ex. Kayopectate, Pepto Bismol) every 30 minutes for up to 6 doses can help control diarrhea.  Avoid if pregnant. - Loperamide (Immodium) can slow down diarrhea.  Start with two tablets (4mg  total) first and then try one tablet every 6 hours.  Avoid if you are having fevers or severe pain.  If you are not better or start feeling worse, stop all medicines and call your doctor for advice o Call your doctor if you are getting worse or not better.  Sometimes further testing (cultures, endoscopy, X-ray studies, bloodwork, etc) may be needed to help diagnose and treat the cause of the diarrhea.  TROUBLESHOOTING IRREGULAR BOWELS 1) Avoid extremes of bowel movements (no bad constipation/diarrhea) 2) Miralax 17gm mixed in 8oz. water or juice-daily. May use BID as needed.  3) Gas-x,Phazyme, etc. as needed for gas & bloating.  4) Soft,bland diet. No spicy,greasy,fried foods.  5) Prilosec over-the-counter  as needed  6) May hold gluten/wheat products from diet to see if symptoms improve.  7)  May try probiotics (Align, Activa, etc) to help calm the bowels down 7) If symptoms become worse call back immediately.   Exercising to Stay Healthy Exercising regularly is important. It has many health benefits, such as:  Improving your overall fitness, flexibility, and endurance.  Increasing your bone density.  Helping with weight control.  Decreasing your body fat.  Increasing your muscle strength.  Reducing stress and tension.  Improving your overall health. In order to become healthy and stay healthy, it is recommended that you do moderate-intensity and vigorous-intensity exercise. You can tell that you are exercising at  a moderate intensity if you have a higher heart rate and faster breathing, but you are still able to hold a conversation. You can tell that you are exercising at a vigorous intensity if you are breathing much harder and faster and cannot hold a conversation while exercising. HOW OFTEN SHOULD I EXERCISE? Choose an activity that you enjoy and set realistic goals. Your health care provider can help you to make an activity plan that works for you. Exercise regularly as directed by your health care provider. This may include:   Doing resistance training twice each week, such as:  Push-ups.  Sit-ups.  Lifting weights.  Using resistance bands.  Doing a given intensity of exercise for a given amount of time. Choose from these options:  150 minutes of moderate-intensity exercise every week.  75 minutes of vigorous-intensity exercise every week.  A mix of moderate-intensity and vigorous-intensity exercise every week. Children, pregnant women, people who are out of shape, people who are overweight, and older adults may need to consult a health care provider for individual recommendations. If you have any sort of medical condition, be sure to consult your health care provider  before starting a new exercise program.  WHAT ARE SOME EXERCISE IDEAS? Some moderate-intensity exercise ideas include:   Walking at a rate of 1 mile in 15 minutes.  Biking.  Hiking.  Golfing.  Dancing. Some vigorous-intensity exercise ideas include:   Walking at a rate of at least 4.5 miles per hour.  Jogging or running at a rate of 5 miles per hour.  Biking at a rate of at least 10 miles per hour.  Lap swimming.  Roller-skating or in-line skating.  Cross-country skiing.  Vigorous competitive sports, such as football, basketball, and soccer.  Jumping rope.  Aerobic dancing. WHAT ARE SOME EVERYDAY ACTIVITIES THAT CAN HELP ME TO GET EXERCISE?  Yard work, such as:  Psychologist, educational.  Raking and bagging leaves.  Washing and waxing your car.  Pushing a stroller.  Shoveling snow.  Gardening.  Washing windows or floors. HOW CAN I BE MORE ACTIVE IN MY DAY-TO-DAY ACTIVITIES?  Use the stairs instead of the elevator.  Take a walk during your lunch break.  If you drive, park your car farther away from work or school.  If you take public transportation, get off one stop early and walk the rest of the way.  Make all of your phone calls while standing up and walking around.  Get up, stretch, and walk around every 30 minutes throughout the day. WHAT GUIDELINES SHOULD I FOLLOW WHILE EXERCISING?  Do not exercise so much that you hurt yourself, feel dizzy, or get very short of breath.  Consult your health care provider before starting a new exercise program.  Wear comfortable clothes and shoes with good support.  Drink plenty of water while you exercise to prevent dehydration or heat stroke. Body water is lost during exercise and must be replaced.  Work out until you breathe faster and your heart beats faster.   This information is not intended to replace advice given to you by your health care provider. Make sure you discuss any questions you have with your  health care provider.   Document Released: 12/19/2010 Document Revised: 12/07/2014 Document Reviewed: 04/19/2014 Elsevier Interactive Patient Education 2016 [place].  Managing Pain  Pain after surgery or related to activity is often due to strain/injury to muscle, tendon, nerves and/or incisions.  This pain is usually short-term and will improve in a  few months.   Many people find it helpful to do the following things TOGETHER to help speed the process of healing and to get back to regular activity more quickly:  1. Avoid heavy physical activity at first a. No lifting greater than 20 pounds at first, then increase to lifting as tolerated over the next few weeks b. Do not push through the pain.  Listen to your body and avoid positions and maneuvers than reproduce the pain.  Wait a few days before trying something more intense c. Walking is okay as tolerated, but go slowly and stop when getting sore.  If you can walk 30 minutes without stopping or pain, you can try more intense activity (running, jogging, aerobics, cycling, swimming, treadmill, sex, sports, weightlifting, etc ) d. Remember: If it hurts to do it, then dont do it!  2. Take Anti-inflammatory medication a. Choose ONE of the following over-the-counter medications: i.            Acetaminophen 500mg  tabs (Tylenol) 1-2 pills with every meal and just before bedtime (avoid if you have liver problems) ii.            Naproxen 220mg  tabs (ex. Aleve) 1-2 pills twice a day (avoid if you have kidney, stomach, IBD, or bleeding problems) iii. Ibuprofen 200mg  tabs (ex. Advil, Motrin) 3-4 pills with every meal and just before bedtime (avoid if you have kidney, stomach, IBD, or bleeding problems) b. Take with food/snack around the clock for 1-2 weeks i. This helps the muscle and nerve tissues become less irritable and calm down faster  3. Use a Heating pad or Ice/Cold Pack a. 4-6 times a day b. May use warm bath/hottub  or  showers  4. Try Gentle Massage and/or Stretching  a. at the area of pain many times a day b. stop if you feel pain - do not overdo it  Try these steps together to help you body heal faster and avoid making things get worse.  Doing just one of these things may not be enough.    If you are not getting better after two weeks or are noticing you are getting worse, contact our office for further advice; we may need to re-evaluate you & see what other things we can do to help.

## 2015-12-06 ENCOUNTER — Inpatient Hospital Stay (HOSPITAL_COMMUNITY)
Admission: EM | Admit: 2015-12-06 | Discharge: 2015-12-09 | DRG: 390 | Disposition: A | Discharge status: Home / Self Care | Payer: Medicare Other | Attending: General Surgery | Admitting: General Surgery

## 2015-12-06 ENCOUNTER — Inpatient Hospital Stay (HOSPITAL_COMMUNITY): Payer: Medicare Other

## 2015-12-06 ENCOUNTER — Encounter (HOSPITAL_COMMUNITY): Payer: Self-pay | Admitting: *Deleted

## 2015-12-06 ENCOUNTER — Emergency Department (HOSPITAL_COMMUNITY): Payer: Medicare Other

## 2015-12-06 DIAGNOSIS — Z9049 Acquired absence of other specified parts of digestive tract: Secondary | ICD-10-CM

## 2015-12-06 DIAGNOSIS — K5790 Diverticulosis of intestine, part unspecified, without perforation or abscess without bleeding: Secondary | ICD-10-CM | POA: Diagnosis present

## 2015-12-06 DIAGNOSIS — Z923 Personal history of irradiation: Secondary | ICD-10-CM

## 2015-12-06 DIAGNOSIS — Z87891 Personal history of nicotine dependence: Secondary | ICD-10-CM

## 2015-12-06 DIAGNOSIS — R1030 Lower abdominal pain, unspecified: Secondary | ICD-10-CM | POA: Diagnosis present

## 2015-12-06 DIAGNOSIS — Z8546 Personal history of malignant neoplasm of prostate: Secondary | ICD-10-CM

## 2015-12-06 DIAGNOSIS — K5660 Unspecified intestinal obstruction: Secondary | ICD-10-CM | POA: Diagnosis present

## 2015-12-06 DIAGNOSIS — I10 Essential (primary) hypertension: Secondary | ICD-10-CM | POA: Diagnosis present

## 2015-12-06 DIAGNOSIS — E785 Hyperlipidemia, unspecified: Secondary | ICD-10-CM | POA: Diagnosis present

## 2015-12-06 DIAGNOSIS — Z7982 Long term (current) use of aspirin: Secondary | ICD-10-CM

## 2015-12-06 DIAGNOSIS — K56609 Unspecified intestinal obstruction, unspecified as to partial versus complete obstruction: Secondary | ICD-10-CM | POA: Diagnosis present

## 2015-12-06 LAB — LIPASE, BLOOD: LIPASE: 38 U/L (ref 11–51)

## 2015-12-06 LAB — URINALYSIS, ROUTINE W REFLEX MICROSCOPIC
BILIRUBIN URINE: NEGATIVE
GLUCOSE, UA: NEGATIVE mg/dL
Hgb urine dipstick: NEGATIVE
KETONES UR: 15 mg/dL — AB
LEUKOCYTES UA: NEGATIVE
Nitrite: NEGATIVE
PH: 5.5 (ref 5.0–8.0)
Protein, ur: NEGATIVE mg/dL
Specific Gravity, Urine: 1.028 (ref 1.005–1.030)

## 2015-12-06 LAB — CBC
HEMATOCRIT: 42.1 % (ref 39.0–52.0)
Hemoglobin: 14 g/dL (ref 13.0–17.0)
MCH: 30.9 pg (ref 26.0–34.0)
MCHC: 33.3 g/dL (ref 30.0–36.0)
MCV: 92.9 fL (ref 78.0–100.0)
Platelets: 233 10*3/uL (ref 150–400)
RBC: 4.53 MIL/uL (ref 4.22–5.81)
RDW: 13 % (ref 11.5–15.5)
WBC: 7.1 10*3/uL (ref 4.0–10.5)

## 2015-12-06 LAB — COMPREHENSIVE METABOLIC PANEL
ALBUMIN: 4 g/dL (ref 3.5–5.0)
ALT: 27 U/L (ref 17–63)
AST: 26 U/L (ref 15–41)
Alkaline Phosphatase: 70 U/L (ref 38–126)
Anion gap: 9 (ref 5–15)
BUN: 19 mg/dL (ref 6–20)
CHLORIDE: 103 mmol/L (ref 101–111)
CO2: 30 mmol/L (ref 22–32)
Calcium: 9.5 mg/dL (ref 8.9–10.3)
Creatinine, Ser: 1.11 mg/dL (ref 0.61–1.24)
GFR calc Af Amer: >60 mL/min (ref >60)
GFR calc non Af Amer: >60 mL/min (ref >60)
GLUCOSE: 117 mg/dL — AB (ref 65–99)
POTASSIUM: 4.3 mmol/L (ref 3.5–5.1)
SODIUM: 142 mmol/L (ref 135–145)
Total Bilirubin: 0.5 mg/dL (ref 0.3–1.2)
Total Protein: 6.8 g/dL (ref 6.5–8.1)

## 2015-12-06 MED ORDER — IOHEXOL 300 MG/ML  SOLN
100.0000 mL | Freq: Once | INTRAMUSCULAR | Status: AC | PRN
  Administered 2015-12-06: 100 mL via INTRAVENOUS

## 2015-12-06 MED ORDER — ONDANSETRON 4 MG PO TBDP
ORAL_TABLET | ORAL | Status: AC
  Filled 2015-12-06: qty 1

## 2015-12-06 MED ORDER — ONDANSETRON 4 MG PO TBDP
4.0000 mg | ORAL_TABLET | Freq: Once | ORAL | Status: AC | PRN
  Administered 2015-12-06: 4 mg via ORAL

## 2015-12-06 MED ORDER — IOHEXOL 300 MG/ML  SOLN: 25.0000 mL | Freq: Once | INTRAMUSCULAR | Status: DC | PRN

## 2015-12-06 MED ORDER — FENTANYL CITRATE (PF) 100 MCG/2ML IJ SOLN
INTRAMUSCULAR | Status: AC
  Filled 2015-12-06: qty 2

## 2015-12-06 MED ORDER — MORPHINE SULFATE (PF) 4 MG/ML IV SOLN
4.0000 mg | Freq: Once | INTRAVENOUS | Status: AC
  Administered 2015-12-06: 4 mg via INTRAVENOUS
  Filled 2015-12-06: qty 1

## 2015-12-06 MED ORDER — FENTANYL CITRATE (PF) 100 MCG/2ML IJ SOLN
50.0000 ug | Freq: Once | INTRAMUSCULAR | Status: AC
  Administered 2015-12-06: 50 ug via NASAL

## 2015-12-06 MED ORDER — ONDANSETRON HCL 4 MG/2ML IJ SOLN
4.0000 mg | Freq: Once | INTRAMUSCULAR | Status: AC
  Administered 2015-12-06: 4 mg via INTRAVENOUS
  Filled 2015-12-06: qty 2

## 2015-12-06 MED ORDER — ONDANSETRON HCL 4 MG/2ML IJ SOLN
4.0000 mg | Freq: Once | INTRAMUSCULAR | Status: AC
  Administered 2015-12-07: 4 mg via INTRAVENOUS
  Filled 2015-12-06: qty 2

## 2015-12-06 NOTE — ED Notes (Signed)
Pt reports onset of abd pain at 2pm after lunch. Recent hx of SBO and this feels the same. Last bm was this morning. Denies n/v.

## 2015-12-06 NOTE — ED Provider Notes (Signed)
CSN: [id number]     Arrival date & time 12/06/15  1642 History   First MD Initiated Contact with Patient 12/06/15 1920     Chief Complaint  Patient presents with  . Abdominal Pain     (Consider location/radiation/quality/duration/timing/severity/associated sxs/prior Treatment) Patient is a 75 y.o. male presenting with abdominal pain. The history is provided by the patient and medical records.  Abdominal Pain Associated symptoms: nausea     75 year old male with history of diverticular disease, hyperlipidemia, hypertension, presenting to the ED for abdominal pain. Patient states he has a history of recurrent small bowel obstruction in this feels the same. He states he went out to eat lunch with his wife today-- he reports he ate bread, clam chowder, half of a shrimp sandwich, and some iced tea. He states afterwards he did not feel overly full but as the day progressed began having increasing pain. He states he has had some nausea but denies vomiting. He had a normal bowel movement this morning. He denies any fever or chills. Patient was recently admitted for small bowel obstruction in October, he prefers to see Dr. Johney Maine.  Past Medical History  Diagnosis Date  . Cerebral artery disease   . Diverticular disease   . Hyperlipidemia   . Hypertension     hx. of . no meds since 2004  . Prostate cancer [facility])     treated with radiation  . Basal cell carcinoma     "sqattered"  . Squamous carcinoma ([place])     "sqattered"  . Heart murmur     "after surgeries before"  . Stroke [facility]) 1979    no deficits   Past Surgical History  Procedure Laterality Date  . Partial colectomy  01/1999    Hartmans procedure with colostomy  . Small bowel repair  03/1999    postop abscesses with fistula.  Abscesses drained.  Fistula repaired.  . Colostomy takedown  08/1999  . Incisional hernia repair  2001    open w mesh  . Laparoscopic incisional / umbilical / ventral hernia repair  2008    with mesh.  LLQ old  colostomy site  . Remove suture under anesthesia  2011    stitch abscesses LLQ & midline  . Ventral hernia repair N/A 04/23/2015    Procedure: LAPAROSCOPIC VENTRAL WALL HERNIA REPAIR AND LAP LYSIS OF ADHESIONS;  Surgeon: Michael Boston, MD;  Location: [facility] ORS;  Service: General;  Laterality: N/A;  With MESH  . Lysis of adhesion N/A 04/23/2015    Procedure: LYSIS OF ADHESION;  Surgeon: Michael Boston, MD;  Location: [facility] OR[facility];  Service: General;  Laterality: N/A;  . Colon surgery    . Hernia repair      "lots; had diverticulitis years ago"  . Prostate biopsy  ~ 2002   History reviewed. No pertinent family history. Social History  Substance Use Topics  . Smoking status: Former Smoker -- 2.00 packs/day for 47 years    Types: Cigarettes    Quit date: 11/30/2004  . Smokeless tobacco: Never Used  . Alcohol Use: Yes     Comment: recovering alcoholic sober since [id number]    Review of Systems  Gastrointestinal: Positive for nausea and abdominal pain.  All other systems reviewed and are negative.     Allergies  Latex  Home Medications   Prior to Admission medications   Medication Sig Start Date End Date Taking? Authorizing Provider  aspirin EC 81 MG tablet Take 81 mg by mouth every morning.  Historical Provider, MD  atorvastatin (LIPITOR) 40 MG tablet Take 20 mg by mouth at bedtime.    Historical Provider, MD  Cholecalciferol (VITAMIN D3) 400 UNITS tablet Take 400 Units by mouth every morning.     Historical Provider, MD  ibuprofen (ADVIL,MOTRIN) 200 MG tablet Take 400 mg by mouth every 6 (six) hours as needed for headache or moderate pain.    Historical Provider, MD  mirabegron ER (MYRBETRIQ) 25 MG TB24 tablet Take 25 mg by mouth every morning.    Historical Provider, MD  Multiple Vitamins-Minerals (MEGA MULTIVITAMIN FOR MEN PO) Take 1 tablet by mouth every morning.     Historical Provider, MD  omega-3 acid ethyl esters (LOVAZA) 1 G capsule Take 1 g by mouth every morning.     Historical  Provider, MD  sildenafil (VIAGRA) 100 MG tablet Take 100 mg by mouth daily as needed for erectile dysfunction.     Historical Provider, MD   BP 196/75 mmHg  Pulse 70  Temp(Src) 98 F (36.7 C) (Oral)  Resp 18  SpO2 96%   Physical Exam  Constitutional: He is oriented to person, place, and time. He appears well-developed and well-nourished.  HENT:  Head: Normocephalic and atraumatic.  Mouth/Throat: Oropharynx is clear and moist.  Eyes: Conjunctivae and EOM are normal. Pupils are equal, round, and reactive to light.  Neck: Normal range of motion.  Cardiovascular: Normal rate, regular rhythm and normal heart sounds.   Pulmonary/Chest: Effort normal and breath sounds normal. No respiratory distress. He has no wheezes.  Abdominal: Soft. Bowel sounds are normal. He exhibits distension. There is no tenderness. There is no guarding.  Abdomen appears distended, normal bowel sounds  Musculoskeletal: Normal range of motion.  Neurological: He is alert and oriented to person, place, and time.  Skin: Skin is warm and dry.  Psychiatric: He has a normal mood and affect.  Nursing note and vitals reviewed.   ED Course  Procedures (including critical care time) Labs Review Labs Reviewed  COMPREHENSIVE METABOLIC PANEL - Abnormal; Notable for the following:    Glucose, Bld 117 (*)    All other components within normal limits  LIPASE, BLOOD  CBC  URINALYSIS, ROUTINE W REFLEX MICROSCOPIC (NOT AT [facility])    Imaging Review Dg Abd Acute W/chest  12/06/2015  CLINICAL DATA:  Lower abdominal pain for 2 days EXAM: DG ABDOMEN ACUTE W/ 1V CHEST COMPARISON:  Multiple prior studies FINDINGS: Chronic scarring lateral left lung base. Stable mild cardiac enlargement compared to 2012. Stable aortic calcification. No free air. Air-fluid level within central abdominal small bowel loops showing minimal dilatation. Relatively little gas seen in the abdomen otherwise with some gas in stomach and transverse colon.  IMPRESSION: Consistent with this. Small bowel obstruction, minimally dilated loop of small bowel central abdomen with otherwise paucity of abdominal gas concerning for recurrent small bowel obstruction. Electronically Signed   By: Skipper Cliche M.D.   On: 12/06/2015 18:35   I have personally reviewed and evaluated these images and lab results as part of my medical decision-making.   EKG Interpretation None      MDM   Final diagnoses:  Small bowel obstruction ([place])   75 year old male here with abdominal pain after eating heavy lunch today. History of SBO and reports this feels the same. Labwork is reassuring.  Patient's abdomen appears distended on exam but continues to have normal bowel sounds.  Lab work reassuring.  Acute abd series with evidence of recurrent SBO.  Case discussed with OR  nurse as Dr. Barry Dienes in surgery-- will come evaluate once she is done.  Patient given morphine, will place NG tube as patient responded well to this in the past.  Care signed out to [place] pending general surgery evaluation.    Larene Pickett, PA-C 12/06/15 1953  Fredia Sorrow, MD 12/11/15 [phone]

## 2015-12-06 NOTE — H&P (Signed)
Matthew Hodges is an 75 y.o. male.   Chief Complaint: bowel obstruction HPI:  Patient is a 75 year old male with extensive history of small bowel obstruction. He most recently had a hernia repair in May 2016 with Dr. Johney Maine.  This is associated with 2 hour lysis of adhesions. He was doing fine until today after lunch. He started feeling the usual abdominal pain, on. By a few more hours and was excruciating and he got to the hospital around 4:00. He has been admitted multiple times for bowel obstructions that resolved reasonably quickly with NG tube decompression. However, he continues to bounce back and short anterolateral walls. His last admission to [facility] was in October.  He has not noted any difference in the pain when he is mobile. His last BM was this morning, but he has not passed gas all day.    Past Medical History  Diagnosis Date  . Cerebral artery disease   . Diverticular disease   . Hyperlipidemia   . Hypertension     hx. of . no meds since 2004  . Prostate cancer [facility])     treated with radiation  . Basal cell carcinoma     "sqattered"  . Squamous carcinoma ([place])     "sqattered"  . Heart murmur     "after surgeries before"  . Stroke [facility]) 1979    no deficits    Past Surgical History  Procedure Laterality Date  . Partial colectomy  01/1999    Hartmans procedure with colostomy  . Small bowel repair  03/1999    postop abscesses with fistula.  Abscesses drained.  Fistula repaired.  . Colostomy takedown  08/1999  . Incisional hernia repair  2001    open w mesh  . Laparoscopic incisional / umbilical / ventral hernia repair  2008    with mesh.  LLQ old colostomy site  . Remove suture under anesthesia  2011    stitch abscesses LLQ & midline  . Ventral hernia repair N/A 04/23/2015    Procedure: LAPAROSCOPIC VENTRAL WALL HERNIA REPAIR AND LAP LYSIS OF ADHESIONS;  Surgeon: Michael Boston, MD;  Location: [facility];  Service: General;  Laterality: N/A;  With MESH  . Lysis of  adhesion N/A 04/23/2015    Procedure: LYSIS OF ADHESION;  Surgeon: Michael Boston, MD;  Location: [facility];  Service: General;  Laterality: N/A;  . Colon surgery    . Hernia repair      "lots; had diverticulitis years ago"  . Prostate biopsy  ~ 2002    History reviewed. No pertinent family history. Social History:  reports that he quit smoking about 11 years ago. His smoking use included Cigarettes. He has a 94 pack-year smoking history. He has never used smokeless tobacco. He reports that he drinks alcohol. He reports that he does not use illicit drugs.  Allergies:  Allergies  Allergen Reactions  . Latex Rash   Medication  aspirin EC 81 MG tablet   atorvastatin (LIPITOR) 40 MG tablet   Cholecalciferol (VITAMIN D3) 400 UNITS tablet   ibuprofen (ADVIL,MOTRIN) 200 MG tablet   mirabegron ER (MYRBETRIQ) 25 MG TB24 tablet   Multiple Vitamins-Minerals (MEGA MULTIVITAMIN FOR MEN PO)   omega-3 acid ethyl esters (LOVAZA) 1 G capsule   sildenafil (VIAGRA) 100 MG tablet         Results for orders placed or performed during the hospital encounter of 12/06/15 (from the past 48 hour(s))  Lipase, blood     Status: None  Collection Time: 12/06/15  5:13 PM  Result Value Ref Range   Lipase 38 11 - 51 U/L  Comprehensive metabolic panel     Status: Abnormal   Collection Time: 12/06/15  5:13 PM  Result Value Ref Range   Sodium 142 135 - 145 mmol/L   Potassium 4.3 3.5 - 5.1 mmol/L   Chloride 103 101 - 111 mmol/L   CO2 30 22 - 32 mmol/L   Glucose, Bld 117 (H) 65 - 99 mg/dL   BUN 19 6 - 20 mg/dL   Creatinine, Ser 1.11 0.61 - 1.24 mg/dL   Calcium 9.5 8.9 - 10.3 mg/dL   Total Protein 6.8 6.5 - 8.1 g/dL   Albumin 4.0 3.5 - 5.0 g/dL   AST 26 15 - 41 U/L   ALT 27 17 - 63 U/L   Alkaline Phosphatase 70 38 - 126 U/L   Total Bilirubin 0.5 0.3 - 1.2 mg/dL   GFR calc non Af Amer >60 >60 mL/min   GFR calc Af Amer >60 >60 mL/min    Comment: (NOTE) The eGFR has been calculated using the CKD EPI  equation. This calculation has not been validated in all clinical situations. eGFR's persistently <60 mL/min signify possible Chronic Kidney Disease.    Anion gap 9 5 - 15  CBC     Status: None   Collection Time: 12/06/15  5:13 PM  Result Value Ref Range   WBC 7.1 4.0 - 10.5 K/uL   RBC 4.53 4.22 - 5.81 MIL/uL   Hemoglobin 14.0 13.0 - 17.0 g/dL   HCT 42.1 39.0 - 52.0 %   MCV 92.9 78.0 - 100.0 fL   MCH 30.9 26.0 - 34.0 pg   MCHC 33.3 30.0 - 36.0 g/dL   RDW 13.0 11.5 - 15.5 %   Platelets 233 150 - 400 K/uL  Urinalysis, Routine w reflex microscopic (not at [facility])     Status: Abnormal   Collection Time: 12/06/15  8:00 PM  Result Value Ref Range   Color, Urine AMBER (A) YELLOW    Comment: BIOCHEMICALS MAY BE AFFECTED BY COLOR   APPearance CLEAR CLEAR   Specific Gravity, Urine 1.028 1.005 - 1.030   pH 5.5 5.0 - 8.0   Glucose, UA NEGATIVE NEGATIVE mg/dL   Hgb urine dipstick NEGATIVE NEGATIVE   Bilirubin Urine NEGATIVE NEGATIVE   Ketones, ur 15 (A) NEGATIVE mg/dL   Protein, ur NEGATIVE NEGATIVE mg/dL   Nitrite NEGATIVE NEGATIVE   Leukocytes, UA NEGATIVE NEGATIVE    Comment: MICROSCOPIC NOT DONE ON URINES WITH NEGATIVE PROTEIN, BLOOD, LEUKOCYTES, NITRITE, OR GLUCOSE <1000 mg/dL.   Dg Abd Acute W/chest  12/06/2015  CLINICAL DATA:  Lower abdominal pain for 2 days EXAM: DG ABDOMEN ACUTE W/ 1V CHEST COMPARISON:  Multiple prior studies FINDINGS: Chronic scarring lateral left lung base. Stable mild cardiac enlargement compared to 2012. Stable aortic calcification. No free air. Air-fluid level within central abdominal small bowel loops showing minimal dilatation. Relatively little gas seen in the abdomen otherwise with some gas in stomach and transverse colon. IMPRESSION: Consistent with this. Small bowel obstruction, minimally dilated loop of small bowel central abdomen with otherwise paucity of abdominal gas concerning for recurrent small bowel obstruction. Electronically Signed   By: Skipper Cliche M.D.   On: 12/06/2015 18:35    Review of Systems  Constitutional: Negative.   HENT: Negative.   Eyes: Negative.   Respiratory: Negative.   Cardiovascular: Negative.   Gastrointestinal: Positive for nausea and abdominal pain.  Genitourinary:       Urinary retention, takes meds   Musculoskeletal: Negative.   Skin: Negative.   Neurological: Negative.   Endo/Heme/Allergies: Negative.   Psychiatric/Behavioral: Negative.     Blood pressure 196/75, pulse 70, temperature 98 F (36.7 C), temperature source Oral, resp. rate 18, SpO2 96 %. Physical Exam  Constitutional: He is oriented to person, place, and time. He appears well-developed and well-nourished. He appears distressed.  HENT:  Head: Normocephalic and atraumatic.  Eyes: Pupils are equal, round, and reactive to light. No scleral icterus.  Neck: Normal range of motion.  Cardiovascular: Normal rate and regular rhythm.   Respiratory: Effort normal. No respiratory distress.  GI: Soft. He exhibits distension (mildly distended). He exhibits no mass. There is no rebound and no guarding.  Musculoskeletal: Normal range of motion.  Neurological: He is alert and oriented to person, place, and time.  Skin: Skin is warm and dry. No rash noted. He is not diaphoretic. No erythema.  Psychiatric: He has a normal mood and affect. His behavior is normal. Judgment and thought content normal.     Assessment/Plan p SBO. NPO NGT  Hope for success with non operative therapy given extensive past surgical history   Kateleen Encarnacion 12/06/2015, 8:55 PM

## 2015-12-07 LAB — BASIC METABOLIC PANEL
ANION GAP: 8 (ref 5–15)
BUN: 21 mg/dL — ABNORMAL HIGH (ref 6–20)
CHLORIDE: 110 mmol/L (ref 101–111)
CO2: 22 mmol/L (ref 22–32)
Calcium: 8.5 mg/dL — ABNORMAL LOW (ref 8.9–10.3)
Creatinine, Ser: 1.06 mg/dL (ref 0.61–1.24)
GFR calc Af Amer: >60 mL/min (ref >60)
GLUCOSE: 158 mg/dL — AB (ref 65–99)
POTASSIUM: 4.5 mmol/L (ref 3.5–5.1)
Sodium: 140 mmol/L (ref 135–145)

## 2015-12-07 LAB — CBC
HCT: 41.4 % (ref 39.0–52.0)
HEMOGLOBIN: 13.5 g/dL (ref 13.0–17.0)
MCH: 30.8 pg (ref 26.0–34.0)
MCHC: 32.6 g/dL (ref 30.0–36.0)
MCV: 94.3 fL (ref 78.0–100.0)
Platelets: 211 10*3/uL (ref 150–400)
RBC: 4.39 MIL/uL (ref 4.22–5.81)
RDW: 13.4 % (ref 11.5–15.5)
WBC: 8.1 10*3/uL (ref 4.0–10.5)

## 2015-12-07 LAB — PHOSPHORUS: Phosphorus: 3.7 mg/dL (ref 2.5–4.6)

## 2015-12-07 LAB — MAGNESIUM: Magnesium: 1.7 mg/dL (ref 1.7–2.4)

## 2015-12-07 MED ORDER — WHITE PETROLATUM GEL
Status: AC
  Administered 2015-12-07: 0.2
  Filled 2015-12-07: qty 1

## 2015-12-07 MED ORDER — HYDRALAZINE HCL 20 MG/ML IJ SOLN: 20.0000 mg | INTRAMUSCULAR | Status: DC | PRN

## 2015-12-07 MED ORDER — ATORVASTATIN CALCIUM 20 MG PO TABS
20.0000 mg | ORAL_TABLET | Freq: Every day | ORAL | Status: DC
  Administered 2015-12-07 – 2015-12-08 (×2): 20 mg via ORAL
  Filled 2015-12-07 (×2): qty 1

## 2015-12-07 MED ORDER — MIRABEGRON ER 25 MG PO TB24
25.0000 mg | ORAL_TABLET | Freq: Every morning | ORAL | Status: DC
  Administered 2015-12-07 – 2015-12-09 (×3): 25 mg via ORAL
  Filled 2015-12-07 (×3): qty 1

## 2015-12-07 MED ORDER — ONDANSETRON 4 MG PO TBDP: 4.0000 mg | ORAL_TABLET | Freq: Four times a day (QID) | ORAL | Status: DC | PRN

## 2015-12-07 MED ORDER — DIPHENHYDRAMINE HCL 12.5 MG/5ML PO ELIX
12.5000 mg | ORAL_SOLUTION | Freq: Four times a day (QID) | ORAL | Status: DC | PRN
  Administered 2015-12-08: 12.5 mg via ORAL
  Filled 2015-12-07 (×2): qty 10

## 2015-12-07 MED ORDER — DIPHENHYDRAMINE HCL 50 MG/ML IJ SOLN
12.5000 mg | Freq: Four times a day (QID) | INTRAMUSCULAR | Status: DC | PRN
  Administered 2015-12-07: 12.5 mg via INTRAVENOUS
  Filled 2015-12-07: qty 1

## 2015-12-07 MED ORDER — MORPHINE SULFATE (PF) 2 MG/ML IV SOLN
1.0000 mg | INTRAVENOUS | Status: DC | PRN
  Administered 2015-12-07 (×2): 2 mg via INTRAVENOUS
  Filled 2015-12-07 (×2): qty 1

## 2015-12-07 MED ORDER — ENOXAPARIN SODIUM 40 MG/0.4ML ~~LOC~~ SOLN
40.0000 mg | Freq: Every day | SUBCUTANEOUS | Status: DC
  Administered 2015-12-07 – 2015-12-08 (×2): 40 mg via SUBCUTANEOUS
  Filled 2015-12-07 (×3): qty 0.4

## 2015-12-07 MED ORDER — KCL IN DEXTROSE-NACL 20-5-0.45 MEQ/L-%-% IV SOLN
INTRAVENOUS | Status: DC
  Administered 2015-12-07 – 2015-12-08 (×4): via INTRAVENOUS
  Administered 2015-12-09: 1 mL via INTRAVENOUS
  Filled 2015-12-07 (×5): qty 1000

## 2015-12-07 MED ORDER — ONDANSETRON HCL 4 MG/2ML IJ SOLN: 4.0000 mg | Freq: Four times a day (QID) | INTRAMUSCULAR | Status: DC | PRN

## 2015-12-07 NOTE — Progress Notes (Signed)
Patient ID: Matthew Hodges, male   DOB: 1941-11-29, 75 y.o.   MRN: [id number]    Subjective: Feels much better this morning. Pain is resolved. He has had flatus.  Objective: Vital signs in last 24 hours: Temp:  [97.8 F (36.6 C)-98.9 F (37.2 C)] 98.9 F (37.2 C) (01/07 0514) Pulse Rate:  [69-90] 90 (01/07 0514) Resp:  [16-18] 17 (01/07 0514) BP: (112-196)/(54-92) 112/58 mmHg (01/07 0514) SpO2:  [92 %-97 %] 92 % (01/07 0514) Weight:  [91.3 kg (201 lb 4.5 oz)] 91.3 kg (201 lb 4.5 oz) (01/07 0026) Last BM Date: 12/06/15  Intake/Output from previous day: 01/06 0701 - 01/07 0700 In: 398.8 [I.V.:398.8] Out: 850 [Urine:300; Emesis/NG output:550] Intake/Output this shift:    General appearance: alert, cooperative and no distress GI: soft, nontender, nondistended. No palpable hernias. No masses.  Lab Results:   Recent Labs  12/06/15 1713 12/07/15 0325  WBC 7.1 8.1  HGB 14.0 13.5  HCT 42.1 41.4  PLT 233 211   BMET  Recent Labs  12/06/15 1713 12/07/15 0325  NA 142 140  K 4.3 4.5  CL 103 110  CO2 30 22  GLUCOSE 117* 158*  BUN 19 21*  CREATININE 1.11 1.06  CALCIUM 9.5 8.5*     Studies/Results: Ct Abdomen Pelvis W Contrast  12/07/2015  CLINICAL DATA:  History of perforated diverticulitis, and recurrent small bowel obstruction. Now with LEFT abdominal pain and swelling for 2 days, symptoms similar to prior bouts of diverticulitis. History of partial colectomy, small bowel repair. EXAM: CT ABDOMEN AND PELVIS WITH CONTRAST TECHNIQUE: Multidetector CT imaging of the abdomen and pelvis was performed using the standard protocol following bolus administration of intravenous contrast. CONTRAST:  141mL OMNIPAQUE IOHEXOL 300 MG/ML  SOLN COMPARISON:  CT abdomen and pelvis September 16, 2015 and abdominal radiograph December 06, 2015 at 1611 hours FINDINGS: LUNG BASES: Included view of the lung bases are clear. Visualized heart and pericardium are unremarkable. SOLID ORGANS: The liver,  spleen, gallbladder, pancreas and adrenal glands are unremarkable. GASTROINTESTINAL TRACT: Recurrent small bowel obstruction, fluid filled small bowel measures up to 3.5 cm with multiple transition points in the LEFT lower quadrant; status post LEFT abdominal herniorrhaphy with mesh. Loops of small bowel appear adherent to the anterior abdominal wall. Large bowel is decompressed. Moderate predominately descending colonic diverticulosis without superimposed acute diverticulitis. Normal appendix. KIDNEYS/ URINARY TRACT: Horseshoe kidney demonstrating symmetric enhancement. No nephrolithiasis, hydronephrosis or solid renal masses. Too small to characterize hypodensity in bilateral renal moieties . The unopacified ureters are normal in course and caliber. Delayed imaging through the kidneys demonstrates symmetric prompt contrast excretion within the proximal urinary collecting system. Urinary bladder is partially distended and unremarkable. PERITONEUM/RETROPERITONEUM: Aortoiliac vessels are normal in course and caliber ; severe calcific atherosclerosis nearly effaces the infrarenal aortic lumen, unchanged. No lymphadenopathy by CT size criteria. Prostate size is normal. No intraperitoneal free fluid nor free air. SOFT TISSUE/OSSEOUS STRUCTURES: Non-suspicious. Moderate fat containing inguinal hernias. S-type scoliosis. Moderate to severe degenerative change of lumbar spine. IMPRESSION: Recurrent partial versus early small bowel obstruction with transition points in LEFT lower quadrant, similar in distribution to prior CT, likely from adhesions. Diverticulosis without CT findings of acute diverticulitis. Severe calcific atherosclerosis results in probable high-grade stenosis infrarenal aortic, stable from prior imaging. Electronically Signed   By: Elon Alas M.D.   On: 12/07/2015 00:17   Dg Abd Acute W/chest  12/06/2015  CLINICAL DATA:  Lower abdominal pain for 2 days EXAM: DG ABDOMEN ACUTE W/  1V CHEST  COMPARISON:  Multiple prior studies FINDINGS: Chronic scarring lateral left lung base. Stable mild cardiac enlargement compared to 2012. Stable aortic calcification. No free air. Air-fluid level within central abdominal small bowel loops showing minimal dilatation. Relatively little gas seen in the abdomen otherwise with some gas in stomach and transverse colon. IMPRESSION: Consistent with this. Small bowel obstruction, minimally dilated loop of small bowel central abdomen with otherwise paucity of abdominal gas concerning for recurrent small bowel obstruction. Electronically Signed   By: Skipper Cliche M.D.   On: 12/06/2015 18:35    Anti-infectives: Anti-infectives    None      Assessment/Plan: Recurrent small bowel obstruction with history of multiple abdominal procedures. This episode is quickly resolving. Continue NG today. Plain abdominal x-rays in the morning.    LOS: 1 day    Mikaela Hilgeman T 12/07/2015

## 2015-12-08 ENCOUNTER — Inpatient Hospital Stay (HOSPITAL_COMMUNITY): Payer: Medicare Other

## 2015-12-08 NOTE — Progress Notes (Signed)
Patient ID: Matthew Hodges, male   DOB: 11-04-1941, 75 y.o.   MRN: [id number]    Subjective: No pain since NG was placed. Has had a bowel movement. He is hungry.  Objective: Vital signs in last 24 hours: Temp:  [97.8 F (36.6 C)-98.2 F (36.8 C)] 97.8 F (36.6 C) (01/08 0641) Pulse Rate:  [78-84] 78 (01/08 0641) Resp:  [18-19] 19 (01/08 0641) BP: (128-173)/(61-83) 173/70 mmHg (01/08 0641) SpO2:  [94 %-96 %] 94 % (01/08 0641) Last BM Date: 12/06/15  Intake/Output from previous day: 01/07 0701 - 01/08 0700 In: 1913.5 [P.O.:120; I.V.:1793.5] Out: 1950 [Urine:1650; Emesis/NG output:300] Intake/Output this shift:    General appearance: alert, cooperative and no distress GI: nondistended. Soft with minimal midabdominal tenderness and no guarding  Lab Results:   Recent Labs  12/06/15 1713 12/07/15 0325  WBC 7.1 8.1  HGB 14.0 13.5  HCT 42.1 41.4  PLT 233 211   BMET  Recent Labs  12/06/15 1713 12/07/15 0325  NA 142 140  K 4.3 4.5  CL 103 110  CO2 30 22  GLUCOSE 117* 158*  BUN 19 21*  CREATININE 1.11 1.06  CALCIUM 9.5 8.5*     Studies/Results: Ct Abdomen Pelvis W Contrast  12/07/2015  CLINICAL DATA:  History of perforated diverticulitis, and recurrent small bowel obstruction. Now with LEFT abdominal pain and swelling for 2 days, symptoms similar to prior bouts of diverticulitis. History of partial colectomy, small bowel repair. EXAM: CT ABDOMEN AND PELVIS WITH CONTRAST TECHNIQUE: Multidetector CT imaging of the abdomen and pelvis was performed using the standard protocol following bolus administration of intravenous contrast. CONTRAST:  168mL OMNIPAQUE IOHEXOL 300 MG/ML  SOLN COMPARISON:  CT abdomen and pelvis September 16, 2015 and abdominal radiograph December 06, 2015 at 1611 hours FINDINGS: LUNG BASES: Included view of the lung bases are clear. Visualized heart and pericardium are unremarkable. SOLID ORGANS: The liver, spleen, gallbladder, pancreas and adrenal glands are  unremarkable. GASTROINTESTINAL TRACT: Recurrent small bowel obstruction, fluid filled small bowel measures up to 3.5 cm with multiple transition points in the LEFT lower quadrant; status post LEFT abdominal herniorrhaphy with mesh. Loops of small bowel appear adherent to the anterior abdominal wall. Large bowel is decompressed. Moderate predominately descending colonic diverticulosis without superimposed acute diverticulitis. Normal appendix. KIDNEYS/ URINARY TRACT: Horseshoe kidney demonstrating symmetric enhancement. No nephrolithiasis, hydronephrosis or solid renal masses. Too small to characterize hypodensity in bilateral renal moieties . The unopacified ureters are normal in course and caliber. Delayed imaging through the kidneys demonstrates symmetric prompt contrast excretion within the proximal urinary collecting system. Urinary bladder is partially distended and unremarkable. PERITONEUM/RETROPERITONEUM: Aortoiliac vessels are normal in course and caliber ; severe calcific atherosclerosis nearly effaces the infrarenal aortic lumen, unchanged. No lymphadenopathy by CT size criteria. Prostate size is normal. No intraperitoneal free fluid nor free air. SOFT TISSUE/OSSEOUS STRUCTURES: Non-suspicious. Moderate fat containing inguinal hernias. S-type scoliosis. Moderate to severe degenerative change of lumbar spine. IMPRESSION: Recurrent partial versus early small bowel obstruction with transition points in LEFT lower quadrant, similar in distribution to prior CT, likely from adhesions. Diverticulosis without CT findings of acute diverticulitis. Severe calcific atherosclerosis results in probable high-grade stenosis infrarenal aortic, stable from prior imaging. Electronically Signed   By: Elon Alas M.D.   On: 12/07/2015 00:17   Dg Abd Acute W/chest  12/06/2015  CLINICAL DATA:  Lower abdominal pain for 2 days EXAM: DG ABDOMEN ACUTE W/ 1V CHEST COMPARISON:  Multiple prior studies FINDINGS: Chronic scarring  lateral  left lung base. Stable mild cardiac enlargement compared to 2012. Stable aortic calcification. No free air. Air-fluid level within central abdominal small bowel loops showing minimal dilatation. Relatively little gas seen in the abdomen otherwise with some gas in stomach and transverse colon. IMPRESSION: Consistent with this. Small bowel obstruction, minimally dilated loop of small bowel central abdomen with otherwise paucity of abdominal gas concerning for recurrent small bowel obstruction. Electronically Signed   By: Skipper Cliche M.D.   On: 12/06/2015 18:35   Abdominal x-ray today not yet read but shows contrast in the colon and improved small bowel gas by my reading  Anti-infectives: Anti-infectives    None      Assessment/Plan: Recurrent small bowel obstruction. Clinically resolved. DC NG tube and start clear liquid diet. Probably home in a.m. Consider elective lysis of adhesions     LOS: 2 days    Johanna Stafford T 12/08/2015

## 2015-12-09 NOTE — Discharge Instructions (Signed)
Low-Fiber Diet °Fiber is found in fruits, vegetables, and whole grains. A low-fiber diet restricts fibrous foods that are not digested in the small intestine. A diet containing about 10-15 grams of fiber per day is considered low fiber. Low-fiber diets may be used to: °· Promote healing and rest the bowel during intestinal flare-ups. °· Prevent blockage of a partially obstructed or narrowed gastrointestinal tract. °· Reduce fecal weight and volume. °· Slow the movement of feces. °You may be on a low-fiber diet as a transitional diet following surgery, after an injury (trauma), or because of a short (acute) or lifelong (chronic) illness. Your health care provider will determine the length of time you need to stay on this diet.  °WHAT DO I NEED TO KNOW ABOUT A LOW-FIBER DIET? °Always check the fiber content on the packaging's Nutrition Facts label, especially on foods from the grains list. Ask your dietitian if you have questions about specific foods that are related to your condition, especially if the food is not listed below. In general, a low-fiber food will have less than 2 g of fiber. °WHAT FOODS CAN I EAT? °Grains °All breads and crackers made with white flour. Sweet rolls, doughnuts, waffles, pancakes, French toast, bagels. Pretzels, Melba toast, zwieback. Well-cooked cereals, such as cornmeal, farina, or cream cereals. Dry cereals that do not contain whole grains, fruit, or nuts, such as refined corn, wheat, rice, and oat cereals. Potatoes prepared any way without skins, plain pastas and noodles, refined white rice. Use white flour for baking and making sauces. Use allowed list of grains for casseroles, dumplings, and puddings.  °Vegetables °Strained tomato and vegetable juices. Fresh lettuce, cucumber, spinach. Well-cooked (no skin or pulp) or canned vegetables, such as asparagus, bean sprouts, beets, carrots, green beans, mushrooms, potatoes, pumpkin, spinach, yellow squash, tomato sauce/puree, turnips,  yams, and zucchini. Keep servings limited to ½ cup.  °Fruits °All fruit juices except prune juice. Cooked or canned fruits without skin and seeds, such as applesauce, apricots, cherries, fruit cocktail, grapefruit, grapes, mandarin oranges, melons, peaches, pears, pineapple, and plums. Fresh fruits without skin, such as apricots, avocados, bananas, melons, pineapple, nectarines, and peaches. Keep servings limited to ½ cup or 1 piece.   °Meat and Other Protein Sources °Ground or well-cooked tender beef, ham, veal, lamb, pork, or poultry. Eggs, plain cheese. Fish, oysters, shrimp, lobster, and other seafood. Liver, organ meats. Smooth nut butters. °Dairy °All milk products and alternative dairy substitutes, such as soy, rice, almond, and coconut, not containing added whole nuts, seeds, or added fruit. °Beverages °Decaf coffee, fruit, and vegetable juices or smoothies (small amounts, with no pulp or skins, and with fruits from allowed list), sports drinks, herbal tea. °Condiments °Ketchup, mustard, vinegar, cream sauce, cheese sauce, cocoa powder. Spices in moderation, such as allspice, basil, bay leaves, celery powder or leaves, cinnamon, cumin powder, curry powder, ginger, mace, marjoram, onion or garlic powder, oregano, paprika, parsley flakes, ground pepper, rosemary, sage, savory, tarragon, thyme, and turmeric. °Sweets and Desserts °Plain cakes and cookies, pie made with allowed fruit, pudding, custard, cream pie. Gelatin, fruit, ice, sherbet, frozen ice pops. Ice cream, ice milk without nuts. Plain hard candy, honey, jelly, molasses, syrup, sugar, chocolate syrup, gumdrops, marshmallows. Limit overall sugar intake.  °Fats and Oil °Margarine, butter, cream, mayonnaise, salad oils, plain salad dressings made from allowed foods. Choose healthy fats such as olive oil, canola oil, and omega-3 fatty acids (such as found in salmon or tuna) when possible.  °Other °Bouillon, broth, or cream soups made   from allowed foods.  Any strained soup. Casseroles or mixed dishes made with allowed foods. °The items listed above may not be a complete list of recommended foods or beverages. Contact your dietitian for more options.  °WHAT FOODS ARE NOT RECOMMENDED? °Grains °All whole wheat and whole grain breads and crackers. Multigrains, rye, bran seeds, nuts, or coconut. Cereals containing whole grains, multigrains, bran, coconut, nuts, raisins. Cooked or dry oatmeal, steel-cut oats. Coarse wheat cereals, granola. Cereals advertised as high fiber. Potato skins. Whole grain pasta, wild or brown rice. Popcorn. Coconut flour. Bran, buckwheat, corn bread, multigrains, rye, wheat germ.  °Vegetables °Fresh, cooked or canned vegetables, such as artichokes, asparagus, beet greens, broccoli, Brussels sprouts, cabbage, celery, cauliflower, corn, eggplant, kale, legumes or beans, okra, peas, and tomatoes. Avoid large servings of any vegetables, especially raw vegetables.  °Fruits °Fresh fruits, such as apples with or without skin, berries, cherries, figs, grapes, grapefruit, guavas, kiwis, mangoes, oranges, papayas, pears, persimmons, pineapple, and pomegranate. Prune juice and juices with pulp, stewed or dried prunes. Dried fruits, dates, raisins. Fruit seeds or skins. Avoid large servings of all fresh fruits. °Meats and Other Protein Sources °Tough, fibrous meats with gristle. Chunky nut butter. Cheese made with seeds, nuts, or other foods not recommended. Nuts, seeds, legumes (beans, including baked beans), dried peas, beans, lentils.  °Dairy °Yogurt or cheese that contains nuts, seeds, or added fruit.  °Beverages °Fruit juices with high pulp, prune juice. Caffeinated coffee and teas.  °Condiments °Coconut, maple syrup, pickles, olives. °Sweets and Desserts °Desserts, cookies, or candies that contain nuts or coconut, chunky peanut butter, dried fruits. Jams, preserves with seeds, marmalade. Large amounts of sugar and sweets. Any other dessert made with  fruits from the not recommended list.  °Other °Soups made from vegetables that are not recommended or that contain other foods not recommended.  °The items listed above may not be a complete list of foods and beverages to avoid. Contact your dietitian for more information. °  °This information is not intended to replace advice given to you by your health care provider. Make sure you discuss any questions you have with your health care provider. °  °Document Released: 05/08/2002 Document Revised: 11/21/2013 Document Reviewed: 10/09/2013 °[employer] Interactive Patient Education ©2016 [employer] ° °

## 2015-12-09 NOTE — Discharge Summary (Signed)
Patient ID: Matthew Hodges MRN: [id number] DOB/AGE: 1941-05-19 75 y.o.  Admit date: 12/06/2015 Discharge date: 12/09/2015  Procedures: none  Consults: None  Reason for Admission:  Patient is a 75 year old male with extensive history of small bowel obstruction. He most recently had a hernia repair in May 2016 with Dr. Johney Maine. This is associated with 2 hour lysis of adhesions. He was doing fine until today after lunch. He started feeling the usual abdominal pain, on. By a few more hours and was excruciating and he got to the hospital around 4:00. He has been admitted multiple times for bowel obstructions that resolved reasonably quickly with NG tube decompression. However, he continues to bounce back and short anterolateral walls. His last admission to [facility] was in October. He has not noted any difference in the pain when he is mobile. His last BM was this morning, but he has not passed gas all day.    Admission Diagnoses:  1. pSBO  Hospital Course: The patient was admitted and an NGT was placed.  The following day he was already passing flatus and felt much better.  It was left for the day and then removed on HD2.  He was started on clear liquids and his diet was advanced as tolerated to a low fiber/soft diet.  He was stable for dc on HD 3 with follow up with Dr. Johney Maine to discuss elective LOA.  PE: Abd: soft, NT, ND, +BS   Discharge Diagnoses:  Active Problems:   Small bowel obstruction (HCC) resovled  Discharge Medications:   Medication List    TAKE these medications        aspirin EC 81 MG tablet  Take 81 mg by mouth every morning.     atorvastatin 40 MG tablet  Commonly known as:  LIPITOR  Take 20 mg by mouth at bedtime.     ibuprofen 200 MG tablet  Commonly known as:  ADVIL,MOTRIN  Take 400 mg by mouth every 6 (six) hours as needed for headache or moderate pain.     MEGA MULTIVITAMIN FOR MEN PO  Take 1 tablet by mouth every morning.     mirabegron ER 25 MG  Tb24 tablet  Commonly known as:  MYRBETRIQ  Take 25 mg by mouth every morning.     omega-3 acid ethyl esters 1 g capsule  Commonly known as:  LOVAZA  Take 1 g by mouth every morning.     sildenafil 100 MG tablet  Commonly known as:  VIAGRA  Take 100 mg by mouth daily as needed for erectile dysfunction.     Vitamin D3 400 units tablet  Take 400 Units by mouth every morning.        Discharge Instructions:     Follow-up Information    Follow up with GROSS,STEVEN C., MD. Schedule an appointment as soon as possible for a visit in 3 weeks.   Specialty:  General Surgery   Contact information:   [address] [place] [address] [phone]       Signed: Henreitta Cea 12/09/2015, 10:14 AM

## 2015-12-09 NOTE — Care Management Note (Signed)
Case Management Note  Patient Details  Name: Matthew Hodges MRN: [id number] Date of Birth: 01-07-1941  Subjective/Objective:                    Action/Plan:  UR completed  Expected Discharge Date:                  Expected Discharge Plan:  Home/Self Care  In-House Referral:     Discharge planning Services     Post Acute Care Choice:    Choice offered to:     DME Arranged:    DME Agency:     HH Arranged:    [place] Agency:     Status of Service:  Completed, signed off  Medicare Important Message Given:    Date Medicare IM Given:    Medicare IM give by:    Date Additional Medicare IM Given:    Additional Medicare Important Message give by:     If discussed at [place] of Stay Meetings, dates discussed:    Additional Comments:  Marilu Favre, RN 12/09/2015, 1:29 PM

## 2015-12-09 NOTE — Progress Notes (Signed)
Pt discharged to home.  Discharge instructions explained to pt.  Pt has no questions at the time of discharge.  Pt states he has all belongings.  IV removed.  Pt ambulated off unit on his own.   

## 2015-12-09 NOTE — Care Management Important Message (Signed)
Important Message  Patient Details  Name: Matthew Hodges MRN: [id number] Date of Birth: [birth year]   Medicare Important Message Given:  Yes    Louanne Belton 12/09/2015, 2:14 [place] Message  Patient Details  Name: Matthew Hodges MRN: [id number] Date of Birth: [birth year]   Medicare Important Message Given:  Yes    Jayna Mulnix G 12/09/2015, 2:14 PM

## 2016-02-04 ENCOUNTER — Emergency Department (HOSPITAL_COMMUNITY): Payer: Medicare Other

## 2016-02-04 ENCOUNTER — Encounter (HOSPITAL_COMMUNITY): Payer: Self-pay

## 2016-02-04 ENCOUNTER — Observation Stay (HOSPITAL_COMMUNITY)
Admission: EM | Admit: 2016-02-04 | Discharge: 2016-02-05 | Disposition: A | Discharge status: Home / Self Care | Payer: Medicare Other | Attending: General Surgery | Admitting: General Surgery

## 2016-02-04 DIAGNOSIS — Z91040 Latex allergy status: Secondary | ICD-10-CM | POA: Insufficient documentation

## 2016-02-04 DIAGNOSIS — K56609 Unspecified intestinal obstruction, unspecified as to partial versus complete obstruction: Secondary | ICD-10-CM

## 2016-02-04 DIAGNOSIS — R109 Unspecified abdominal pain: Secondary | ICD-10-CM | POA: Diagnosis present

## 2016-02-04 DIAGNOSIS — E785 Hyperlipidemia, unspecified: Secondary | ICD-10-CM | POA: Insufficient documentation

## 2016-02-04 DIAGNOSIS — K5669 Other intestinal obstruction: Secondary | ICD-10-CM | POA: Diagnosis present

## 2016-02-04 DIAGNOSIS — R739 Hyperglycemia, unspecified: Secondary | ICD-10-CM | POA: Diagnosis not present

## 2016-02-04 DIAGNOSIS — Z9049 Acquired absence of other specified parts of digestive tract: Secondary | ICD-10-CM | POA: Diagnosis not present

## 2016-02-04 DIAGNOSIS — Z85828 Personal history of other malignant neoplasm of skin: Secondary | ICD-10-CM | POA: Diagnosis not present

## 2016-02-04 DIAGNOSIS — R112 Nausea with vomiting, unspecified: Secondary | ICD-10-CM

## 2016-02-04 DIAGNOSIS — Z8673 Personal history of transient ischemic attack (TIA), and cerebral infarction without residual deficits: Secondary | ICD-10-CM | POA: Diagnosis not present

## 2016-02-04 DIAGNOSIS — R918 Other nonspecific abnormal finding of lung field: Secondary | ICD-10-CM | POA: Insufficient documentation

## 2016-02-04 DIAGNOSIS — I700 Atherosclerosis of aorta: Secondary | ICD-10-CM | POA: Insufficient documentation

## 2016-02-04 DIAGNOSIS — Z7982 Long term (current) use of aspirin: Secondary | ICD-10-CM | POA: Diagnosis not present

## 2016-02-04 DIAGNOSIS — I4510 Unspecified right bundle-branch block: Secondary | ICD-10-CM

## 2016-02-04 DIAGNOSIS — Z8719 Personal history of other diseases of the digestive system: Secondary | ICD-10-CM | POA: Insufficient documentation

## 2016-02-04 DIAGNOSIS — Z8546 Personal history of malignant neoplasm of prostate: Secondary | ICD-10-CM | POA: Insufficient documentation

## 2016-02-04 DIAGNOSIS — Z9889 Other specified postprocedural states: Secondary | ICD-10-CM | POA: Diagnosis not present

## 2016-02-04 DIAGNOSIS — I10 Essential (primary) hypertension: Secondary | ICD-10-CM | POA: Insufficient documentation

## 2016-02-04 DIAGNOSIS — R1030 Lower abdominal pain, unspecified: Secondary | ICD-10-CM

## 2016-02-04 DIAGNOSIS — D72829 Elevated white blood cell count, unspecified: Secondary | ICD-10-CM

## 2016-02-04 DIAGNOSIS — Z87891 Personal history of nicotine dependence: Secondary | ICD-10-CM | POA: Diagnosis not present

## 2016-02-04 LAB — URINALYSIS, ROUTINE W REFLEX MICROSCOPIC
GLUCOSE, UA: NEGATIVE mg/dL
HGB URINE DIPSTICK: NEGATIVE
KETONES UR: 15 mg/dL — AB
Leukocytes, UA: NEGATIVE
Nitrite: NEGATIVE
PROTEIN: NEGATIVE mg/dL
Specific Gravity, Urine: 1.029 (ref 1.005–1.030)
pH: 5 (ref 5.0–8.0)

## 2016-02-04 LAB — COMPREHENSIVE METABOLIC PANEL
ALBUMIN: 4 g/dL (ref 3.5–5.0)
ALK PHOS: 71 U/L (ref 38–126)
ALT: 27 U/L (ref 17–63)
AST: 28 U/L (ref 15–41)
Anion gap: 15 (ref 5–15)
BILIRUBIN TOTAL: 0.8 mg/dL (ref 0.3–1.2)
BUN: 17 mg/dL (ref 6–20)
CO2: 24 mmol/L (ref 22–32)
CREATININE: 1.19 mg/dL (ref 0.61–1.24)
Calcium: 9.8 mg/dL (ref 8.9–10.3)
Chloride: 100 mmol/L — ABNORMAL LOW (ref 101–111)
GFR calc Af Amer: >60 mL/min (ref >60)
GFR, EST NON AFRICAN AMERICAN: 58 mL/min — AB (ref >60)
GLUCOSE: 180 mg/dL — AB (ref 65–99)
Potassium: 4.9 mmol/L (ref 3.5–5.1)
Sodium: 139 mmol/L (ref 135–145)
TOTAL PROTEIN: 7 g/dL (ref 6.5–8.1)

## 2016-02-04 LAB — CBC WITH DIFFERENTIAL/PLATELET
BASOS ABS: 0 10*3/uL (ref 0.0–0.1)
BASOS PCT: 0 %
EOS ABS: 0 10*3/uL (ref 0.0–0.7)
EOS PCT: 0 %
HCT: 43.7 % (ref 39.0–52.0)
Hemoglobin: 14.9 g/dL (ref 13.0–17.0)
Lymphocytes Relative: 8 %
Lymphs Abs: 0.9 10*3/uL (ref 0.7–4.0)
MCH: 31.3 pg (ref 26.0–34.0)
MCHC: 34.1 g/dL (ref 30.0–36.0)
MCV: 91.8 fL (ref 78.0–100.0)
MONO ABS: 0.5 10*3/uL (ref 0.1–1.0)
Monocytes Relative: 4 %
NEUTROS ABS: 10.1 10*3/uL — AB (ref 1.7–7.7)
Neutrophils Relative %: 88 %
PLATELETS: 187 10*3/uL (ref 150–400)
RBC: 4.76 MIL/uL (ref 4.22–5.81)
RDW: 13.1 % (ref 11.5–15.5)
WBC: 11.6 10*3/uL — ABNORMAL HIGH (ref 4.0–10.5)

## 2016-02-04 LAB — I-STAT TROPONIN, ED: TROPONIN I, POC: 0 ng/mL (ref 0.00–0.08)

## 2016-02-04 LAB — LIPASE, BLOOD: Lipase: 35 U/L (ref 11–51)

## 2016-02-04 MED ORDER — ONDANSETRON HCL 4 MG/2ML IJ SOLN: 4.0000 mg | Freq: Four times a day (QID) | INTRAMUSCULAR | Status: DC | PRN

## 2016-02-04 MED ORDER — DIPHENHYDRAMINE HCL 50 MG/ML IJ SOLN: 12.5000 mg | Freq: Four times a day (QID) | INTRAMUSCULAR | Status: DC | PRN

## 2016-02-04 MED ORDER — ONDANSETRON HCL 4 MG/2ML IJ SOLN
4.0000 mg | Freq: Once | INTRAMUSCULAR | Status: AC
Start: 2016-02-04 — End: 2016-02-04
  Administered 2016-02-04: 4 mg via INTRAVENOUS
  Filled 2016-02-04: qty 2

## 2016-02-04 MED ORDER — HYDRALAZINE HCL 20 MG/ML IJ SOLN: 10.0000 mg | INTRAMUSCULAR | Status: DC | PRN

## 2016-02-04 MED ORDER — SODIUM CHLORIDE 0.9 % IV BOLUS (SEPSIS)
1000.0000 mL | Freq: Once | INTRAVENOUS | Status: AC
  Administered 2016-02-04: 1000 mL via INTRAVENOUS

## 2016-02-04 MED ORDER — MORPHINE SULFATE (PF) 4 MG/ML IV SOLN
4.0000 mg | Freq: Once | INTRAVENOUS | Status: AC
  Administered 2016-02-04: 4 mg via INTRAVENOUS
  Filled 2016-02-04: qty 1

## 2016-02-04 MED ORDER — MORPHINE SULFATE (PF) 2 MG/ML IV SOLN: 1.0000 mg | INTRAVENOUS | Status: DC | PRN

## 2016-02-04 MED ORDER — ACETAMINOPHEN 650 MG RE SUPP: 650.0000 mg | Freq: Four times a day (QID) | RECTAL | Status: DC | PRN

## 2016-02-04 MED ORDER — SIMETHICONE 80 MG PO CHEW: 40.0000 mg | CHEWABLE_TABLET | Freq: Four times a day (QID) | ORAL | Status: DC | PRN

## 2016-02-04 MED ORDER — HYDROCODONE-ACETAMINOPHEN 5-325 MG PO TABS: 1.0000 | ORAL_TABLET | ORAL | Status: DC | PRN

## 2016-02-04 MED ORDER — ACETAMINOPHEN 325 MG PO TABS: 650.0000 mg | ORAL_TABLET | Freq: Four times a day (QID) | ORAL | Status: DC | PRN

## 2016-02-04 MED ORDER — POTASSIUM CHLORIDE IN NACL 20-0.9 MEQ/L-% IV SOLN
INTRAVENOUS | Status: DC
  Administered 2016-02-04: 100 mL/h via INTRAVENOUS
  Administered 2016-02-05: 1000 mL via INTRAVENOUS
  Filled 2016-02-04 (×4): qty 1000

## 2016-02-04 MED ORDER — DOCUSATE SODIUM 100 MG PO CAPS: 100.0000 mg | ORAL_CAPSULE | Freq: Two times a day (BID) | ORAL | Status: DC | PRN

## 2016-02-04 MED ORDER — DIPHENHYDRAMINE HCL 12.5 MG/5ML PO ELIX
12.5000 mg | ORAL_SOLUTION | Freq: Four times a day (QID) | ORAL | Status: DC | PRN
Start: 2016-02-04 — End: 2016-02-05
  Administered 2016-02-04: 12.5 mg via ORAL
  Filled 2016-02-04: qty 10

## 2016-02-04 MED ORDER — ONDANSETRON 4 MG PO TBDP: 4.0000 mg | ORAL_TABLET | Freq: Four times a day (QID) | ORAL | Status: DC | PRN

## 2016-02-04 MED ORDER — IOHEXOL 300 MG/ML  SOLN
100.0000 mL | Freq: Once | INTRAMUSCULAR | Status: AC | PRN
  Administered 2016-02-04: 100 mL via INTRAVENOUS

## 2016-02-04 MED ORDER — INSULIN ASPART 100 UNIT/ML ~~LOC~~ SOLN: 0.0000 [IU] | Freq: Three times a day (TID) | SUBCUTANEOUS | Status: DC

## 2016-02-04 NOTE — ED Notes (Signed)
Report attempted 

## 2016-02-04 NOTE — Progress Notes (Signed)
Patient arrived around 2000 from ED as observation for abdominal pain, patient with recent and multiple abdominal surgeries and conditions, is alert and no complaints of pain; abdomin is distended but not taught. Patient is refusing CBG's in ED and any vital sign assessments through out the night. He states that he does not want to be woken up during the night. Will continue to monitor.

## 2016-02-04 NOTE — ED Notes (Signed)
Returns from CT at this time 

## 2016-02-04 NOTE — Progress Notes (Signed)
Patient refuses to ambulate or allow vital signs and blood sugar checks to be taken he also refuses SCD's, will continue to monitor.

## 2016-02-04 NOTE — ED Notes (Signed)
Pt comes from home via [facility], c/o abd pain that started around 1 am , pt has hx of obstruction x 3 , n/v today, last BM today around 1am.

## 2016-02-04 NOTE — ED Notes (Signed)
PT reports no pain or nausea at this time.

## 2016-02-04 NOTE — ED Provider Notes (Signed)
CSN: [id number]     Arrival date & time 02/04/16  [id number] History   First MD Initiated Contact with Patient 02/04/16 [phone]     Chief Complaint  Patient presents with  . Abdominal Pain     (Consider location/radiation/quality/duration/timing/severity/associated sxs/prior Treatment) HPI Comments: Matthew Hodges is a 75 y.o. male with a PMHx of cerebral artery disease/CVA, diverticular disease, HTN, HLD, prostate cancer, basal cell carcinoma, and multiple SBOs, with a PSHx of partial colectomy, small bowel repair for postop abscesses and fistula, incisional hernia repair w/mesh, and most recently ventral hernia repair and lysis of adhesions in 03/2015 by Dr. Johney Maine, who presents to the ED with complaints of sudden onset abdominal pain began 6 hours prior to arrival. He states this feels very similar to his prior small bowel obstructions. He describes the pain as 10/10 constant waxing and waning cramping pain in the lower abdomen, nonradiating, with no known aggravating or alleviating factors given that he has not tried anything prior to arrival. Associated symptoms include nausea, 4-5 episodes of nonbloody nonbilious emesis, decreased flatus since onset, and abdominal distention. His last meal was spaghetti, salad, and apple crisp for dessert around 7 PM.  He denies any fevers, chills, chest pain, shortness breath, diarrhea, constipation, hematochezia, Elinor, hematemesis, dysuria, hematuria, numbness, tingling, or weakness. He denies any recent travel, sick contacts, suspicious food intake, alcohol use, or chronic NSAID use. His last bowel movement was approximately 2-3 hours prior to arrival.  Patient is a 75 y.o. male presenting with abdominal pain. The history is provided by the patient and medical records. No language interpreter was used.  Abdominal Pain Pain location:  LLQ, RLQ and suprapubic Pain quality: cramping   Pain radiates to:  Does not radiate Pain severity:  Severe Onset quality:   Sudden Duration:  6 hours Timing:  Constant Progression:  Waxing and waning Chronicity:  Recurrent Context: previous surgery   Context: not recent travel, not sick contacts and not suspicious food intake   Relieved by:  None tried Worsened by:  Nothing tried Ineffective treatments:  None tried Associated symptoms: flatus, nausea and vomiting   Associated symptoms: no chest pain, no chills, no constipation, no diarrhea, no dysuria, no fever, no hematemesis, no hematochezia, no hematuria, no melena and no shortness of breath   Risk factors: multiple surgeries   Risk factors: no alcohol abuse and no NSAID use     Past Medical History  Diagnosis Date  . Cerebral artery disease   . Diverticular disease   . Hyperlipidemia   . Hypertension     hx. of . no meds since 2004  . Prostate cancer [facility])     treated with radiation  . Basal cell carcinoma     "sqattered"  . Squamous carcinoma ([place])     "sqattered"  . Heart murmur     "after surgeries before"  . Stroke [facility]) 1979    no deficits   Past Surgical History  Procedure Laterality Date  . Partial colectomy  01/1999    Hartmans procedure with colostomy  . Small bowel repair  03/1999    postop abscesses with fistula.  Abscesses drained.  Fistula repaired.  . Colostomy takedown  08/1999  . Incisional hernia repair  2001    open w mesh  . Laparoscopic incisional / umbilical / ventral hernia repair  2008    with mesh.  LLQ old colostomy site  . Remove suture under anesthesia  2011    stitch abscesses LLQ &  midline  . Ventral hernia repair N/A 04/23/2015    Procedure: LAPAROSCOPIC VENTRAL WALL HERNIA REPAIR AND LAP LYSIS OF ADHESIONS;  Surgeon: Michael Boston, MD;  Location: [facility] ORS;  Service: General;  Laterality: N/A;  With MESH  . Lysis of adhesion N/A 04/23/2015    Procedure: LYSIS OF ADHESION;  Surgeon: Michael Boston, MD;  Location: [facility]S;  Service: General;  Laterality: N/A;  . Colon surgery    . Hernia repair      "lots; had  diverticulitis years ago"  . Prostate biopsy  ~ 2002   No family history on file. Social History  Substance Use Topics  . Smoking status: Former Smoker -- 2.00 packs/day for 47 years    Types: Cigarettes    Quit date: 11/30/2004  . Smokeless tobacco: Never Used  . Alcohol Use: Yes     Comment: recovering alcoholic sober since [id number]    Review of Systems  Constitutional: Negative for fever and chills.  Respiratory: Negative for shortness of breath.   Cardiovascular: Negative for chest pain.  Gastrointestinal: Positive for nausea, vomiting, abdominal pain, abdominal distention and flatus. Negative for diarrhea, constipation, blood in stool, melena, hematochezia and hematemesis.       +obstipation/no flatus since onset  Genitourinary: Negative for dysuria and hematuria.  Musculoskeletal: Negative for myalgias and arthralgias.  Skin: Negative for color change.  Allergic/Immunologic: Negative for immunocompromised state.  Neurological: Negative for weakness and numbness.  Psychiatric/Behavioral: Negative for confusion.   10 Systems reviewed and are negative for acute change except as noted in the HPI.    Allergies  Latex  Home Medications   Prior to Admission medications   Medication Sig Start Date End Date Taking? Authorizing Provider  aspirin EC 81 MG tablet Take 81 mg by mouth every morning.     Historical Provider, MD  atorvastatin (LIPITOR) 40 MG tablet Take 20 mg by mouth at bedtime.    Historical Provider, MD  Cholecalciferol (VITAMIN D3) 400 UNITS tablet Take 400 Units by mouth every morning.     Historical Provider, MD  ibuprofen (ADVIL,MOTRIN) 200 MG tablet Take 400 mg by mouth every 6 (six) hours as needed for headache or moderate pain.    Historical Provider, MD  mirabegron ER (MYRBETRIQ) 25 MG TB24 tablet Take 25 mg by mouth every morning.    Historical Provider, MD  Multiple Vitamins-Minerals (MEGA MULTIVITAMIN FOR MEN PO) Take 1 tablet by mouth every morning.      Historical Provider, MD  omega-3 acid ethyl esters (LOVAZA) 1 G capsule Take 1 g by mouth every morning.     Historical Provider, MD  sildenafil (VIAGRA) 100 MG tablet Take 100 mg by mouth daily as needed for erectile dysfunction.     Historical Provider, MD   BP 151/66 mmHg  Pulse 58  Temp(Src) 97.8 F (36.6 C) (Oral)  Ht 5\' 8"  (1.727 m)  Wt 88.451 kg  BMI 29.66 kg/m2  SpO2 98% Physical Exam  Constitutional: He is oriented to person, place, and time. Vital signs are normal. He appears well-developed and well-nourished.  Non-toxic appearance. He appears distressed (uncomfortable).  Afebrile, nontoxic, uncomfortable appearing  HENT:  Head: Normocephalic and atraumatic.  Mouth/Throat: Oropharynx is clear and moist and mucous membranes are normal.  Eyes: Conjunctivae and EOM are normal. Right eye exhibits no discharge. Left eye exhibits no discharge.  Neck: Normal range of motion. Neck supple.  Cardiovascular: Normal rate, regular rhythm, normal heart sounds and intact distal pulses.  Exam reveals no  gallop and no friction rub.   No murmur heard. Pulmonary/Chest: Effort normal and breath sounds normal. No respiratory distress. He has no decreased breath sounds. He has no wheezes. He has no rhonchi. He has no rales.  Abdominal: Soft. Normal appearance. He exhibits distension. Bowel sounds are decreased. There is tenderness in the periumbilical area, suprapubic area, left upper quadrant and left lower quadrant. There is guarding (voluntary). There is no rigidity, no rebound, no CVA tenderness, no tenderness at McBurney's point and negative Murphy's sign.  Soft, mildly distended, with hypoactive bowel sounds in the LLQ/LUQ and some bowel sounds noted in the RUQ/RLQ, with diffuse lower abd TTP with some slight LUQ/periumbilical TTP as well, mild voluntary guarding but no rebound or rigidity, neg murphy's, neg mcburney's, no CVA TTP   Musculoskeletal: Normal range of motion.  Neurological: He is  alert and oriented to person, place, and time. He has normal strength. No sensory deficit.  Skin: Skin is warm, dry and intact. No rash noted.  Psychiatric: He has a normal mood and affect.  Nursing note and vitals reviewed.   ED Course  Procedures (including critical care time) Labs Review Labs Reviewed  COMPREHENSIVE METABOLIC PANEL - Abnormal; Notable for the following:    Chloride 100 (*)    Glucose, Bld 180 (*)    GFR calc non Af Amer 58 (*)    All other components within normal limits  CBC WITH DIFFERENTIAL/PLATELET - Abnormal; Notable for the following:    WBC 11.6 (*)    Neutro Abs 10.1 (*)    All other components within normal limits  URINE CULTURE  LIPASE, BLOOD  URINALYSIS, ROUTINE W REFLEX MICROSCOPIC (NOT AT [facility])  I-STAT TROPOININ, ED    Imaging Review Ct Abdomen Pelvis W Contrast  02/04/2016  CLINICAL DATA:  Low abdominal pain today with nausea and vomiting. History of small bowel obstruction and multiple abdominal surgeries. EXAM: CT ABDOMEN AND PELVIS WITH CONTRAST TECHNIQUE: Multidetector CT imaging of the abdomen and pelvis was performed using the standard protocol following bolus administration of intravenous contrast. CONTRAST:  179mL OMNIPAQUE IOHEXOL 300 MG/ML  SOLN COMPARISON:  Radiographs today.  CT 12/06/2015) FINDINGS: Lower chest: Clear lung bases. No significant pleural or pericardial effusion. Hepatobiliary: The liver is normal in density without focal abnormality. No evidence of gallstones, gallbladder wall thickening or biliary dilatation. Pancreas: Unremarkable. No pancreatic ductal dilatation or surrounding inflammatory changes. Spleen: Normal in size without focal abnormality. Adrenals/Urinary Tract: Both adrenal glands appear normal. Horseshoe kidney again demonstrated with probable small cysts bilaterally. No evidence of urinary tract calculus or hydronephrosis. The bladder is decompressed without definite abnormality. Stomach/Bowel: No high-grade bowel  obstruction identified. There is a mildly dilated loop of small bowel posterior to the anterior abdominal wall incision, measuring up to 4.2 cm in diameter. This demonstrates an associated small bowel feces sign. As before, the small bowel loops may be adherent in this area, but there is no bowel wall thickening or definite incarceration. No other significant small bowel distention identified. The colon is decompressed. The appendix appears normal. There is no extraluminal fluid collection or focal inflammatory change . Vascular/Lymphatic: There are no enlarged abdominal or pelvic lymph nodes. There is severe atherosclerosis of the aorta, its branches and the iliac arteries. No large vessel occlusion identified, although there is probable significant stenosis of the distal aorta. Reproductive: Unremarkable. Other: Postsurgical changes within the anterior abdominal wall with mild diastasis of the rectus abdominus muscles. No focal hernia or significant change identified.  Musculoskeletal: No acute or significant osseous findings. Stable mild scoliosis and spondylosis. IMPRESSION: 1. No recurrent high-grade small bowel obstruction identified. There is a single loop of mildly dilated small bowel deep to the anterior abdominal wall incision which contains a small bowel feces sign, potentially secondary to chronic partial obstruction. 2. No intra-abdominal inflammatory changes or extraluminal fluid collection. 3. Severe atherosclerosis with high-grade stenosis of the aortic lumen. 4. Stable postsurgical changes within the anterior abdominal wall without evidence of recurrent focal hernia. Electronically Signed   By: Richardean Sale M.D.   On: 02/04/2016 12:06   Dg Abd Acute W/chest  02/04/2016  CLINICAL DATA:  Low abdominal pain for 1 day. History of chronic small bowel obstruction with multiple surgeries. EXAM: DG ABDOMEN ACUTE W/ 1V CHEST COMPARISON:  Radiographs 12/06/2015 and 12/08/2015.  CT 12/06/2015. FINDINGS:  There are lower lung volumes with resulting mildly increased atelectasis at both lung bases. The heart size mediastinal contours are stable with prominent aortic atherosclerosis. There is no pleural effusion or pneumothorax. No significantly dilated loops of bowel are demonstrated. However, there are multiple air-fluid levels within the small bowel at different levels on the erect examination. There is gas and stool within the colon. There are postsurgical changes within the anterior abdominal wall. No evidence of free intraperitoneal air. IMPRESSION: Although nonspecific, given previous findings and history, the presence of multiple small bowel air-fluid levels at different levels may reflect recurrent low grade small bowel obstruction. No evidence of free intraperitoneal air. Mildly increased bibasilar atelectasis. Electronically Signed   By: Richardean Sale M.D.   On: 02/04/2016 08:35   I have personally reviewed and evaluated these images and lab results as part of my medical decision-making.   EKG Interpretation   Date/Time:  Tuesday February 04 2016 07:54:55 EST Ventricular Rate:  63 PR Interval:  157 QRS Duration: 142 QT Interval:  446 QTC Calculation: 457 R Axis:   -40 Text Interpretation:  Sinus rhythm Right bundle branch block Inferior  infarct, old No significant change since last tracing Confirmed by  [facility] MD, ERIN ([id number]) on 02/04/2016 8:06:56 AM      MDM   Final diagnoses:  Lower abdominal pain  Small bowel obstruction (HCC)  Nausea and vomiting in adult patient  Hyperglycemia, unspecified  RBBB (right bundle branch block)  Leukocytosis  HTN (hypertension), benign    75 y.o. male here with abdominal pain that began 6 hours prior to arrival, feels similar to his prior small bowel obstructions. He's had some nausea and vomiting, no passage of flatus since onset, and feels distended. On exam, abdominal distention with hypoactive bowel sounds, tenderness in the lower  abdomen more prominently than the upper abdomen with some guarding. Will obtain labs and acute abdominal series in order to quickly identify a recurrent obstruction, want to avoid CT imaging if possible given that he has had multiple CT scans, most recently in January, and this would take longer. We'll give pain medication, nausea medication, and fluids. Will reassess shortly.  9:12 AM Trop neg. EKG with RBBB and very similar findings to EKG in 03/2015 (outpatient appt). CBC w/diff showing mildly elevated WBC 11.6 with slight neutrophilic predominance. Lipase WNL. CMP showing mildly elevated gluc at 180 but no anion gap. Xray showing concerns for recurrent low-grade SBO. U/A not yet obtained, pt urinating to give specimen now. Will consult surgery to discuss case, given that it seems likely that he has a recurrent SBO based on exam/imaging. Pt feeling much better after morphine  and zofran. Case was discussed with my attending Dr. Billy Fischer who agrees with plan.   10:11 AM U/A with few ketones and small bili but otherwise negative, doubt UTI. Still awaiting return page from surgery, who was repaged about 10 mins ago. Will await their call back. Given the delay, don't want to hold anything else up, will proceed with CT imaging to confirm SBO/locate transition point.  10:23 AM Coralie Keens PA-C from [facility] calling back, will discuss case with Dr. Dalbert Batman and call me back shortly. States to leave the CT imaging order in there, since they may want it, but she will call me back shortly to give more definitive instructions. Will await call.   11:08 AM Coralie Keens PA-C from [facility] calling back, states they will come see pt but would like to wait for CT imaging to further define this SBO. They feel he would likely need to be a medicine admission due to his comorbidities, but they will come see him now and we will await CT results.   12:24 PM CT showing mildly dilated loop of small bowel deep to incision with contains  small bowel feces sign concerning for chronic partial obstruction. Updated Coralie Keens PA-C of [facility] who states she will review images and come see the pt shortly. I appreciate their consultation, will await further instruction. Pt comfortable at this time.   1:20 PM Megan Dort PA-C down to see pt, states they will admit to their service for overnight obs. Pt comfortable at this time. Orders placed by Surgery service. Please see their notes for further documentation of care.  BP 128/64 mmHg  Pulse 82  Temp(Src) 97.8 F (36.6 C) (Oral)  Resp 18  Ht 5\' 8"  (1.727 m)  Wt 88.451 kg  BMI 29.66 kg/m2  SpO2 96%  Meds ordered this encounter  Medications  . sodium chloride 0.9 % bolus 1,000 mL    Sig:   . morphine 4 MG/ML injection 4 mg    Sig:   . ondansetron (ZOFRAN) injection 4 mg    Sig:   . iohexol (OMNIPAQUE) 300 MG/ML solution 100 mL    Sig:      Dwanna Goshert Camprubi-Soms, PA-C 02/04/16 1321  Gareth Morgan, MD 02/05/16 2246

## 2016-02-04 NOTE — ED Notes (Signed)
Attempted report 

## 2016-02-04 NOTE — ED Notes (Signed)
RN went to check pt. CBG and pt. Denies being diabetic and is refusing his CBG at this time.

## 2016-02-04 NOTE — H&P (Signed)
[facility] Surgery Admission Note  Matthew Hodges Dec 21, 1940  [id number].    Requesting MD: Dr. Billy Fischer Chief Complaint/Reason for Consult: Abdominal pain, N/V  HPI:  75 y/o white male with a PMHx of CVA, diverticular disease, HTN, HLD, prostate cancer, BCC and multiple SBO, presents to [facility] with gradual onset of abdominal pain since 1am.  He is well known to our service due to significant PSH with Dr. Excell Seltzer and Dr. Johney Maine.  PSHx includes emergency sigmoid colectomy/Hartman for perforated diverticulitis in 2000. Complicated by wound infections and incisional hernias status post colostomy takedown 2000 and onlay repair with mesh 2001. 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 Developed incisional hernia at colostomy site which was laparoscopically repaired in 2008. Most recently ventral hernia repair and lysis of adhesions in 03/2015 by Dr. Johney Maine  He states the pain on arrival to the ED felt very similar to his prior SBO's. He describes the pain as 10/10 constant waxing and waning cramping pain in the lower abdomen, nonradiating, with no known precipitating or alleviating factors.  He had nausea, 4-5 episodes of nonbloody nonbilious emesis, diffuse pain, and abdominal distention. His last BM was this am and was smaller than normal, but normal consistency.  He passed flatus early this morning before the BM.  His last meal was spaghetti, salad, and apple crisp for dessert around 7 PM.  He denies any fevers/chills, CP/SOB, diarrhea, constipation, hematochezia, hematemesis, dysuria, hematuria. He denies any recent travel, sick contacts, unusual foods.    When I examined him in the ER his pain had essentially resolved.  No N/V, and his abdominal distension had improved.  He feels good and is hungry/thirsty.  He almost feels good enough to go home, but he's concerned the pain will come back and that his pain is only controlled due to administration of pain  meds.  ROS: All systems reviewed and otherwise negative except for as above  No family history on file.  Past Medical History  Diagnosis Date  . Cerebral artery disease   . Diverticular disease   . Hyperlipidemia   . Hypertension     hx. of . no meds since 2004  . Prostate cancer [facility])     treated with radiation  . Basal cell carcinoma     "sqattered"  . Squamous carcinoma ([place])     "sqattered"  . Heart murmur     "after surgeries before"  . Stroke [facility]) 1979    no deficits    Past Surgical History  Procedure Laterality Date  . Partial colectomy  01/1999    Hartmans procedure with colostomy  . Small bowel repair  03/1999    postop abscesses with fistula.  Abscesses drained.  Fistula repaired.  . Colostomy takedown  08/1999  . Incisional hernia repair  2001    open w mesh  . Laparoscopic incisional / umbilical / ventral hernia repair  2008    with mesh.  LLQ old colostomy site  . Remove suture under anesthesia  2011    stitch abscesses LLQ & midline  . Ventral hernia repair N/A 04/23/2015    Procedure: LAPAROSCOPIC VENTRAL WALL HERNIA REPAIR AND LAP LYSIS OF ADHESIONS;  Surgeon: Michael Boston, MD;  Location: [facility] ORS;  Service: General;  Laterality: N/A;  With MESH  . Lysis of adhesion N/A 04/23/2015    Procedure: LYSIS OF ADHESION;  Surgeon: Michael Boston, MD;  Location: [facility] ORS;  Service: General;  Laterality: N/A;  . Colon surgery    .  Hernia repair      "lots; had diverticulitis years ago"  . Prostate biopsy  ~ 2002    Social History:  reports that he quit smoking about 11 years ago. His smoking use included Cigarettes. He has a 94 pack-year smoking history. He has never used smokeless tobacco. He reports that he drinks alcohol. He reports that he does not use illicit drugs.  Allergies:  Allergies  Allergen Reactions  . Latex Rash     (Not in a hospital admission)  Blood pressure 130/71, pulse 80, temperature 97.8 F (36.6 C), temperature source Oral, resp. rate  16, height _0  (1.727 m), weight 88.451 kg (195 lb), SpO2 97 %. Physical Exam: General: pleasant, WD/WN white male who is laying in bed in NAD HEENT: head is normocephalic, atraumatic.  Sclera are noninjected.  PERRL.  Ears and nose without any masses or lesions.  Mouth is pink and moist Heart: regular, rate, and rhythm.  No obvious murmurs, gallops, or rubs noted.  Palpable pedal pulses bilaterally Lungs: CTAB, no wheezes, rhonchi, or rales noted.  Respiratory effort nonlabored Abd: protuberant, soft, distended, NT, +BS, no masses or organomegaly, multiple well healed incisions, but diastasis rectus noted MS: all 4 extremities are symmetrical with no cyanosis, clubbing, or edema. Skin: warm and dry with no masses, lesions, or rashes Psych: A&Ox3 with an appropriate affect.   Results for orders placed or performed during the hospital encounter of 02/04/16 (from the past 48 hour(s))  Lipase, blood     Status: None   Collection Time: 02/04/16  7:50 AM  Result Value Ref Range   Lipase 35 11 - 51 U/L  Comprehensive metabolic panel     Status: Abnormal   Collection Time: 02/04/16  7:50 AM  Result Value Ref Range   Sodium 139 135 - 145 mmol/L   Potassium 4.9 3.5 - 5.1 mmol/L   Chloride 100 (L) 101 - 111 mmol/L   CO2 24 22 - 32 mmol/L   Glucose, Bld 180 (H) 65 - 99 mg/dL   BUN 17 6 - 20 mg/dL   Creatinine, Ser 1.19 0.61 - 1.24 mg/dL   Calcium 9.8 8.9 - 10.3 mg/dL   Total Protein 7.0 6.5 - 8.1 g/dL   Albumin 4.0 3.5 - 5.0 g/dL   AST 28 15 - 41 U/L   ALT 27 17 - 63 U/L   Alkaline Phosphatase 71 38 - 126 U/L   Total Bilirubin 0.8 0.3 - 1.2 mg/dL   GFR calc non Af Amer 58 (L) >60 mL/min   GFR calc Af Amer >60 >60 mL/min    Comment: (NOTE) The eGFR has been calculated using the CKD EPI equation. This calculation has not been validated in all clinical situations. eGFR's persistently <60 mL/min signify possible Chronic Kidney Disease.    Anion gap 15 5 - 15  CBC with Differential      Status: Abnormal   Collection Time: 02/04/16  7:50 AM  Result Value Ref Range   WBC 11.6 (H) 4.0 - 10.5 K/uL   RBC 4.76 4.22 - 5.81 MIL/uL   Hemoglobin 14.9 13.0 - 17.0 g/dL   HCT 43.7 39.0 - 52.0 %   MCV 91.8 78.0 - 100.0 fL   MCH 31.3 26.0 - 34.0 pg   MCHC 34.1 30.0 - 36.0 g/dL   RDW 13.1 11.5 - 15.5 %   Platelets 187 150 - 400 K/uL   Neutrophils Relative % 88 %   Neutro Abs 10.1 (H) 1.7 -  7.7 K/uL   Lymphocytes Relative 8 %   Lymphs Abs 0.9 0.7 - 4.0 K/uL   Monocytes Relative 4 %   Monocytes Absolute 0.5 0.1 - 1.0 K/uL   Eosinophils Relative 0 %   Eosinophils Absolute 0.0 0.0 - 0.7 K/uL   Basophils Relative 0 %   Basophils Absolute 0.0 0.0 - 0.1 K/uL  I-stat troponin, ED     Status: None   Collection Time: 02/04/16  7:55 AM  Result Value Ref Range   Troponin i, poc 0.00 0.00 - 0.08 ng/mL   Comment 3            Comment: Due to the release kinetics of cTnI, a negative result within the first hours of the onset of symptoms does not rule out myocardial infarction with certainty. If myocardial infarction is still suspected, repeat the test at appropriate intervals.   Urinalysis, Routine w reflex microscopic (not at [facility])     Status: Abnormal   Collection Time: 02/04/16  9:39 AM  Result Value Ref Range   Color, Urine YELLOW YELLOW   APPearance CLEAR CLEAR   Specific Gravity, Urine 1.029 1.005 - 1.030   pH 5.0 5.0 - 8.0   Glucose, UA NEGATIVE NEGATIVE mg/dL   Hgb urine dipstick NEGATIVE NEGATIVE   Bilirubin Urine SMALL (A) NEGATIVE   Ketones, ur 15 (A) NEGATIVE mg/dL   Protein, ur NEGATIVE NEGATIVE mg/dL   Nitrite NEGATIVE NEGATIVE   Leukocytes, UA NEGATIVE NEGATIVE    Comment: MICROSCOPIC NOT DONE ON URINES WITH NEGATIVE PROTEIN, BLOOD, LEUKOCYTES, NITRITE, OR GLUCOSE <1000 mg/dL.   Ct Abdomen Pelvis W Contrast  02/04/2016  CLINICAL DATA:  Low abdominal pain today with nausea and vomiting. History of small bowel obstruction and multiple abdominal surgeries. EXAM: CT  ABDOMEN AND PELVIS WITH CONTRAST TECHNIQUE: Multidetector CT imaging of the abdomen and pelvis was performed using the standard protocol following bolus administration of intravenous contrast. CONTRAST:  19m OMNIPAQUE IOHEXOL 300 MG/ML  SOLN COMPARISON:  Radiographs today.  CT 12/06/2015) FINDINGS: Lower chest: Clear lung bases. No significant pleural or pericardial effusion. Hepatobiliary: The liver is normal in density without focal abnormality. No evidence of gallstones, gallbladder wall thickening or biliary dilatation. Pancreas: Unremarkable. No pancreatic ductal dilatation or surrounding inflammatory changes. Spleen: Normal in size without focal abnormality. Adrenals/Urinary Tract: Both adrenal glands appear normal. Horseshoe kidney again demonstrated with probable small cysts bilaterally. No evidence of urinary tract calculus or hydronephrosis. The bladder is decompressed without definite abnormality. Stomach/Bowel: No high-grade bowel obstruction identified. There is a mildly dilated loop of small bowel posterior to the anterior abdominal wall incision, measuring up to 4.2 cm in diameter. This demonstrates an associated small bowel feces sign. As before, the small bowel loops may be adherent in this area, but there is no bowel wall thickening or definite incarceration. No other significant small bowel distention identified. The colon is decompressed. The appendix appears normal. There is no extraluminal fluid collection or focal inflammatory change . Vascular/Lymphatic: There are no enlarged abdominal or pelvic lymph nodes. There is severe atherosclerosis of the aorta, its branches and the iliac arteries. No large vessel occlusion identified, although there is probable significant stenosis of the distal aorta. Reproductive: Unremarkable. Other: Postsurgical changes within the anterior abdominal wall with mild diastasis of the rectus abdominus muscles. No focal hernia or significant change identified.  Musculoskeletal: No acute or significant osseous findings. Stable mild scoliosis and spondylosis. IMPRESSION: 1. No recurrent high-grade small bowel obstruction identified. There is a  single loop of mildly dilated small bowel deep to the anterior abdominal wall incision which contains a small bowel feces sign, potentially secondary to chronic partial obstruction. 2. No intra-abdominal inflammatory changes or extraluminal fluid collection. 3. Severe atherosclerosis with high-grade stenosis of the aortic lumen. 4. Stable postsurgical changes within the anterior abdominal wall without evidence of recurrent focal hernia. Electronically Signed   By: Richardean Sale M.D.   On: 02/04/2016 12:06   Dg Abd Acute W/chest  02/04/2016  CLINICAL DATA:  Low abdominal pain for 1 day. History of chronic small bowel obstruction with multiple surgeries. EXAM: DG ABDOMEN ACUTE W/ 1V CHEST COMPARISON:  Radiographs 12/06/2015 and 12/08/2015.  CT 12/06/2015. FINDINGS: There are lower lung volumes with resulting mildly increased atelectasis at both lung bases. The heart size mediastinal contours are stable with prominent aortic atherosclerosis. There is no pleural effusion or pneumothorax. No significantly dilated loops of bowel are demonstrated. However, there are multiple air-fluid levels within the small bowel at different levels on the erect examination. There is gas and stool within the colon. There are postsurgical changes within the anterior abdominal wall. No evidence of free intraperitoneal air. IMPRESSION: Although nonspecific, given previous findings and history, the presence of multiple small bowel air-fluid levels at different levels may reflect recurrent low grade small bowel obstruction. No evidence of free intraperitoneal air. Mildly increased bibasilar atelectasis. Electronically Signed   By: Richardean Sale M.D.   On: 02/04/2016 08:35      Assessment/Plan Abdominal pain with N/V Dilated small bowel loop - small  bowel fecalization, without high grade obstruction H/o multiple SBO and multiple abdominal surgeries - followed by Dr. Johney Maine in the office -Will admit to [facility] for observation of obstructive symptoms and medical management, hopefully this will resolve quickly like it has in the past.   -Hold off on NGT, but low threshold if N/V returns -Clear liquids diet, IVF, pain control, antiemetics -Ambulate and IS -Labs and KUB in am  Hyperglycemia - CBG 180 -Check A1C, SSI/CBG's -He'll need to f/u with PCP upon discharge   Nat Christen, [facility] 02/04/2016, 12:24 PM Pager: [phone] (7am - 4:30pm M-F; 7am - 11:30am Sa/Su)

## 2016-02-04 NOTE — Progress Notes (Signed)
Attempted to receive report. 

## 2016-02-05 ENCOUNTER — Observation Stay (HOSPITAL_COMMUNITY): Payer: Medicare Other

## 2016-02-05 LAB — MRSA PCR SCREENING: MRSA BY PCR: NEGATIVE

## 2016-02-05 LAB — HEMOGLOBIN A1C
Hgb A1c MFr Bld: 5.9 % — ABNORMAL HIGH (ref 4.8–5.6)
MEAN PLASMA GLUCOSE: 123 mg/dL

## 2016-02-05 MED ORDER — DOCUSATE SODIUM 100 MG PO CAPS: 100.0000 mg | ORAL_CAPSULE | Freq: Two times a day (BID) | ORAL | Status: DC

## 2016-02-05 NOTE — Discharge Summary (Signed)
Physician Discharge Summary  Matthew Hodges [id number] DOB: 08-26-1941 DOA: 02/04/2016  PCP: Marjorie Smolder, MD  Consultation: none  Admit date: 02/04/2016 Discharge date: 02/05/2016  Recommendations for Outpatient Follow-up:   Follow-up Information    Follow up with [name]., MD.   Specialty:  General Surgery   Contact information:   [address] [place] [address] [phone]      Discharge Diagnoses:  1. SBO   Surgical Procedure: none  Discharge Condition: stable Disposition: home  Diet recommendation: low residue x1 week, then as tolerated   Filed Weights   02/04/16 0659  Weight: 88.451 kg (195 lb)      Filed Vitals:   02/04/16 2019 02/05/16 0209  BP: 159/72 119/62  Pulse: 79 73  Temp: 98.2 F (36.8 C) 98.4 F (36.9 C)  Resp: 18 20    HPI:  75 y/o white male with a PMHx of CVA, diverticular disease, HTN, HLD, prostate cancer, BCC and multiple SBO, presents to [facility] with gradual onset of abdominal pain since 1am. He is well known to our service due to significant PSH with Dr. Excell Seltzer and Dr. Johney Maine. PSHx includes emergency sigmoid colectomy/Hartman for perforated diverticulitis in 2000. Complicated by wound infections and incisional hernias status post colostomy takedown 2000 and onlay repair with mesh 2001. 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 Developed incisional hernia at colostomy site which was laparoscopically repaired in 2008. Most recently ventral hernia repair and lysis of adhesions in 03/2015 by Dr. Radene Ou Course:  CT of abdomen and pelvis revealed a high grade SBO.  The patient was admitted for bowel rest, IV hydration.  He did not require NGT decompression. The following day, AXR showed resolution of SBO, perhaps a mild ileus.  He was having BMs, tolerating a soft diet, VSS and afebrile.  He was therefore felt stable for discharge home.  We discussed adhering to a lowe residue diet  for about 1 week and then advance as tolerated.  Warning signs that warrant further evaluation were discussed.  He was encouraged to call our office with questions or concerns.   Physical Exam: General: NAD. Abdomen: soft and non tender.   Discharge Instructions     Medication List    TAKE these medications        aspirin EC 81 MG tablet  Take 81 mg by mouth every morning.     atorvastatin 40 MG tablet  Commonly known as:  LIPITOR  Take 20 mg by mouth at bedtime.     docusate sodium 100 MG capsule  Commonly known as:  COLACE  Take 1 capsule (100 mg total) by mouth 2 (two) times daily.     MEGA MULTIVITAMIN FOR MEN PO  Take 1 tablet by mouth every morning.     mirabegron ER 25 MG Tb24 tablet  Commonly known as:  MYRBETRIQ  Take 25 mg by mouth every morning.     omega-3 acid ethyl esters 1 g capsule  Commonly known as:  LOVAZA  Take 1 g by mouth every morning.     Vitamin D3 400 units tablet  Take 400 Units by mouth every morning.           Follow-up Information    Follow up with [name]., MD.   Specialty:  General Surgery   Contact information:   [address] [place] [address] [phone]        The results of significant diagnostics  from this hospitalization (including imaging, microbiology, ancillary and laboratory) are listed below for reference.    Significant Diagnostic Studies: Ct Abdomen Pelvis W Contrast  02/04/2016  CLINICAL DATA:  Low abdominal pain today with nausea and vomiting. History of small bowel obstruction and multiple abdominal surgeries. EXAM: CT ABDOMEN AND PELVIS WITH CONTRAST TECHNIQUE: Multidetector CT imaging of the abdomen and pelvis was performed using the standard protocol following bolus administration of intravenous contrast. CONTRAST:  131mL OMNIPAQUE IOHEXOL 300 MG/ML  SOLN COMPARISON:  Radiographs today.  CT 12/06/2015) FINDINGS: Lower chest: Clear lung bases. No significant pleural or pericardial  effusion. Hepatobiliary: The liver is normal in density without focal abnormality. No evidence of gallstones, gallbladder wall thickening or biliary dilatation. Pancreas: Unremarkable. No pancreatic ductal dilatation or surrounding inflammatory changes. Spleen: Normal in size without focal abnormality. Adrenals/Urinary Tract: Both adrenal glands appear normal. Horseshoe kidney again demonstrated with probable small cysts bilaterally. No evidence of urinary tract calculus or hydronephrosis. The bladder is decompressed without definite abnormality. Stomach/Bowel: No high-grade bowel obstruction identified. There is a mildly dilated loop of small bowel posterior to the anterior abdominal wall incision, measuring up to 4.2 cm in diameter. This demonstrates an associated small bowel feces sign. As before, the small bowel loops may be adherent in this area, but there is no bowel wall thickening or definite incarceration. No other significant small bowel distention identified. The colon is decompressed. The appendix appears normal. There is no extraluminal fluid collection or focal inflammatory change . Vascular/Lymphatic: There are no enlarged abdominal or pelvic lymph nodes. There is severe atherosclerosis of the aorta, its branches and the iliac arteries. No large vessel occlusion identified, although there is probable significant stenosis of the distal aorta. Reproductive: Unremarkable. Other: Postsurgical changes within the anterior abdominal wall with mild diastasis of the rectus abdominus muscles. No focal hernia or significant change identified. Musculoskeletal: No acute or significant osseous findings. Stable mild scoliosis and spondylosis. IMPRESSION: 1. No recurrent high-grade small bowel obstruction identified. There is a single loop of mildly dilated small bowel deep to the anterior abdominal wall incision which contains a small bowel feces sign, potentially secondary to chronic partial obstruction. 2. No  intra-abdominal inflammatory changes or extraluminal fluid collection. 3. Severe atherosclerosis with high-grade stenosis of the aortic lumen. 4. Stable postsurgical changes within the anterior abdominal wall without evidence of recurrent focal hernia. Electronically Signed   By: Richardean Sale M.D.   On: 02/04/2016 12:06   Dg Abd Acute W/chest  02/04/2016  CLINICAL DATA:  Low abdominal pain for 1 day. History of chronic small bowel obstruction with multiple surgeries. EXAM: DG ABDOMEN ACUTE W/ 1V CHEST COMPARISON:  Radiographs 12/06/2015 and 12/08/2015.  CT 12/06/2015. FINDINGS: There are lower lung volumes with resulting mildly increased atelectasis at both lung bases. The heart size mediastinal contours are stable with prominent aortic atherosclerosis. There is no pleural effusion or pneumothorax. No significantly dilated loops of bowel are demonstrated. However, there are multiple air-fluid levels within the small bowel at different levels on the erect examination. There is gas and stool within the colon. There are postsurgical changes within the anterior abdominal wall. No evidence of free intraperitoneal air. IMPRESSION: Although nonspecific, given previous findings and history, the presence of multiple small bowel air-fluid levels at different levels may reflect recurrent low grade small bowel obstruction. No evidence of free intraperitoneal air. Mildly increased bibasilar atelectasis. Electronically Signed   By: Richardean Sale M.D.   On: 02/04/2016 08:35   Dg Abd  Portable 2v  02/05/2016  CLINICAL DATA:  Lower abdominal pain ; history of multiple abdominal surgeries and previous small bowel obstruction episodes. EXAM: PORTABLE ABDOMEN - 2 VIEW COMPARISON:  Abdominal and pelvic CT scan of February 04, 2016 FINDINGS: The stool and gas pattern within the colon is normal. There is a loop of mildly distended small bowel to the left of the lower lumbar spine. No free extraluminal gas collections are observed.  There are no abnormal soft tissue calcifications. There are degenerative changes of the lumbar spine. Multiple coils are present from previous abdominal wall hernia repair. IMPRESSION: There is no evidence of a small bowel obstruction. Minimal small bowel ileus may be present. Electronically Signed   By: David  Martinique M.D.   On: 02/05/2016 07:46    Microbiology: Recent Results (from the past 240 hour(s))  MRSA PCR Screening     Status: None   Collection Time: 02/04/16 10:06 PM  Result Value Ref Range Status   MRSA by PCR NEGATIVE NEGATIVE Final    Comment:        The GeneXpert MRSA Assay (FDA approved for NASAL specimens only), is one component of a comprehensive MRSA colonization surveillance program. It is not intended to diagnose MRSA infection nor to guide or monitor treatment for MRSA infections.      Labs: Basic Metabolic Panel:  Recent Labs Lab 02/04/16 0750  NA 139  K 4.9  CL 100*  CO2 24  GLUCOSE 180*  BUN 17  CREATININE 1.19  CALCIUM 9.8   Liver Function Tests:  Recent Labs Lab 02/04/16 0750  AST 28  ALT 27  ALKPHOS 71  BILITOT 0.8  PROT 7.0  ALBUMIN 4.0    Recent Labs Lab 02/04/16 0750  LIPASE 35   No results for input(s): AMMONIA in the last 168 hours. CBC:  Recent Labs Lab 02/04/16 0750  WBC 11.6*  NEUTROABS 10.1*  HGB 14.9  HCT 43.7  MCV 91.8  PLT 187   Cardiac Enzymes: No results for input(s): CKTOTAL, CKMB, CKMBINDEX, TROPONINI in the last 168 hours. BNP: BNP (last 3 results) No results for input(s): BNP in the last 8760 hours.  ProBNP (last 3 results) No results for input(s): PROBNP in the last 8760 hours.  CBG: No results for input(s): GLUCAP in the last 168 hours.  Active Problems:   Abdominal pain   Time coordinating discharge: <30 mins   Signed:  Sadik Piascik, ANP-BC

## 2016-02-05 NOTE — Care Management Obs Status (Signed)
[facility]inch NOTIFICATION   Patient Details  Name: JALEON VIDRINE MRN: [id number] Date of Birth: 28-Apr-1941   Medicare Observation Status Notification Given:  Yes (abd xray negative and symptoms improved quickly)    Pollie Friar, RN 02/05/2016, 10:52 AM

## 2016-02-05 NOTE — Progress Notes (Signed)
Pt discharged per orders from MD. Pt educated on discharge instructions. Pt verbalized understanding of instructions. All questions and concerns were addressed. Pt's IV was removed before discharge. Pt able to ambulate to exit hospital.

## 2016-02-05 NOTE — Progress Notes (Signed)
Nutrition Education Note  RD consulted for nutrition education regarding nutrition management for diverticulosis/ Diverticulitis.    RD provided "Low Fiber Nutrition Therapy" handout as well as "5 Sample Menus for Gradually Increasing Fiber" handout from the Academy of Nutrition and Dietetics. Reviewed patient's dietary recall and discussed ways for pt to meet nutrition goals over the next several weeks. Explained reasons for pt to follow a low fiber diet. Reviewed low fiber foods and high fiber foods. Discussed best practice for long term management of diverticulosis is to add some fiber back to diet and discussed ways to gradually increase fiber (especially soluble fiber) in the diet.   Teach back method used. Pt verbalizes understanding of information provided.   Expect very good compliance.  Body mass index is Body mass index is 29.66 kg/(m^2).Marland Kitchen Pt meets criteria for Overweight based on current BMI.  Current diet order is SOFT. Pt ready for discharge at time of visit. Labs and medications reviewed. No further nutrition interventions warranted at this time. RD contact information provided.  Scarlette Ar RD, LDN Inpatient Clinical Dietitian Pager: [phone] After Hours Pager: [phone]

## 2016-02-05 NOTE — Discharge Instructions (Signed)
Small Bowel Obstruction °A small bowel obstruction is a blockage in the small bowel. The small bowel, which is also called the small intestine, is a long, slender tube that connects the stomach to the colon. When a person eats and drinks, food and fluids go from the stomach to the small bowel. This is where most of the nutrients in the food and fluids are absorbed. °A small bowel obstruction will prevent food and fluids from passing through the small bowel as they normally do during digestion. The small bowel can become partially or completely blocked. This can cause symptoms such as abdominal pain, vomiting, and bloating. If this condition is not treated, it can be dangerous because the small bowel could rupture. °CAUSES °Common causes of this condition include: °· Scar tissue from previous surgery or radiation treatment. °· Recent surgery. This may cause the movements of the bowel to slow down and cause food to block the intestine. °· Hernias. °· Inflammatory bowel disease (colitis). °· Twisting of the bowel (volvulus). °· Tumors. °· A foreign body. °· Slipping of a part of the bowel into another part (intussusception). °SYMPTOMS °Symptoms of this condition include: °· Abdominal pain. This may be dull cramps or sharp pain. It may occur in one area, or it may be present in the entire abdomen. Pain can range from mild to severe, depending on the degree of obstruction. °· Nausea and vomiting. Vomit may be greenish or a yellow bile color. °· Abdominal bloating. °· Constipation. °· Lack of passing gas. °· Frequent belching. °· Diarrhea. This may occur if the obstruction is partial and runny stool is able to leak around the obstruction. °DIAGNOSIS °This condition may be diagnosed based on a physical exam, medical history, and X-rays of the abdomen. You may also have other tests, such as a CT scan of the abdomen and pelvis. °TREATMENT °Treatment for this condition depends on the cause and severity of the problem.  Treatment options may include: °· Bed rest along with fluids and pain medicines that are given through an IV tube inserted into one of your veins. Sometimes, this is all that is needed for the obstruction to improve. °· Following a simple diet. In some cases, a clear liquid diet may be required for several days. This allows the bowel to rest. °· Placement of a small tube (nasogastric tube) into the stomach. When the bowel is blocked, it usually swells up like a balloon that is filled with air and fluids. The air and fluids may be removed by suction through the nasogastric tube. This can help with pain, discomfort, and nausea. It can also help the obstruction to clear up faster. °· Surgery. This may be required if other treatments do not work. Bowel obstruction from a hernia may require early surgery and can be an emergency procedure. Surgery may also be required for scar tissue that causes frequent or severe obstructions. °HOME CARE INSTRUCTIONS °· Get plenty of rest. °· Follow instructions from your health care provider about eating restrictions. You may need to avoid solid foods and consume only clear liquids until your condition improves. °· Take over-the-counter and prescription medicines only as told by your health care provider. °· Keep all follow-up visits as told by your health care provider. This is important. °SEEK MEDICAL CARE IF: °· You have a fever. °· You have chills. °SEEK IMMEDIATE MEDICAL CARE IF: °· You have increased pain or cramping. °· You vomit blood. °· You have uncontrolled vomiting or nausea. °· You cannot drink   fluids because of vomiting or pain. °· You develop confusion. °· You begin feeling very dry or thirsty (dehydrated). °· You have severe bloating. °· You feel extremely weak or you faint. °  °This information is not intended to replace advice given to you by your health care provider. Make sure you discuss any questions you have with your health care provider. °  °Document Released:  02/02/2006 Document Revised: 08/07/2015 Document Reviewed: 01/10/2015 °Elsevier Interactive Patient Education ©2016 [employer] ° ° °

## 2016-02-05 NOTE — Care Management Note (Signed)
Case Management Note  Patient Details  Name: Matthew Hodges MRN: [id number] Date of Birth: 16-Aug-1941  Subjective/Objective:                    Action/Plan: Patient discharging home with self care. No further needs per CM.   Expected Discharge Date:                  Expected Discharge Plan:     In-House Referral:     Discharge planning Services     Post Acute Care Choice:    Choice offered to:     DME Arranged:    DME Agency:     HH Arranged:    [place] Agency:     Status of Service:     Medicare Important Message Given:    Date Medicare IM Given:    Medicare IM give by:    Date Additional Medicare IM Given:    Additional Medicare Important Message give by:     If discussed at [place] of Stay Meetings, dates discussed:    Additional Comments:  Pollie Friar, RN 02/05/2016, 10:52 AM

## 2016-02-05 NOTE — Progress Notes (Signed)
Subjective: Feels fine.  Having soft bowel movements.  No pain or nausea.  Tolerating clear liquids.  Hungry. He declined lab work this morning because he felt so good.  I think that's reasonable.  We'll advance him to a soft low-fat residue diet.  I've asked nutritional services to educate him on this.  Is not clear whether he has partial SBO with fecalizazation of the small bowel or a bezoar. Nevertheless he's asymptomatic and if he tolerates a soft diet he can go home this evening.  Objective: Vital signs in last 24 hours: Temp:  [98.2 F (36.8 C)-98.4 F (36.9 C)] 98.4 F (36.9 C) (03/08 0209) Pulse Rate:  [71-83] 73 (03/08 0209) Resp:  [16-20] 20 (03/08 0209) BP: (119-159)/(56-72) 119/62 mmHg (03/08 0209) SpO2:  [93 %-99 %] 95 % (03/08 0209) Last BM Date: 02/04/16  Intake/Output from previous day: 03/07 0701 - 03/08 0700 In: 450 [I.V.:450] Out: 325 [Urine:325] Intake/Output this shift:    General appearance: Alert.  Very pleasant.  No distress.  Mental status normal. GI: Soft.  Nontender.  No mass or hernia noted.  A little eventration centrally.  Skin healthy.  Lab Results:   Recent Labs  02/04/16 0750  WBC 11.6*  HGB 14.9  HCT 43.7  PLT 187   BMET  Recent Labs  02/04/16 0750  NA 139  K 4.9  CL 100*  CO2 24  GLUCOSE 180*  BUN 17  CREATININE 1.19  CALCIUM 9.8   PT/INR No results for input(s): LABPROT, INR in the last 72 hours. ABG No results for input(s): PHART, HCO3 in the last 72 hours.  Invalid input(s): PCO2, PO2  Studies/Results: Ct Abdomen Pelvis W Contrast  02/04/2016  CLINICAL DATA:  Low abdominal pain today with nausea and vomiting. History of small bowel obstruction and multiple abdominal surgeries. EXAM: CT ABDOMEN AND PELVIS WITH CONTRAST TECHNIQUE: Multidetector CT imaging of the abdomen and pelvis was performed using the standard protocol following bolus administration of intravenous contrast. CONTRAST:  177mL OMNIPAQUE IOHEXOL 300  MG/ML  SOLN COMPARISON:  Radiographs today.  CT 12/06/2015) FINDINGS: Lower chest: Clear lung bases. No significant pleural or pericardial effusion. Hepatobiliary: The liver is normal in density without focal abnormality. No evidence of gallstones, gallbladder wall thickening or biliary dilatation. Pancreas: Unremarkable. No pancreatic ductal dilatation or surrounding inflammatory changes. Spleen: Normal in size without focal abnormality. Adrenals/Urinary Tract: Both adrenal glands appear normal. Horseshoe kidney again demonstrated with probable small cysts bilaterally. No evidence of urinary tract calculus or hydronephrosis. The bladder is decompressed without definite abnormality. Stomach/Bowel: No high-grade bowel obstruction identified. There is a mildly dilated loop of small bowel posterior to the anterior abdominal wall incision, measuring up to 4.2 cm in diameter. This demonstrates an associated small bowel feces sign. As before, the small bowel loops may be adherent in this area, but there is no bowel wall thickening or definite incarceration. No other significant small bowel distention identified. The colon is decompressed. The appendix appears normal. There is no extraluminal fluid collection or focal inflammatory change . Vascular/Lymphatic: There are no enlarged abdominal or pelvic lymph nodes. There is severe atherosclerosis of the aorta, its branches and the iliac arteries. No large vessel occlusion identified, although there is probable significant stenosis of the distal aorta. Reproductive: Unremarkable. Other: Postsurgical changes within the anterior abdominal wall with mild diastasis of the rectus abdominus muscles. No focal hernia or significant change identified. Musculoskeletal: No acute or significant osseous findings. Stable mild scoliosis and spondylosis. IMPRESSION: 1.  No recurrent high-grade small bowel obstruction identified. There is a single loop of mildly dilated small bowel deep to the  anterior abdominal wall incision which contains a small bowel feces sign, potentially secondary to chronic partial obstruction. 2. No intra-abdominal inflammatory changes or extraluminal fluid collection. 3. Severe atherosclerosis with high-grade stenosis of the aortic lumen. 4. Stable postsurgical changes within the anterior abdominal wall without evidence of recurrent focal hernia. Electronically Signed   By: Richardean Sale M.D.   On: 02/04/2016 12:06   Dg Abd Acute W/chest  02/04/2016  CLINICAL DATA:  Low abdominal pain for 1 day. History of chronic small bowel obstruction with multiple surgeries. EXAM: DG ABDOMEN ACUTE W/ 1V CHEST COMPARISON:  Radiographs 12/06/2015 and 12/08/2015.  CT 12/06/2015. FINDINGS: There are lower lung volumes with resulting mildly increased atelectasis at both lung bases. The heart size mediastinal contours are stable with prominent aortic atherosclerosis. There is no pleural effusion or pneumothorax. No significantly dilated loops of bowel are demonstrated. However, there are multiple air-fluid levels within the small bowel at different levels on the erect examination. There is gas and stool within the colon. There are postsurgical changes within the anterior abdominal wall. No evidence of free intraperitoneal air. IMPRESSION: Although nonspecific, given previous findings and history, the presence of multiple small bowel air-fluid levels at different levels may reflect recurrent low grade small bowel obstruction. No evidence of free intraperitoneal air. Mildly increased bibasilar atelectasis. Electronically Signed   By: Richardean Sale M.D.   On: 02/04/2016 08:35   Dg Abd Portable 2v  02/05/2016  CLINICAL DATA:  Lower abdominal pain ; history of multiple abdominal surgeries and previous small bowel obstruction episodes. EXAM: PORTABLE ABDOMEN - 2 VIEW COMPARISON:  Abdominal and pelvic CT scan of February 04, 2016 FINDINGS: The stool and gas pattern within the colon is normal. There  is a loop of mildly distended small bowel to the left of the lower lumbar spine. No free extraluminal gas collections are observed. There are no abnormal soft tissue calcifications. There are degenerative changes of the lumbar spine. Multiple coils are present from previous abdominal wall hernia repair. IMPRESSION: There is no evidence of a small bowel obstruction. Minimal small bowel ileus may be present. Electronically Signed   By: David  Martinique M.D.   On: 02/05/2016 07:46    Anti-infectives: Anti-infectives    None      Assessment/Plan:  Abdominal pain with nausea vomiting.  Rapid resolution.  Not sure whether this is a partial SBO or just a bezoar. Nevertheless he is opening up and doing well Try soft diet Home this evening if tolerates  History of multiple SBO and multiple abdominal surgeries, followed by Dr. gross in the office.  Hyperglycemia on admission.  Transient.             [name] 02/05/2016

## 2016-02-06 LAB — URINE CULTURE

## 2016-03-30 DIAGNOSIS — K56609 Unspecified intestinal obstruction, unspecified as to partial versus complete obstruction: Secondary | ICD-10-CM

## 2016-03-30 HISTORY — DX: Unspecified intestinal obstruction, unspecified as to partial versus complete obstruction: K56.609

## 2016-04-21 ENCOUNTER — Emergency Department (HOSPITAL_COMMUNITY): Payer: Medicare Other

## 2016-04-21 ENCOUNTER — Inpatient Hospital Stay (HOSPITAL_COMMUNITY)
Admission: EM | Admit: 2016-04-21 | Discharge: 2016-04-22 | DRG: 390 | Disposition: A | Discharge status: Home / Self Care | Payer: Medicare Other | Attending: Surgery | Admitting: Surgery

## 2016-04-21 ENCOUNTER — Encounter (HOSPITAL_COMMUNITY): Payer: Self-pay | Admitting: *Deleted

## 2016-04-21 ENCOUNTER — Inpatient Hospital Stay (HOSPITAL_COMMUNITY): Payer: Medicare Other

## 2016-04-21 DIAGNOSIS — Z85828 Personal history of other malignant neoplasm of skin: Secondary | ICD-10-CM

## 2016-04-21 DIAGNOSIS — K56609 Unspecified intestinal obstruction, unspecified as to partial versus complete obstruction: Secondary | ICD-10-CM | POA: Diagnosis present

## 2016-04-21 DIAGNOSIS — Z7982 Long term (current) use of aspirin: Secondary | ICD-10-CM | POA: Diagnosis not present

## 2016-04-21 DIAGNOSIS — K5790 Diverticulosis of intestine, part unspecified, without perforation or abscess without bleeding: Secondary | ICD-10-CM | POA: Diagnosis present

## 2016-04-21 DIAGNOSIS — K565 Intestinal adhesions [bands] with obstruction (postprocedural) (postinfection): Principal | ICD-10-CM | POA: Diagnosis present

## 2016-04-21 DIAGNOSIS — M5136 Other intervertebral disc degeneration, lumbar region: Secondary | ICD-10-CM | POA: Diagnosis present

## 2016-04-21 DIAGNOSIS — I10 Essential (primary) hypertension: Secondary | ICD-10-CM | POA: Diagnosis present

## 2016-04-21 DIAGNOSIS — D72829 Elevated white blood cell count, unspecified: Secondary | ICD-10-CM | POA: Diagnosis present

## 2016-04-21 DIAGNOSIS — Z8673 Personal history of transient ischemic attack (TIA), and cerebral infarction without residual deficits: Secondary | ICD-10-CM

## 2016-04-21 DIAGNOSIS — Z91040 Latex allergy status: Secondary | ICD-10-CM | POA: Diagnosis not present

## 2016-04-21 DIAGNOSIS — Z8546 Personal history of malignant neoplasm of prostate: Secondary | ICD-10-CM

## 2016-04-21 DIAGNOSIS — E785 Hyperlipidemia, unspecified: Secondary | ICD-10-CM | POA: Diagnosis present

## 2016-04-21 DIAGNOSIS — Z87891 Personal history of nicotine dependence: Secondary | ICD-10-CM

## 2016-04-21 DIAGNOSIS — Z0189 Encounter for other specified special examinations: Secondary | ICD-10-CM

## 2016-04-21 DIAGNOSIS — K5669 Other intestinal obstruction: Secondary | ICD-10-CM | POA: Diagnosis present

## 2016-04-21 HISTORY — DX: Unspecified intestinal obstruction, unspecified as to partial versus complete obstruction: K56.609

## 2016-04-21 LAB — URINALYSIS, ROUTINE W REFLEX MICROSCOPIC
BILIRUBIN URINE: NEGATIVE
GLUCOSE, UA: NEGATIVE mg/dL
HGB URINE DIPSTICK: NEGATIVE
KETONES UR: NEGATIVE mg/dL
LEUKOCYTES UA: NEGATIVE
Nitrite: NEGATIVE
PROTEIN: NEGATIVE mg/dL
Specific Gravity, Urine: 1.025 (ref 1.005–1.030)
pH: 5 (ref 5.0–8.0)

## 2016-04-21 LAB — CBC
HCT: 42 % (ref 39.0–52.0)
Hemoglobin: 14.2 g/dL (ref 13.0–17.0)
MCH: 30.6 pg (ref 26.0–34.0)
MCHC: 33.8 g/dL (ref 30.0–36.0)
MCV: 90.5 fL (ref 78.0–100.0)
PLATELETS: 231 10*3/uL (ref 150–400)
RBC: 4.64 MIL/uL (ref 4.22–5.81)
RDW: 13 % (ref 11.5–15.5)
WBC: 16.3 10*3/uL — AB (ref 4.0–10.5)

## 2016-04-21 LAB — COMPREHENSIVE METABOLIC PANEL
ALT: 27 U/L (ref 17–63)
AST: 59 U/L — AB (ref 15–41)
Albumin: 3.9 g/dL (ref 3.5–5.0)
Alkaline Phosphatase: 72 U/L (ref 38–126)
Anion gap: 10 (ref 5–15)
BILIRUBIN TOTAL: 1.1 mg/dL (ref 0.3–1.2)
BUN: 22 mg/dL — AB (ref 6–20)
CHLORIDE: 105 mmol/L (ref 101–111)
CO2: 22 mmol/L (ref 22–32)
CREATININE: 1.32 mg/dL — AB (ref 0.61–1.24)
Calcium: 9.6 mg/dL (ref 8.9–10.3)
GFR calc Af Amer: 60 mL/min — ABNORMAL LOW (ref >60)
GFR, EST NON AFRICAN AMERICAN: 51 mL/min — AB (ref >60)
Glucose, Bld: 160 mg/dL — ABNORMAL HIGH (ref 65–99)
Potassium: 5.2 mmol/L — ABNORMAL HIGH (ref 3.5–5.1)
Sodium: 137 mmol/L (ref 135–145)
Total Protein: 6.7 g/dL (ref 6.5–8.1)

## 2016-04-21 LAB — LIPASE, BLOOD: Lipase: 37 U/L (ref 11–51)

## 2016-04-21 MED ORDER — MORPHINE SULFATE (PF) 4 MG/ML IV SOLN
4.0000 mg | Freq: Once | INTRAVENOUS | Status: AC
  Administered 2016-04-21: 4 mg via INTRAVENOUS
  Filled 2016-04-21: qty 1

## 2016-04-21 MED ORDER — DIATRIZOATE MEGLUMINE & SODIUM 66-10 % PO SOLN
90.0000 mL | Freq: Once | ORAL | Status: AC
  Administered 2016-04-21: 90 mL via NASOGASTRIC
  Filled 2016-04-21 (×2): qty 90

## 2016-04-21 MED ORDER — DIATRIZOATE MEGLUMINE & SODIUM 66-10 % PO SOLN
90.0000 mL | Freq: Once | ORAL | Status: DC
  Filled 2016-04-21: qty 90

## 2016-04-21 MED ORDER — MORPHINE SULFATE (PF) 2 MG/ML IV SOLN: 2.0000 mg | INTRAVENOUS | Status: DC | PRN

## 2016-04-21 MED ORDER — ENOXAPARIN SODIUM 40 MG/0.4ML ~~LOC~~ SOLN
40.0000 mg | SUBCUTANEOUS | Status: DC
  Administered 2016-04-21 – 2016-04-22 (×2): 40 mg via SUBCUTANEOUS
  Filled 2016-04-21 (×2): qty 0.4

## 2016-04-21 MED ORDER — ONDANSETRON 4 MG PO TBDP
8.0000 mg | ORAL_TABLET | Freq: Once | ORAL | Status: AC
  Administered 2016-04-21: 8 mg via ORAL
  Filled 2016-04-21: qty 2

## 2016-04-21 MED ORDER — ONDANSETRON HCL 4 MG/2ML IJ SOLN
4.0000 mg | Freq: Once | INTRAMUSCULAR | Status: AC
  Administered 2016-04-21: 4 mg via INTRAVENOUS
  Filled 2016-04-21: qty 2

## 2016-04-21 MED ORDER — IOPAMIDOL (ISOVUE-300) INJECTION 61%
INTRAVENOUS | Status: AC
  Administered 2016-04-21: 100 mL via INTRAVENOUS
  Filled 2016-04-21: qty 100

## 2016-04-21 MED ORDER — OXYCODONE-ACETAMINOPHEN 5-325 MG PO TABS
1.0000 | ORAL_TABLET | Freq: Once | ORAL | Status: AC
  Administered 2016-04-21: 1 via ORAL
  Filled 2016-04-21: qty 1

## 2016-04-21 MED ORDER — ONDANSETRON HCL 4 MG/2ML IJ SOLN: 4.0000 mg | Freq: Four times a day (QID) | INTRAMUSCULAR | Status: DC | PRN

## 2016-04-21 MED ORDER — ONDANSETRON 4 MG PO TBDP: 4.0000 mg | ORAL_TABLET | Freq: Four times a day (QID) | ORAL | Status: DC | PRN

## 2016-04-21 MED ORDER — DEXTROSE-NACL 5-0.9 % IV SOLN
INTRAVENOUS | Status: DC
  Administered 2016-04-21: 11:00:00 via INTRAVENOUS

## 2016-04-21 NOTE — ED Notes (Signed)
Pt stated he was experiencing more pain, Nurse [facility] RN notified.

## 2016-04-21 NOTE — ED Provider Notes (Signed)
CSN: [id number]     Arrival date & time 04/21/16  [id number] History   First MD Initiated Contact with Patient 04/21/16 0557     Chief Complaint  Patient presents with  . Abdominal Pain     (Consider location/radiation/quality/duration/timing/severity/associated sxs/prior Treatment) HPI Comments: Patient with PMH of HTN, HL, multiple prior SBOs, presents to the ED with a chief complaint of abdominal pain that started last night and 10:00 PM.  He states that this feels like prior SBOs.  He reports that his pain comes in waves and is severe.  He reports some associated nausea, but denies vomiting, or diarrhea.  Denies any fevers, chills, or dysuria.  States he has had a colostomy in the past and a reversal.  Has had multiple abdominal hernias.  He denies any dysuria or hematuria.  The history is provided by the patient. No language interpreter was used.    Past Medical History  Diagnosis Date  . Cerebral artery disease   . Diverticular disease   . Hyperlipidemia   . Hypertension     hx. of . no meds since 2004  . Prostate cancer [facility])     treated with radiation  . Basal cell carcinoma     "sqattered"  . Squamous carcinoma ([place])     "sqattered"  . Heart murmur     "after surgeries before"  . Stroke [facility]) 1979    no deficits   Past Surgical History  Procedure Laterality Date  . Partial colectomy  01/1999    Hartmans procedure with colostomy  . Small bowel repair  03/1999    postop abscesses with fistula.  Abscesses drained.  Fistula repaired.  . Colostomy takedown  08/1999  . Incisional hernia repair  2001    open w mesh  . Laparoscopic incisional / umbilical / ventral hernia repair  2008    with mesh.  LLQ old colostomy site  . Remove suture under anesthesia  2011    stitch abscesses LLQ & midline  . Ventral hernia repair N/A 04/23/2015    Procedure: LAPAROSCOPIC VENTRAL WALL HERNIA REPAIR AND LAP LYSIS OF ADHESIONS;  Surgeon: Michael Boston, MD;  Location: [facility] ORS;  Service: General;   Laterality: N/A;  With MESH  . Lysis of adhesion N/A 04/23/2015    Procedure: LYSIS OF ADHESION;  Surgeon: Michael Boston, MD;  Location: [facility] ORS;  Service: General;  Laterality: N/A;  . Colon surgery    . Hernia repair      "lots; had diverticulitis years ago"  . Prostate biopsy  ~ 2002   No family history on file. Social History  Substance Use Topics  . Smoking status: Former Smoker -- 2.00 packs/day for 47 years    Types: Cigarettes    Quit date: 11/30/2004  . Smokeless tobacco: Never Used  . Alcohol Use: Yes     Comment: recovering alcoholic sober since [id number]    Review of Systems  Constitutional: Negative for fever and chills.  Respiratory: Negative for shortness of breath.   Cardiovascular: Negative for chest pain.  Gastrointestinal: Positive for nausea and abdominal pain. Negative for vomiting, diarrhea and constipation.  Genitourinary: Negative for dysuria.  All other systems reviewed and are negative.     Allergies  Latex  Home Medications   Prior to Admission medications   Medication Sig Start Date End Date Taking? Authorizing Provider  aspirin EC 81 MG tablet Take 81 mg by mouth every morning.     Historical Provider, MD  atorvastatin (LIPITOR)  40 MG tablet Take 20 mg by mouth at bedtime.    Historical Provider, MD  Cholecalciferol (VITAMIN D3) 400 UNITS tablet Take 400 Units by mouth every morning.     Historical Provider, MD  docusate sodium (COLACE) 100 MG capsule Take 1 capsule (100 mg total) by mouth 2 (two) times daily. 02/05/16   Emina Riebock, NP  mirabegron ER (MYRBETRIQ) 25 MG TB24 tablet Take 25 mg by mouth every morning.    Historical Provider, MD  Multiple Vitamins-Minerals (MEGA MULTIVITAMIN FOR MEN PO) Take 1 tablet by mouth every morning.     Historical Provider, MD  omega-3 acid ethyl esters (LOVAZA) 1 G capsule Take 1 g by mouth every morning.     Historical Provider, MD   BP 175/70 mmHg  Pulse 57  Temp(Src) 97.8 F (36.6 C)  Resp 20  Ht 5\' 8"   (1.727 m)  Wt 87.998 kg  BMI 29.50 kg/m2  SpO2 96% Physical Exam  Constitutional: He is oriented to person, place, and time. He appears well-developed and well-nourished.  HENT:  Head: Normocephalic and atraumatic.  Eyes: Conjunctivae and EOM are normal. Pupils are equal, round, and reactive to light. Right eye exhibits no discharge. Left eye exhibits no discharge. No scleral icterus.  Neck: Normal range of motion. Neck supple. No JVD present.  Cardiovascular: Normal rate, regular rhythm and normal heart sounds.  Exam reveals no gallop and no friction rub.   No murmur heard. Pulmonary/Chest: Effort normal and breath sounds normal. No respiratory distress. He has no wheezes. He has no rales. He exhibits no tenderness.  Abdominal: He exhibits distension. He exhibits no mass. There is tenderness. There is no rebound and no guarding.  Periumbilical abdominal tenderness  Musculoskeletal: Normal range of motion. He exhibits no edema or tenderness.  Neurological: He is alert and oriented to person, place, and time.  Skin: Skin is warm and dry.  Psychiatric: He has a normal mood and affect. His behavior is normal. Judgment and thought content normal.  Nursing note and vitals reviewed.   ED Course  Procedures (including critical care time) Results for orders placed or performed during the hospital encounter of 04/21/16  Lipase, blood  Result Value Ref Range   Lipase 37 11 - 51 U/L  Comprehensive metabolic panel  Result Value Ref Range   Sodium 137 135 - 145 mmol/L   Potassium 5.2 (H) 3.5 - 5.1 mmol/L   Chloride 105 101 - 111 mmol/L   CO2 22 22 - 32 mmol/L   Glucose, Bld 160 (H) 65 - 99 mg/dL   BUN 22 (H) 6 - 20 mg/dL   Creatinine, Ser 1.32 (H) 0.61 - 1.24 mg/dL   Calcium 9.6 8.9 - 10.3 mg/dL   Total Protein 6.7 6.5 - 8.1 g/dL   Albumin 3.9 3.5 - 5.0 g/dL   AST 59 (H) 15 - 41 U/L   ALT 27 17 - 63 U/L   Alkaline Phosphatase 72 38 - 126 U/L   Total Bilirubin 1.1 0.3 - 1.2 mg/dL   GFR  calc non Af Amer 51 (L) >60 mL/min   GFR calc Af Amer 60 (L) >60 mL/min   Anion gap 10 5 - 15  CBC  Result Value Ref Range   WBC 16.3 (H) 4.0 - 10.5 K/uL   RBC 4.64 4.22 - 5.81 MIL/uL   Hemoglobin 14.2 13.0 - 17.0 g/dL   HCT 42.0 39.0 - 52.0 %   MCV 90.5 78.0 - 100.0 fL   MCH  30.6 26.0 - 34.0 pg   MCHC 33.8 30.0 - 36.0 g/dL   RDW 13.0 11.5 - 15.5 %   Platelets 231 150 - 400 K/uL  Urinalysis, Routine w reflex microscopic  Result Value Ref Range   Color, Urine YELLOW YELLOW   APPearance CLEAR CLEAR   Specific Gravity, Urine 1.025 1.005 - 1.030   pH 5.0 5.0 - 8.0   Glucose, UA NEGATIVE NEGATIVE mg/dL   Hgb urine dipstick NEGATIVE NEGATIVE   Bilirubin Urine NEGATIVE NEGATIVE   Ketones, ur NEGATIVE NEGATIVE mg/dL   Protein, ur NEGATIVE NEGATIVE mg/dL   Nitrite NEGATIVE NEGATIVE   Leukocytes, UA NEGATIVE NEGATIVE   Ct Abdomen Pelvis W Contrast  04/21/2016  CLINICAL DATA:  Midline lower abdominal pain since 2200 hours last night, nausea, vomiting, constipation, abnormal abdominal radiographs with dilated small bowel loops, post colonic surgery for diverticulitis, prostatectomy for prostate cancer, hypertension, prior herniorrhaphy ease, former smoker EXAM: CT ABDOMEN AND PELVIS WITH CONTRAST TECHNIQUE: Multidetector CT imaging of the abdomen and pelvis was performed using the standard protocol following bolus administration of intravenous contrast. Sagittal and coronal MPR images reconstructed from axial data set. CONTRAST:  ISOVUE-300 IOPAMIDOL (ISOVUE-300) INJECTION 61% IV. Oral contrast not administered. COMPARISON:  02/04/2016; abdominal radiographs 04/21/2016 FINDINGS: Lower chest: Bibasilar atelectasis. Coronary arterial calcifications. Hepatobiliary: Mildly distended gallbladder without calcification. Tiny RIGHT lobe probable hepatic cyst, stable. Liver otherwise normal appearance. No biliary dilatation Pancreas: Normal appearance Spleen: Normal appearance Adrenals/Urinary Tract: Adrenal  glands normal appearance. Horseshoe kidney with small cysts. No hydronephrosis, additional renal mass, or urinary tract calcification. Decompressed bladder. Unremarkable ureters. Stomach/Bowel: Normal appendix. Dilated proximal and decompressed distal small bowel loops compatible with mid small bowel obstruction. Transition zone appears to be at the anterior mid abdomen question adhesion. Colon decompressed with few scattered colonic diverticula. Tiny hiatal hernia, stomach otherwise unremarkable. Vascular/Lymphatic: Scattered atherosclerotic calcifications without aneurysm. No adenopathy Reproductive: N/A Other: No mass, free fluid, or free air. Suspect small BILATERAL inguinal hernias containing fat. Musculoskeletal: Bones demineralized. Scattered degenerative disc disease changes lumbar spine. IMPRESSION: Mid small bowel obstruction secondary to probable adhesion at the anterior mid abdomen. No evidence of perforation or abscess. Minimal colonic diverticulosis. Horseshoe kidney. Suspected small BILATERAL inguinal hernias. Electronically Signed   By: Lavonia Dana M.D.   On: 04/21/2016 07:53   Dg Abd Acute W/chest  04/21/2016  CLINICAL DATA:  Mid lower abdominal pain. History of small obstruction. EXAM: DG ABDOMEN ACUTE W/ 1V CHEST COMPARISON:  Most recent radiograph 02/05/2016.  CT 02/04/2016 FINDINGS: The cardiomediastinal contours are normal. Bibasilar atelectasis or scarring. There is no free intra-abdominal air. Multiple air-fluid levels in the mid and left lower abdomen small bowel with small bowel dilatation, measuring up to 4.5 cm. Moderate stool in the right and transverse colon. Tacks in the left abdominal wall from prior hernia repair. No radiopaque calculi. No acute osseous abnormalities are seen. IMPRESSION: 1. Dilated small bowel loops in the mid and left lower abdomen with air-fluid levels, suggesting partial small bowel obstruction. 2. Moderate stool burden in the right and transverse colon.  Electronically Signed   By: Jeb Levering M.D.   On: 04/21/2016 06:34      MDM   Final diagnoses:  Small bowel obstruction ([place])    Patient with diffuse abdominal pain.  Hx of SBO.  Will treat pain.    SBO seen on CT.  Abdominal series initially ordered because of delay in getting CTs overnight.  CT was performed quickly thereafter.  Patient discussed with Dr. Dina Rich, who recommends consultation with general surgery.  No NG tube at this time because patient is not vomiting.  Appreciate general surgery for admitting the patient.    Montine Circle, PA-C 04/21/16 [id number]  Merryl Hacker, MD 04/21/16 [id number]

## 2016-04-21 NOTE — ED Notes (Signed)
Patient transported to CT 

## 2016-04-21 NOTE — H&P (Signed)
Chief Complaint: abdominal pain  HPI: Matthew Hodges is a 75 y/o male with a PMHx of CVA, diverticular disease, HTN, HLD, prostate cancer, BCC and multiple SBO.  He is well known to our service due to significant PSH with Dr. Excell Seltzer and Dr. Johney Maine. PSHx includes emergency sigmoid colectomy/Hartman for perforated diverticulitis in 2000. Complicated by wound infections and incisional hernias status post colostomy takedown 2000 and onlay repair with mesh 2001. 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 Developed incisional hernia at colostomy site which was laparoscopically repaired in 2008. Most recently ventral hernia repair and lysis of adhesions in 03/2015 by Dr. Johney Maine  He presents to [facility] with sudden onset abdominal pain which started after eating cake which contained nuts around 9PM last night.  Prior to onset of symptoms, he had a bowel movement yesterday morning, appetite was great and has been doing quite well since his admission in March. No flatus. Has nausea and vomiting.  Pain is about a 4 now, alleviated by morphine.  Work up reveals, WBC 16k, sCr 1.32, k 5.2, UA negative. CT of abdomen and pelvis shows mid small bowel obstruction probably secondary to adhesions.  We have therefore been asked to evaluate.  Hospitalization for SBO: 01/2016, 12/2015, 08/2015 x2 admissions.    Past Medical History  Diagnosis Date  . Cerebral artery disease   . Diverticular disease   . Hyperlipidemia   . Hypertension     hx. of . no meds since 2004  . Prostate cancer [facility])     treated with radiation  . Basal cell carcinoma     "sqattered"  . Squamous carcinoma ([place])     "sqattered"  . Heart murmur     "after surgeries before"  . Stroke [facility]) 1979    no deficits    Past Surgical History  Procedure Laterality Date  . Partial colectomy  01/1999    Hartmans procedure with colostomy  . Small bowel repair  03/1999    postop abscesses with fistula.  Abscesses drained.  Fistula  repaired.  . Colostomy takedown  08/1999  . Incisional hernia repair  2001    open w mesh  . Laparoscopic incisional / umbilical / ventral hernia repair  2008    with mesh.  LLQ old colostomy site  . Remove suture under anesthesia  2011    stitch abscesses LLQ & midline  . Ventral hernia repair N/A 04/23/2015    Procedure: LAPAROSCOPIC VENTRAL WALL HERNIA REPAIR AND LAP LYSIS OF ADHESIONS;  Surgeon: Michael Boston, MD;  Location: [facility] ORS;  Service: General;  Laterality: N/A;  With MESH  . Lysis of adhesion N/A 04/23/2015    Procedure: LYSIS OF ADHESION;  Surgeon: Michael Boston, MD;  Location: [facility] ORS;  Service: General;  Laterality: N/A;  . Colon surgery    . Hernia repair      "lots; had diverticulitis years ago"  . Prostate biopsy  ~ 2002    History reviewed. No pertinent family history. Social History:  reports that he quit smoking about 11 years ago. His smoking use included Cigarettes. He has a 94 pack-year smoking history. He has never used smokeless tobacco. He reports that he drinks alcohol. He reports that he does not use illicit drugs.  Allergies:  Allergies  Allergen Reactions  . Latex Rash     (Not in a hospital admission)  Results for orders placed or performed during the hospital encounter of 04/21/16 (from the past 48 hour(s))  Urinalysis, Routine w reflex microscopic  Status: None   Collection Time: 04/21/16  3:32 AM  Result Value Ref Range   Color, Urine YELLOW YELLOW   APPearance CLEAR CLEAR   Specific Gravity, Urine 1.025 1.005 - 1.030   pH 5.0 5.0 - 8.0   Glucose, UA NEGATIVE NEGATIVE mg/dL   Hgb urine dipstick NEGATIVE NEGATIVE   Bilirubin Urine NEGATIVE NEGATIVE   Ketones, ur NEGATIVE NEGATIVE mg/dL   Protein, ur NEGATIVE NEGATIVE mg/dL   Nitrite NEGATIVE NEGATIVE   Leukocytes, UA NEGATIVE NEGATIVE    Comment: MICROSCOPIC NOT DONE ON URINES WITH NEGATIVE PROTEIN, BLOOD, LEUKOCYTES, NITRITE, OR GLUCOSE <1000 mg/dL.  Lipase, blood     Status: None    Collection Time: 04/21/16  3:38 AM  Result Value Ref Range   Lipase 37 11 - 51 U/L  Comprehensive metabolic panel     Status: Abnormal   Collection Time: 04/21/16  3:38 AM  Result Value Ref Range   Sodium 137 135 - 145 mmol/L   Potassium 5.2 (H) 3.5 - 5.1 mmol/L    Comment: HEMOLYSIS AT THIS LEVEL MAY AFFECT RESULT   Chloride 105 101 - 111 mmol/L   CO2 22 22 - 32 mmol/L   Glucose, Bld 160 (H) 65 - 99 mg/dL   BUN 22 (H) 6 - 20 mg/dL   Creatinine, Ser 1.32 (H) 0.61 - 1.24 mg/dL   Calcium 9.6 8.9 - 10.3 mg/dL   Total Protein 6.7 6.5 - 8.1 g/dL   Albumin 3.9 3.5 - 5.0 g/dL   AST 59 (H) 15 - 41 U/L   ALT 27 17 - 63 U/L   Alkaline Phosphatase 72 38 - 126 U/L   Total Bilirubin 1.1 0.3 - 1.2 mg/dL   GFR calc non Af Amer 51 (L) >60 mL/min   GFR calc Af Amer 60 (L) >60 mL/min    Comment: (NOTE) The eGFR has been calculated using the CKD EPI equation. This calculation has not been validated in all clinical situations. eGFR's persistently <60 mL/min signify possible Chronic Kidney Disease.    Anion gap 10 5 - 15  CBC     Status: Abnormal   Collection Time: 04/21/16  3:38 AM  Result Value Ref Range   WBC 16.3 (H) 4.0 - 10.5 K/uL   RBC 4.64 4.22 - 5.81 MIL/uL   Hemoglobin 14.2 13.0 - 17.0 g/dL   HCT 42.0 39.0 - 52.0 %   MCV 90.5 78.0 - 100.0 fL   MCH 30.6 26.0 - 34.0 pg   MCHC 33.8 30.0 - 36.0 g/dL   RDW 13.0 11.5 - 15.5 %   Platelets 231 150 - 400 K/uL   Ct Abdomen Pelvis W Contrast  04/21/2016  CLINICAL DATA:  Midline lower abdominal pain since 2200 hours last night, nausea, vomiting, constipation, abnormal abdominal radiographs with dilated small bowel loops, post colonic surgery for diverticulitis, prostatectomy for prostate cancer, hypertension, prior herniorrhaphy ease, former smoker EXAM: CT ABDOMEN AND PELVIS WITH CONTRAST TECHNIQUE: Multidetector CT imaging of the abdomen and pelvis was performed using the standard protocol following bolus administration of intravenous  contrast. Sagittal and coronal MPR images reconstructed from axial data set. CONTRAST:  ISOVUE-300 IOPAMIDOL (ISOVUE-300) INJECTION 61% IV. Oral contrast not administered. COMPARISON:  02/04/2016; abdominal radiographs 04/21/2016 FINDINGS: Lower chest: Bibasilar atelectasis. Coronary arterial calcifications. Hepatobiliary: Mildly distended gallbladder without calcification. Tiny RIGHT lobe probable hepatic cyst, stable. Liver otherwise normal appearance. No biliary dilatation Pancreas: Normal appearance Spleen: Normal appearance Adrenals/Urinary Tract: Adrenal glands normal appearance. Horseshoe kidney with small  cysts. No hydronephrosis, additional renal mass, or urinary tract calcification. Decompressed bladder. Unremarkable ureters. Stomach/Bowel: Normal appendix. Dilated proximal and decompressed distal small bowel loops compatible with mid small bowel obstruction. Transition zone appears to be at the anterior mid abdomen question adhesion. Colon decompressed with few scattered colonic diverticula. Tiny hiatal hernia, stomach otherwise unremarkable. Vascular/Lymphatic: Scattered atherosclerotic calcifications without aneurysm. No adenopathy Reproductive: N/A Other: No mass, free fluid, or free air. Suspect small BILATERAL inguinal hernias containing fat. Musculoskeletal: Bones demineralized. Scattered degenerative disc disease changes lumbar spine. IMPRESSION: Mid small bowel obstruction secondary to probable adhesion at the anterior mid abdomen. No evidence of perforation or abscess. Minimal colonic diverticulosis. Horseshoe kidney. Suspected small BILATERAL inguinal hernias. Electronically Signed   By: Lavonia Dana M.D.   On: 04/21/2016 07:53   Dg Abd Acute W/chest  04/21/2016  CLINICAL DATA:  Mid lower abdominal pain. History of small obstruction. EXAM: DG ABDOMEN ACUTE W/ 1V CHEST COMPARISON:  Most recent radiograph 02/05/2016.  CT 02/04/2016 FINDINGS: The cardiomediastinal contours are normal. Bibasilar  atelectasis or scarring. There is no free intra-abdominal air. Multiple air-fluid levels in the mid and left lower abdomen small bowel with small bowel dilatation, measuring up to 4.5 cm. Moderate stool in the right and transverse colon. Tacks in the left abdominal wall from prior hernia repair. No radiopaque calculi. No acute osseous abnormalities are seen. IMPRESSION: 1. Dilated small bowel loops in the mid and left lower abdomen with air-fluid levels, suggesting partial small bowel obstruction. 2. Moderate stool burden in the right and transverse colon. Electronically Signed   By: Jeb Levering M.D.   On: 04/21/2016 06:34    Review of Systems  Constitutional: Negative for fever, chills, weight loss, malaise/fatigue and diaphoresis.  Eyes: Negative for blurred vision, double vision, photophobia, pain, discharge and redness.  Respiratory: Negative for cough, hemoptysis, sputum production and shortness of breath.   Cardiovascular: Negative for chest pain, palpitations, orthopnea, claudication, leg swelling and PND.  Gastrointestinal: Positive for nausea, vomiting and abdominal pain. Negative for heartburn, diarrhea, constipation, blood in stool and melena.  Genitourinary: Negative for dysuria, urgency, frequency, hematuria and flank pain.  Musculoskeletal: Negative for myalgias, back pain, joint pain, falls and neck pain.  Neurological: Negative for dizziness, tingling, tremors, sensory change, speech change, focal weakness, seizures, loss of consciousness, weakness and headaches.  Psychiatric/Behavioral: Negative for depression and substance abuse. The patient is not nervous/anxious.     Blood pressure 124/69, pulse 80, temperature 97.8 F (36.6 C), resp. rate 20, height '5\' 8"'$  (1.727 m), weight 87.998 kg (194 lb), SpO2 96 %. Physical Exam  Constitutional: He is oriented to person, place, and time. He appears well-developed and well-nourished. No distress.  Cardiovascular: Normal rate, regular  rhythm, normal heart sounds and intact distal pulses.  Exam reveals no gallop and no friction rub.   No murmur heard. Respiratory: Effort normal and breath sounds normal. No respiratory distress. He has no wheezes. He has no rales. He exhibits no tenderness.  GI: Bowel sounds are normal. He exhibits no mass. There is no rebound and no guarding.  TTP periumbilical region, rlq, distended.  Musculoskeletal: Normal range of motion. He exhibits no edema or tenderness.  Neurological: He is alert and oriented to person, place, and time.  Skin: Skin is warm and dry. No rash noted. He is not diaphoretic. No erythema. No pallor.  Psychiatric: He has a normal mood and affect. His behavior is normal. Judgment and thought content normal.     Assessment/Plan  Recurrent small bowel obstructions-likely due to adhesions from previous abdominal surgeries.  No indications for urgent surgical intervention. Will admit for NGT decompression, bowel rest, IV hydration and pain control.  Start SBO protocol. ID-leukocytosis, likely reactive, AM labs FEN-IVF Dispo-admit to floor   Jeremie Giangrande, NP 04/21/2016, 8:58 AM

## 2016-04-21 NOTE — ED Notes (Signed)
Attempted to place NG tube x 2 without success.

## 2016-04-21 NOTE — ED Notes (Signed)
Patient transported to X-ray 

## 2016-04-21 NOTE — ED Notes (Signed)
Pt requesting Morphine for pain, MD to be notified.

## 2016-04-21 NOTE — ED Notes (Signed)
Attempted report x1. 

## 2016-04-21 NOTE — ED Notes (Signed)
abd pain nauseated since 2200  He has ha history of bowel blockages

## 2016-04-22 LAB — BASIC METABOLIC PANEL
ANION GAP: 5 (ref 5–15)
BUN: 23 mg/dL — ABNORMAL HIGH (ref 6–20)
CALCIUM: 8.4 mg/dL — AB (ref 8.9–10.3)
CHLORIDE: 108 mmol/L (ref 101–111)
CO2: 28 mmol/L (ref 22–32)
CREATININE: 0.96 mg/dL (ref 0.61–1.24)
GFR calc non Af Amer: >60 mL/min (ref >60)
Glucose, Bld: 120 mg/dL — ABNORMAL HIGH (ref 65–99)
Potassium: 4 mmol/L (ref 3.5–5.1)
SODIUM: 141 mmol/L (ref 135–145)

## 2016-04-22 LAB — CBC
HCT: 37.1 % — ABNORMAL LOW (ref 39.0–52.0)
Hemoglobin: 12.1 g/dL — ABNORMAL LOW (ref 13.0–17.0)
MCH: 31.3 pg (ref 26.0–34.0)
MCHC: 32.6 g/dL (ref 30.0–36.0)
MCV: 95.9 fL (ref 78.0–100.0)
PLATELETS: 206 10*3/uL (ref 150–400)
RBC: 3.87 MIL/uL — AB (ref 4.22–5.81)
RDW: 13.7 % (ref 11.5–15.5)
WBC: 5.4 10*3/uL (ref 4.0–10.5)

## 2016-04-22 MED ORDER — SODIUM CHLORIDE 0.9% FLUSH: 3.0000 mL | Freq: Two times a day (BID) | INTRAVENOUS | Status: DC

## 2016-04-22 MED ORDER — SODIUM CHLORIDE 0.9% FLUSH: 3.0000 mL | INTRAVENOUS | Status: DC | PRN

## 2016-04-22 NOTE — Progress Notes (Signed)
Discussed discharge summary with patient. Reviewed all medications with patient. Patient ready for discharge.  

## 2016-04-22 NOTE — Progress Notes (Signed)
Patient ID: Matthew Hodges, male   DOB: 1941/02/21, 75 y.o.   MRN: [id number]     [facility]      [address] [place], [place] [address]    Phone: [phone] FAX: [phone]     Subjective: Having loose BMs, likely from gastrografin.  No n/v.  Tolerated clears.  Vss.  Afebrile.  Leukocytosis has resolved.   Objective:  Vital signs:  Filed Vitals:   04/21/16 1329 04/21/16 1644 04/21/16 2134 04/22/16 0633  BP: 109/59 110/56 118/57 128/68  Pulse: 81 86 84 67  Temp: 98.5 F (36.9 C) 98.7 F (37.1 C) 97.8 F (36.6 C) 97.9 F (36.6 C)  TempSrc: Oral Oral Oral Tympanic  Resp: '18  17 19  '$ Height:      Weight:      SpO2: 97% 95% 96% 97%    Last BM Date: 04/21/16  Intake/Output   Yesterday:  05/23 0701 - 05/24 0700 In: 735 [I.V.:735] Out: 100 [Urine:100] This shift:  Total I/O In: 0  Out: 200 [Urine:200]  Physical Exam: General: Pt awake/alert/oriented x4 in no acute distress Chest: cta.  No chest wall pain w good excursion CV:  Pulses intact.  Regular rhythm Abdomen: Soft.  Nondistended.  Non tender.  No evidence of peritonitis.  No incarcerated hernias. Ext:  SCDs BLE.  No mjr edema.  No cyanosis Skin: No petechiae / purpura   Problem List:   Active Problems:   SBO (small bowel obstruction) (HCC)    Results:   Labs: Results for orders placed or performed during the hospital encounter of 04/21/16 (from the past 48 hour(s))  Urinalysis, Routine w reflex microscopic     Status: None   Collection Time: 04/21/16  3:32 AM  Result Value Ref Range   Color, Urine YELLOW YELLOW   APPearance CLEAR CLEAR   Specific Gravity, Urine 1.025 1.005 - 1.030   pH 5.0 5.0 - 8.0   Glucose, UA NEGATIVE NEGATIVE mg/dL   Hgb urine dipstick NEGATIVE NEGATIVE   Bilirubin Urine NEGATIVE NEGATIVE   Ketones, ur NEGATIVE NEGATIVE mg/dL   Protein, ur NEGATIVE NEGATIVE mg/dL   Nitrite NEGATIVE NEGATIVE   Leukocytes, UA NEGATIVE  NEGATIVE    Comment: MICROSCOPIC NOT DONE ON URINES WITH NEGATIVE PROTEIN, BLOOD, LEUKOCYTES, NITRITE, OR GLUCOSE <1000 mg/dL.  Lipase, blood     Status: None   Collection Time: 04/21/16  3:38 AM  Result Value Ref Range   Lipase 37 11 - 51 U/L  Comprehensive metabolic panel     Status: Abnormal   Collection Time: 04/21/16  3:38 AM  Result Value Ref Range   Sodium 137 135 - 145 mmol/L   Potassium 5.2 (H) 3.5 - 5.1 mmol/L    Comment: HEMOLYSIS AT THIS LEVEL MAY AFFECT RESULT   Chloride 105 101 - 111 mmol/L   CO2 22 22 - 32 mmol/L   Glucose, Bld 160 (H) 65 - 99 mg/dL   BUN 22 (H) 6 - 20 mg/dL   Creatinine, Ser 1.32 (H) 0.61 - 1.24 mg/dL   Calcium 9.6 8.9 - 10.3 mg/dL   Total Protein 6.7 6.5 - 8.1 g/dL   Albumin 3.9 3.5 - 5.0 g/dL   AST 59 (H) 15 - 41 U/L   ALT 27 17 - 63 U/L   Alkaline Phosphatase 72 38 - 126 U/L   Total Bilirubin 1.1 0.3 - 1.2 mg/dL   GFR calc non Af Amer 51 (L) >60 mL/min  GFR calc Af Amer 60 (L) >60 mL/min    Comment: (NOTE) The eGFR has been calculated using the CKD EPI equation. This calculation has not been validated in all clinical situations. eGFR's persistently <60 mL/min signify possible Chronic Kidney Disease.    Anion gap 10 5 - 15  CBC     Status: Abnormal   Collection Time: 04/21/16  3:38 AM  Result Value Ref Range   WBC 16.3 (H) 4.0 - 10.5 K/uL   RBC 4.64 4.22 - 5.81 MIL/uL   Hemoglobin 14.2 13.0 - 17.0 g/dL   HCT 42.0 39.0 - 52.0 %   MCV 90.5 78.0 - 100.0 fL   MCH 30.6 26.0 - 34.0 pg   MCHC 33.8 30.0 - 36.0 g/dL   RDW 13.0 11.5 - 15.5 %   Platelets 231 150 - 400 K/uL  Basic metabolic panel     Status: Abnormal   Collection Time: 04/22/16  5:37 AM  Result Value Ref Range   Sodium 141 135 - 145 mmol/L   Potassium 4.0 3.5 - 5.1 mmol/L    Comment: DELTA CHECK NOTED NO VISIBLE HEMOLYSIS    Chloride 108 101 - 111 mmol/L   CO2 28 22 - 32 mmol/L   Glucose, Bld 120 (H) 65 - 99 mg/dL   BUN 23 (H) 6 - 20 mg/dL   Creatinine, Ser 0.96 0.61 -  1.24 mg/dL   Calcium 8.4 (L) 8.9 - 10.3 mg/dL   GFR calc non Af Amer >60 >60 mL/min   GFR calc Af Amer >60 >60 mL/min    Comment: (NOTE) The eGFR has been calculated using the CKD EPI equation. This calculation has not been validated in all clinical situations. eGFR's persistently <60 mL/min signify possible Chronic Kidney Disease.    Anion gap 5 5 - 15  CBC     Status: Abnormal   Collection Time: 04/22/16  5:37 AM  Result Value Ref Range   WBC 5.4 4.0 - 10.5 K/uL    Comment: WHITE COUNT CONFIRMED ON SMEAR   RBC 3.87 (L) 4.22 - 5.81 MIL/uL   Hemoglobin 12.1 (L) 13.0 - 17.0 g/dL   HCT 37.1 (L) 39.0 - 52.0 %   MCV 95.9 78.0 - 100.0 fL   MCH 31.3 26.0 - 34.0 pg   MCHC 32.6 30.0 - 36.0 g/dL   RDW 13.7 11.5 - 15.5 %   Platelets 206 150 - 400 K/uL    Imaging / Studies: Ct Abdomen Pelvis W Contrast  04/21/2016  CLINICAL DATA:  Midline lower abdominal pain since 2200 hours last night, nausea, vomiting, constipation, abnormal abdominal radiographs with dilated small bowel loops, post colonic surgery for diverticulitis, prostatectomy for prostate cancer, hypertension, prior herniorrhaphy ease, former smoker EXAM: CT ABDOMEN AND PELVIS WITH CONTRAST TECHNIQUE: Multidetector CT imaging of the abdomen and pelvis was performed using the standard protocol following bolus administration of intravenous contrast. Sagittal and coronal MPR images reconstructed from axial data set. CONTRAST:  ISOVUE-300 IOPAMIDOL (ISOVUE-300) INJECTION 61% IV. Oral contrast not administered. COMPARISON:  02/04/2016; abdominal radiographs 04/21/2016 FINDINGS: Lower chest: Bibasilar atelectasis. Coronary arterial calcifications. Hepatobiliary: Mildly distended gallbladder without calcification. Tiny RIGHT lobe probable hepatic cyst, stable. Liver otherwise normal appearance. No biliary dilatation Pancreas: Normal appearance Spleen: Normal appearance Adrenals/Urinary Tract: Adrenal glands normal appearance. Horseshoe kidney with  small cysts. No hydronephrosis, additional renal mass, or urinary tract calcification. Decompressed bladder. Unremarkable ureters. Stomach/Bowel: Normal appendix. Dilated proximal and decompressed distal small bowel loops compatible with mid small bowel obstruction.  Transition zone appears to be at the anterior mid abdomen question adhesion. Colon decompressed with few scattered colonic diverticula. Tiny hiatal hernia, stomach otherwise unremarkable. Vascular/Lymphatic: Scattered atherosclerotic calcifications without aneurysm. No adenopathy Reproductive: N/A Other: No mass, free fluid, or free air. Suspect small BILATERAL inguinal hernias containing fat. Musculoskeletal: Bones demineralized. Scattered degenerative disc disease changes lumbar spine. IMPRESSION: Mid small bowel obstruction secondary to probable adhesion at the anterior mid abdomen. No evidence of perforation or abscess. Minimal colonic diverticulosis. Horseshoe kidney. Suspected small BILATERAL inguinal hernias. Electronically Signed   By: Lavonia Dana M.D.   On: 04/21/2016 07:53   Dg Abd Acute W/chest  04/21/2016  CLINICAL DATA:  Mid lower abdominal pain. History of small obstruction. EXAM: DG ABDOMEN ACUTE W/ 1V CHEST COMPARISON:  Most recent radiograph 02/05/2016.  CT 02/04/2016 FINDINGS: The cardiomediastinal contours are normal. Bibasilar atelectasis or scarring. There is no free intra-abdominal air. Multiple air-fluid levels in the mid and left lower abdomen small bowel with small bowel dilatation, measuring up to 4.5 cm. Moderate stool in the right and transverse colon. Tacks in the left abdominal wall from prior hernia repair. No radiopaque calculi. No acute osseous abnormalities are seen. IMPRESSION: 1. Dilated small bowel loops in the mid and left lower abdomen with air-fluid levels, suggesting partial small bowel obstruction. 2. Moderate stool burden in the right and transverse colon. Electronically Signed   By: Jeb Levering M.D.    On: 04/21/2016 06:34   Dg Abd Portable 1v-small Bowel Obstruction Protocol-initial, 8 Hr Delay  04/21/2016  CLINICAL DATA:  8 hour radiographs status post administration of oral contrast. Follow-up small bowel obstruction. EXAM: PORTABLE ABDOMEN - 1 VIEW COMPARISON:  CT of the abdomen and pelvis performed earlier today at 7:09 a.m. FINDINGS: Contrast progresses to the colon and rectum. There is no evidence for bowel obstruction. Air-filled small bowel loops measure up to 5.1 cm, raising question for mild underlying dysmotility. No free intra-abdominal air is seen, though evaluation for free air is limited on a single supine view. No acute osseous abnormalities are identified. An anterior abdominal wall mesh is noted. IMPRESSION: 1. Contrast progresses to the colon and rectum. No evidence for bowel obstruction. 2. Air-filled small bowel loops measure up to 5.1 cm, raising question for mild underlying dysmotility. No free intra-abdominal air seen. Electronically Signed   By: Garald Balding M.D.   On: 04/21/2016 23:21    Medications / Allergies:  Scheduled Meds: . enoxaparin (LOVENOX) injection  40 mg Subcutaneous Q24H   Continuous Infusions: . dextrose 5 % and 0.9% NaCl 75 mL/hr at 04/21/16 1057   PRN Meds:.morphine injection, ondansetron **OR** ondansetron (ZOFRAN) IV  Antibiotics: Anti-infectives    None        Assessment/Plan Recurrent small bowel obstructions-resolved clinically and radiologically.  He will need to follow up with Dr. Johney Maine given repeated admissions.  He has tolerated clears, advance to fulls for lunch.  Wants to be discharged today, but will discuss with Dr. Ninfa Linden and see how does with the fulls as well.  VTE prophylaxis-SCD, lovenox  FEN-sliv.  Dispo-possible DC later today.     Erby Pian, [facility] Surgery Pager [phone]) For consults and floor pages call [phone]([id number])  04/22/2016

## 2016-04-22 NOTE — Discharge Summary (Signed)
Physician Discharge Summary  Matthew Hodges [id number] DOB: 03/27/1941 DOA: 04/21/2016  PCP: Marjorie Smolder, MD   Consultation: none  Admit date: 04/21/2016 Discharge date: 04/22/2016  Recommendations for Outpatient Follow-up:    Follow-up Information    Follow up with GROSS,STEVEN C., MD In 1 month.   Specialty:  General Surgery   Why:  multiple bowel obstructions    Contact information:   [place] [address] [phone]      Discharge Diagnoses:  1. Recurrent small bowel obstructions   Surgical Procedure: none  Discharge Condition: stable Disposition: home  Diet recommendation: regular  Filed Weights   04/21/16 0318  Weight: 87.998 kg (194 lb)     HPI: Matthew Hodges is a 75 y/o male with a PMHx of CVA, diverticular disease, HTN, HLD, prostate cancer, BCC and multiple SBO. He is well known to our service due to significant PSH with Dr. Excell Seltzer and Dr. Johney Maine. PSHx includes emergency sigmoid colectomy/[facility] for perforated diverticulitis in 2000. Complicated by wound infections and incisional hernias status post colostomy takedown 2000 and onlay repair with mesh 2001. 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 Developed incisional hernia at colostomy site which was laparoscopically repaired in 2008. Most recently ventral hernia repair and lysis of adhesions in 03/2015 by Dr. Johney Maine  He presents to [facility] with sudden onset abdominal pain which started after eating cake which contained nuts around 9PM last night. Prior to onset of symptoms, he had a bowel movement yesterday morning, appetite was great and has been doing quite well since his admission in March. No flatus. Has nausea and vomiting. Pain is about a 4 now, alleviated by morphine. Work up reveals, WBC 16k, sCr 1.32, k 5.2, UA negative. CT of abdomen and pelvis shows mid small bowel obstruction probably secondary to adhesions. We have therefore been asked  to evaluate.  Hospitalization for SBO: 01/2016, 12/2015, 08/2015 x2 admissions.  Hospital Course:  NGT was unable to be placed after several attempts.  He was given the gastrografin contrast PO.  Follow up 8 hour films showed contrast in the colon.  He was started on clears and diet was advanced. He also began having bowel movements and pain had resolved.  He was therefore felt stable for discharge.  He was once again asked to follow up with Dr. Johney Maine given recurrent hospitalization.  He knows to return to the ED should his symptoms return.     Discharge Instructions     Medication List    TAKE these medications        aspirin EC 81 MG tablet  Take 81 mg by mouth every morning.     atorvastatin 40 MG tablet  Commonly known as:  LIPITOR  Take 20 mg by mouth at bedtime.     docusate sodium 100 MG capsule  Commonly known as:  COLACE  Take 1 capsule (100 mg total) by mouth 2 (two) times daily.     MEGA MULTIVITAMIN FOR MEN PO  Take 1 tablet by mouth every morning.     mirabegron ER 25 MG Tb24 tablet  Commonly known as:  MYRBETRIQ  Take 25 mg by mouth every morning.     omega-3 acid ethyl esters 1 g capsule  Commonly known as:  LOVAZA  Take 1 g by mouth every morning.     Vitamin D3 400 units tablet  Take 400 Units by mouth every morning.           Follow-up  Information    Follow up with GROSS,STEVEN C., MD In 1 month.   Specialty:  General Surgery   Why:  multiple bowel obstructions    Contact information:   [place] [address] [phone]        The results of significant diagnostics from this hospitalization (including imaging, microbiology, ancillary and laboratory) are listed below for reference.    Significant Diagnostic Studies: Ct Abdomen Pelvis W Contrast  04/21/2016  CLINICAL DATA:  Midline lower abdominal pain since 2200 hours last night, nausea, vomiting, constipation, abnormal abdominal radiographs with dilated small  bowel loops, post colonic surgery for diverticulitis, prostatectomy for prostate cancer, hypertension, prior herniorrhaphy ease, former smoker EXAM: CT ABDOMEN AND PELVIS WITH CONTRAST TECHNIQUE: Multidetector CT imaging of the abdomen and pelvis was performed using the standard protocol following bolus administration of intravenous contrast. Sagittal and coronal MPR images reconstructed from axial data set. CONTRAST:  ISOVUE-300 IOPAMIDOL (ISOVUE-300) INJECTION 61% IV. Oral contrast not administered. COMPARISON:  02/04/2016; abdominal radiographs 04/21/2016 FINDINGS: Lower chest: Bibasilar atelectasis. Coronary arterial calcifications. Hepatobiliary: Mildly distended gallbladder without calcification. Tiny RIGHT lobe probable hepatic cyst, stable. Liver otherwise normal appearance. No biliary dilatation Pancreas: Normal appearance Spleen: Normal appearance Adrenals/Urinary Tract: Adrenal glands normal appearance. Horseshoe kidney with small cysts. No hydronephrosis, additional renal mass, or urinary tract calcification. Decompressed bladder. Unremarkable ureters. Stomach/Bowel: Normal appendix. Dilated proximal and decompressed distal small bowel loops compatible with mid small bowel obstruction. Transition zone appears to be at the anterior mid abdomen question adhesion. Colon decompressed with few scattered colonic diverticula. Tiny hiatal hernia, stomach otherwise unremarkable. Vascular/Lymphatic: Scattered atherosclerotic calcifications without aneurysm. No adenopathy Reproductive: N/A Other: No mass, free fluid, or free air. Suspect small BILATERAL inguinal hernias containing fat. Musculoskeletal: Bones demineralized. Scattered degenerative disc disease changes lumbar spine. IMPRESSION: Mid small bowel obstruction secondary to probable adhesion at the anterior mid abdomen. No evidence of perforation or abscess. Minimal colonic diverticulosis. Horseshoe kidney. Suspected small BILATERAL inguinal hernias.  Electronically Signed   By: Lavonia Dana M.D.   On: 04/21/2016 07:53   Dg Abd Acute W/chest  04/21/2016  CLINICAL DATA:  Mid lower abdominal pain. History of small obstruction. EXAM: DG ABDOMEN ACUTE W/ 1V CHEST COMPARISON:  Most recent radiograph 02/05/2016.  CT 02/04/2016 FINDINGS: The cardiomediastinal contours are normal. Bibasilar atelectasis or scarring. There is no free intra-abdominal air. Multiple air-fluid levels in the mid and left lower abdomen small bowel with small bowel dilatation, measuring up to 4.5 cm. Moderate stool in the right and transverse colon. Tacks in the left abdominal wall from prior hernia repair. No radiopaque calculi. No acute osseous abnormalities are seen. IMPRESSION: 1. Dilated small bowel loops in the mid and left lower abdomen with air-fluid levels, suggesting partial small bowel obstruction. 2. Moderate stool burden in the right and transverse colon. Electronically Signed   By: Jeb Levering M.D.   On: 04/21/2016 06:34   Dg Abd Portable 1v-small Bowel Obstruction Protocol-initial, 8 Hr Delay  04/21/2016  CLINICAL DATA:  8 hour radiographs status post administration of oral contrast. Follow-up small bowel obstruction. EXAM: PORTABLE ABDOMEN - 1 VIEW COMPARISON:  CT of the abdomen and pelvis performed earlier today at 7:09 a.m. FINDINGS: Contrast progresses to the colon and rectum. There is no evidence for bowel obstruction. Air-filled small bowel loops measure up to 5.1 cm, raising question for mild underlying dysmotility. No free intra-abdominal air is seen, though evaluation for free air is limited on a single  supine view. No acute osseous abnormalities are identified. An anterior abdominal wall mesh is noted. IMPRESSION: 1. Contrast progresses to the colon and rectum. No evidence for bowel obstruction. 2. Air-filled small bowel loops measure up to 5.1 cm, raising question for mild underlying dysmotility. No free intra-abdominal air seen. Electronically Signed   By:  Garald Balding M.D.   On: 04/21/2016 23:21    Microbiology: No results found for this or any previous visit (from the past 240 hour(s)).   Labs: Basic Metabolic Panel:  Recent Labs Lab 04/21/16 0338 04/22/16 0537  NA 137 141  K 5.2* 4.0  CL 105 108  CO2 22 28  GLUCOSE 160* 120*  BUN 22* 23*  CREATININE 1.32* 0.96  CALCIUM 9.6 8.4*   Liver Function Tests:  Recent Labs Lab 04/21/16 0338  AST 59*  ALT 27  ALKPHOS 72  BILITOT 1.1  PROT 6.7  ALBUMIN 3.9    Recent Labs Lab 04/21/16 0338  LIPASE 37   No results for input(s): AMMONIA in the last 168 hours. CBC:  Recent Labs Lab 04/21/16 0338 04/22/16 0537  WBC 16.3* 5.4  HGB 14.2 12.1*  HCT 42.0 37.1*  MCV 90.5 95.9  PLT 231 206   Cardiac Enzymes: No results for input(s): CKTOTAL, CKMB, CKMBINDEX, TROPONINI in the last 168 hours. BNP: BNP (last 3 results) No results for input(s): BNP in the last 8760 hours.  ProBNP (last 3 results) No results for input(s): PROBNP in the last 8760 hours.  CBG: No results for input(s): GLUCAP in the last 168 hours.  Active Problems:   SBO (small bowel obstruction) ([place])   Time coordinating discharge: <30 mins   Signed:  Kiley Solimine, ANP-BC

## 2016-04-22 NOTE — Discharge Instructions (Signed)
Small Bowel Obstruction °A small bowel obstruction is a blockage in the small bowel. The small bowel, which is also called the small intestine, is a long, slender tube that connects the stomach to the colon. When a person eats and drinks, food and fluids go from the stomach to the small bowel. This is where most of the nutrients in the food and fluids are absorbed. °A small bowel obstruction will prevent food and fluids from passing through the small bowel as they normally do during digestion. The small bowel can become partially or completely blocked. This can cause symptoms such as abdominal pain, vomiting, and bloating. If this condition is not treated, it can be dangerous because the small bowel could rupture. °CAUSES °Common causes of this condition include: °· Scar tissue from previous surgery or radiation treatment. °· Recent surgery. This may cause the movements of the bowel to slow down and cause food to block the intestine. °· Hernias. °· Inflammatory bowel disease (colitis). °· Twisting of the bowel (volvulus). °· Tumors. °· A foreign body. °· Slipping of a part of the bowel into another part (intussusception). °SYMPTOMS °Symptoms of this condition include: °· Abdominal pain. This may be dull cramps or sharp pain. It may occur in one area, or it may be present in the entire abdomen. Pain can range from mild to severe, depending on the degree of obstruction. °· Nausea and vomiting. Vomit may be greenish or a yellow bile color. °· Abdominal bloating. °· Constipation. °· Lack of passing gas. °· Frequent belching. °· Diarrhea. This may occur if the obstruction is partial and runny stool is able to leak around the obstruction. °DIAGNOSIS °This condition may be diagnosed based on a physical exam, medical history, and X-rays of the abdomen. You may also have other tests, such as a CT scan of the abdomen and pelvis. °TREATMENT °Treatment for this condition depends on the cause and severity of the problem.  Treatment options may include: °· Bed rest along with fluids and pain medicines that are given through an IV tube inserted into one of your veins. Sometimes, this is all that is needed for the obstruction to improve. °· Following a simple diet. In some cases, a clear liquid diet may be required for several days. This allows the bowel to rest. °· Placement of a small tube (nasogastric tube) into the stomach. When the bowel is blocked, it usually swells up like a balloon that is filled with air and fluids. The air and fluids may be removed by suction through the nasogastric tube. This can help with pain, discomfort, and nausea. It can also help the obstruction to clear up faster. °· Surgery. This may be required if other treatments do not work. Bowel obstruction from a hernia may require early surgery and can be an emergency procedure. Surgery may also be required for scar tissue that causes frequent or severe obstructions. °HOME CARE INSTRUCTIONS °· Get plenty of rest. °· Follow instructions from your health care provider about eating restrictions. You may need to avoid solid foods and consume only clear liquids until your condition improves. °· Take over-the-counter and prescription medicines only as told by your health care provider. °· Keep all follow-up visits as told by your health care provider. This is important. °SEEK MEDICAL CARE IF: °· You have a fever. °· You have chills. °SEEK IMMEDIATE MEDICAL CARE IF: °· You have increased pain or cramping. °· You vomit blood. °· You have uncontrolled vomiting or nausea. °· You cannot drink   fluids because of vomiting or pain. °· You develop confusion. °· You begin feeling very dry or thirsty (dehydrated). °· You have severe bloating. °· You feel extremely weak or you faint. °  °This information is not intended to replace advice given to you by your health care provider. Make sure you discuss any questions you have with your health care provider. °  °Document Released:  02/02/2006 Document Revised: 08/07/2015 Document Reviewed: 01/10/2015 °Elsevier Interactive Patient Education ©2016 [employer] ° ° °

## 2016-05-05 ENCOUNTER — Ambulatory Visit: Payer: Self-pay | Admitting: Surgery

## 2016-05-05 NOTE — H&P (Signed)
Matthew Hodges 05/05/2016 10:02 AM Location: [facility] Patient #: [id number] DOB: [birth year] Married / Language: Matthew Hodges / Race: White Male  History of Present Illness Matthew Hector MD; 05/05/2016 1:31 PM) The patient is a 75 year old male who presents with small bowel obstruction. Note for "Small bowel obstruction": Patient returns after her admission for small bowel obstruction.  Pleasant elderly gentleman with numerous surgeries. Had ruptured diverticulitis that required colostomy. Eventually takedown. Developed recurrent stitch abscesses and hernia repairs. Had re-repair laparoscopically of recurrent incisional hernias May 2016. He developed small bowel obstruction in October 2016 that resolved quickly nonoperatively. He had a recurrent small bowel obstruction in January. I saw him. He declined any intervention. He is gone back to be readmitted for bowel obstructions in March. Again in May. Because of the recurrent bowel obstructions, he comes in today to see if elective lysis of adhesions was required. Patient has since he's probably had at least 5 episodes since repair of his recurrent incisional hernias. Year ago. Usually the bowel obstructions resolve within a few days in the hospital nonoperatively.  He comes today with his wife. He feels well. Eating solid diet. No nausea or vomiting. Food diet paternity regular food now. Energy level good. Appetite good. Moving bowels twice a day. He tries to eat smaller meals. Chews well. Drinks any liquids. He's had issues even with just being on full liquids. He does not feel any new lumps or hernias. No problems with skin stitch abscesses anymore.      SURGERY 04/23/2015  PATIENT: Matthew Hodges 75 y.o. male  Patient Care Team: Matthew Austin, MD as PCP - General (Family Medicine) Matthew Brownie, MD (Dermatology) Matthew Clines, MD as Attending Physician (Urology)  PRE-OPERATIVE DIAGNOSIS:  Incisonal Ventral Wall Abdominal Hernia  POST-OPERATIVE DIAGNOSIS: Incisonal Ventral Wall Abdominal Hernia AND ADHESIONS  PROCEDURE:  LAPAROSCOPIC VENTRAL WALL HERNIA REPAIR with mesh LAPAROSCOPIC LYSIS OF ADHESIONS X 2 HOURS (66% of case)  SURGEON: Surgeon(s): Matthew Boston, MD  OR FINDINGS:  Swiss cheese hernias noted an central region. Prior onlay polypropylene mesh with holes within the mesh. Those are the most obvious thinned out areas. 12x8 cm swiss cheese region. Moderate diastases/hernation in central abdomen 21 x 17 cm region. Prior left lower quadrant colostomy hernia repair intact with Parietex mesh.  Very dense adhesions of small bowel & colon to mesh.Greater omentum stuck in left upper quadrant.    Type of repair - Laparoscopic underlay repair  Name of mesh - Bard Ventralight dual sided (polypropylene / Seprafilm)  Size of mesh - Length 33 cm, Width 28 cm  Mesh overlap - 5-7 cm  Placement of mesh - Intraperitoneal underlay repair   Problem List/Past Medical Matthew Hector, MD; 05/05/2016 10:09 AM) RECURRENT VENTRAL INCISIONAL HERNIA (K43.2) ERYTHEMA OF ABDOMINAL WALL (L53.9) SMALL BOWEL OBSTRUCTION DUE TO ADHESIONS (K56.5)  Other Problems Matthew Hector, MD; 05/05/2016 10:09 AM) Alcohol Abuse Bladder Problems Cerebrovascular Accident Hypercholesterolemia Melanoma Prostate Cancer  Past Surgical History Matthew Hector, MD; 05/05/2016 10:09 AM) Colon Removal - Partial Resection of Stomach COLECTOMY WITH END COLOSTOMY [id number]) 01/1999 Perf diverticulitis EXPLORATORY LAPAROTOMY ([id number]) 03/1999 Exploratory laparotomy, drainage of abscesses, repair small bowel fistula. COLOSTOMY TAKEDOWN [phone]) 08/1999 Dr Excell Seltzer OPEN REPAIR, HERNIA, ABDOMINAL ([id number]) 02/2000 Open repair of midline & LLQ hwernias with mesh. Dr Excell Seltzer LAPAROSCOPIC REPAIR, Harvey, REDUCIBLE [phone]) 2008 PHYSICIAN: Matthew Hector, MD DATE OF  BIRTH: 1941/09/06  DATE OF PROCEDURE: 09/02/2007 DATE OF DISCHARGE:   OPERATIVE REPORT  PRIMARY CARE PHYSICIAN: Matthew Hodges, M.D.  SURGEON: Matthew Hodges, M.D.  ASSISTANT: Matthew Hodges. Matthew Hodges, M.D., as well as Matthew Hodges, P.A. student.  PREOPERATIVE DIAGNOSIS: History of prior abdominal surgeries and ventral hernia with paracolostomy hernia, possible midline ventral hernia.  POSTOPERATIVE DIAGNOSES: 1. Left lower quadrant incarcerated ventral hernia from old colostomy  site, 9 x 14 cm in size. 2. Extensive abdominal wall adhesions to anterior abdominal wall. 3. No evidence of any significant ventral hernia.  PROCEDURE PERFORMED: 1. Laparoscopic lysis of adhesions x150 minutes (equals 75%). 2. Laparoscopic ventral hernia repair with 20 x 30 cm  Parietex/Seprafilm dual-sided mesh. OPERATIVE FINDINGS: He had very dense adhesions onto his midline involving numerous loops of small intestine and omentum. He had numerous adhesions on his left lateral abdomen as well of omentum along with incarcerated hernia through his colostomy site about 9 x 14 cm in size with omentum and small intestine within it. He had no strong evidence of a midline incisional hernia with prior mesh intact. REMOVAL, SUTURE, WITH ANESTHESIA ([id number]) 2011 Removal of stitch abscesses at old LLQ colostomy site and midline  Diagnostic Studies History Matthew Hector, MD; 05/05/2016 10:09 AM) Colonoscopy >10 years ago  Allergies Elbert Ewings, CMA; 05/05/2016 10:02 AM) Latex Exam Gloves *MEDICAL DEVICES AND SUPPLIES*  Medication History Elbert Ewings, CMA; 05/05/2016 10:03 AM) Lovaza (1GM Capsule, Oral) Active. Medications Reconciled  Social History Matthew Hector, MD; 05/05/2016 10:09 AM) Alcohol use Remotely quit alcohol use. Caffeine use Coffee. No drug use Tobacco use Former smoker.  Family History Matthew Hector, MD; 05/05/2016 10:09 AM) Alcohol Abuse  Brother, Father, Sister. Depression Sister.    Vitals Elbert Ewings CMA; 05/05/2016 10:03 AM) 05/05/2016 10:03 AM Weight: 198 lb Height: 68in Body Surface Area: 2.04 m Body Mass Index: 30.11 kg/m  Temp.: 97.48F(Temporal)  Pulse: 86 (Regular)  BP: 128/78 (Sitting, Left Arm, Standard)      Physical Exam Matthew Hector MD; 05/05/2016 1:31 PM)  General Mental Status-Alert. General Appearance-Not in acute distress. Voice-Normal.  Integumentary Global Assessment Normal Exam - Distribution of scalp and body hair is normal. General Characteristics Overall Skin Surface - no rashes and no suspicious lesions.  Head and Neck Head-normocephalic, atraumatic with no lesions or palpable masses. Face Global Assessment - atraumatic, no absence of expression. Neck Global Assessment - no abnormal movements, no decreased range of motion. Trachea-midline. Thyroid Gland Characteristics - non-tender.  Eye Eyeball - Left-Extraocular movements intact, No Nystagmus. Eyeball - Right-Extraocular movements intact, No Nystagmus. Upper Eyelid - Left-No Cyanotic. Upper Eyelid - Right-No Cyanotic. Note: Wears glasses. Vision acceptable  Chest and Lung Exam Inspection Accessory muscles - No use of accessory muscles in breathing.  Abdomen Note: Abdomen obese but doughy soft. No pain or guarding. No peritonitis. No drainage. A few subcutaneous stitches can be felt at old colostomy site but no drainage or erythema or pain. No hernias  Male Genitourinary Note: No inguinal hernias. No drainage.  Peripheral Vascular Upper Extremity Inspection - Left - Not Gangrenous, No Petechiae. Right - Not Gangrenous, No Petechiae.  Neurologic Neurologic evaluation reveals -normal attention span and ability to concentrate, able to name objects and repeat phrases. Appropriate fund of knowledge and normal coordination.  Neuropsychiatric Mental status exam performed with  findings of-able to articulate well with normal speech/language, rate, volume and coherence and no evidence of hallucinations, delusions, obsessions or homicidal/suicidal ideation. Orientation-oriented X3.  Musculoskeletal Global Assessment Gait and Station - normal gait and station.  Lymphatic General Lymphatics Description - No  Generalized lymphadenopathy.    Assessment & Plan Matthew Hector MD; 05/05/2016 10:42 AM)  SMALL BOWEL OBSTRUCTION DUE TO ADHESIONS (K56.5) Impression: Recurrent small bowel obstructions in October, January, March, May  A long discussion with the patient is wife. I think he requires diagnostic laparoscopy with lysis of adhesions. Make sure he doesn't have a stricture that required bowel resection. He's had numerous surgeries and significant adhesions, so risks of bowel enterotomy and other issues are elevated, but he is having readmissions every 2 months now so I don't think things are coming down. He is at the point where he agrees and needs to break the cycle of surgery. His wife is having back surgery next week though, so NI like to wait a few weeks so that she isn't a better place to help him out. They also may be able to get their son to come in and help as well. I strongly recommend that they have plan be in support to help him out.  I recommend continuing well chewed frequent smaller meals. He is tried to do liquids and. Foods when he feels obstructions but still goes to the ER. Avoid severe constipation or diarrhea. He was hoping I could direct admit him if he gets another SBO. I can see if we can try, but sometimes a challenge to get people admitted right away, especially if they have issues after hours.  Current Plans Pt Education - Adhesions: discussed with patient and provided information. Pt Education - Patient education: Small bowel obstruction (The Basics): discussed with patient and provided information. Pt Education - [facility] Good Bowel Health  ([place]) You are being scheduled for surgery - Our schedulers will call you.  You should hear from our office's scheduling department within 5 working days about the location, date, and time of surgery. We try to make accommodations for patient's preferences in scheduling surgery, but sometimes the OR schedule or the surgeon's schedule prevents [place] from making those accommodations.  If you have not heard from our office [phone]) in 5 working days, call the office and ask for your surgeon's nurse.  If you have other questions about your diagnosis, plan, or surgery, call the office and ask for your surgeon's nurse.  The anatomy & physiology of the digestive tract was discussed. The pathophysiology of intestinal obstruction was discussed. Natural history risks without surgery was discussed. Differential diagnosis discuseed. I feel the patient has failed non-operative therapies. The risks of no intervention will lead to serious problems such as necrosis, perforation, dehydration, etc. that outweigh the operative risks; therefore, I recommended abdominal exploration to diagnose & treat the source of the problem. Minimally Invasive & open techniques were discussed. I expressed a good likelihood that surgery will treat the problem.  Risks such as bleeding, infection, abscess, leak, reoperation, bowel resection, possible ostomy, hernia, heart attack, death, and other risks were discussed. I noted a good likelihood this will help address the problem. Goals of post-operative recovery were discussed as well. We will work to minimize complications. Questions were answered. The patient expresses understanding & wishes to proceed with surgery.  Pt Education - [facility] Colectomy post-op instructions: discussed with patient and provided information.  Matthew Hodges, M.D., F.A.C.S. Gastrointestinal and Minimally Invasive Surgery [facility] [address], [place], [place]  [address] [phone] Main / Paging

## 2016-06-13 IMAGING — CT CT ABD-PELV W/ CM
2 of 5 series · 16 of 46 positions shown, 18 images · IV contrast (APPLIED)
Comparison: 02/04/2016; abdominal radiographs 04/21/2016

CLINICAL DATA: Midline lower abdominal pain since 1100 hours last
night, nausea, vomiting, constipation, abnormal abdominal
radiographs with dilated small bowel loops, post colonic surgery for
diverticulitis, prostatectomy for prostate cancer, hypertension,
prior herniorrhaphy ease, former smoker

EXAM:
CT ABDOMEN AND PELVIS WITH CONTRAST
TECHNIQUE: Multidetector CT imaging of the abdomen and pelvis was performed
using the standard protocol following bolus administration of
intravenous contrast. Sagittal and coronal MPR images reconstructed
from axial data set.
CONTRAST:  7EDA19-MOO IOPAMIDOL (7EDA19-MOO) INJECTION 61% IV. Oral
contrast not administered.

[Series 2: abd/ pelvis 5.0 i30f 1 · axial · 0.93mm/px · z∈[+775,+1210]mm · 13 of 97 slices shown, 15 images]
[im 5/97  soft-tissue]
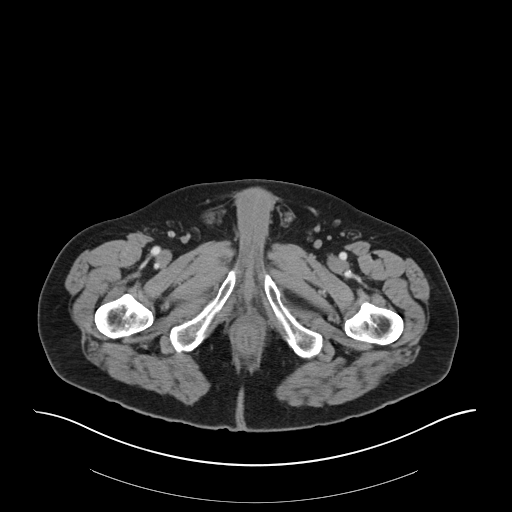
[im 5/97  bone]
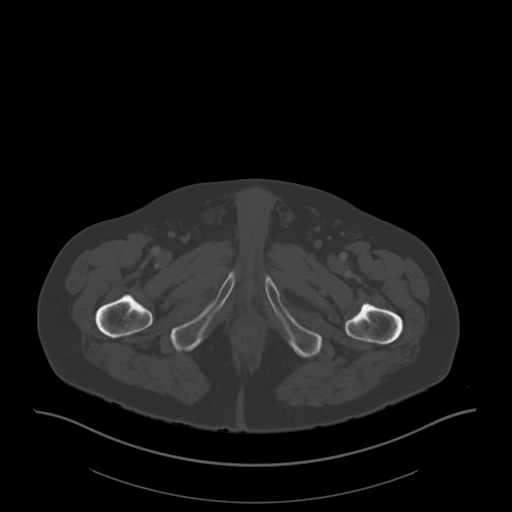
[im 14/97  soft-tissue]
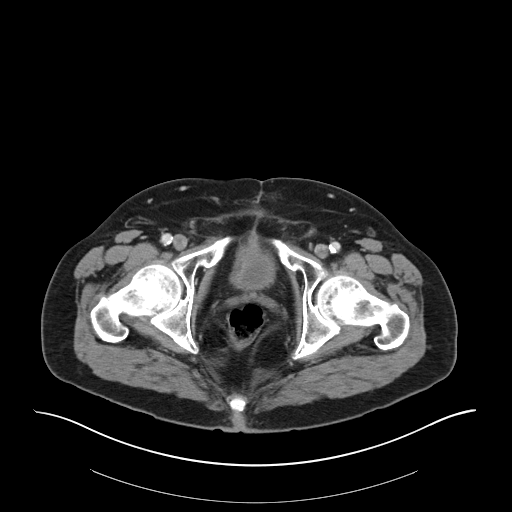
[im 19/97  soft-tissue]
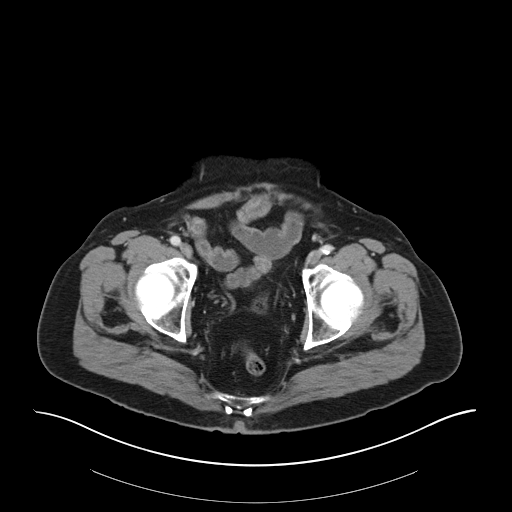
[im 28/97  soft-tissue]
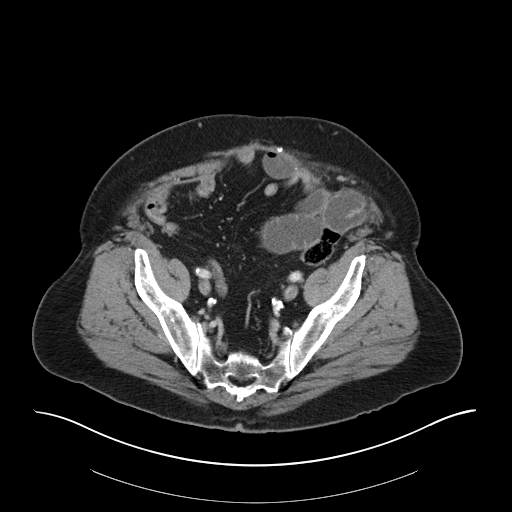
[im 33/97  soft-tissue]
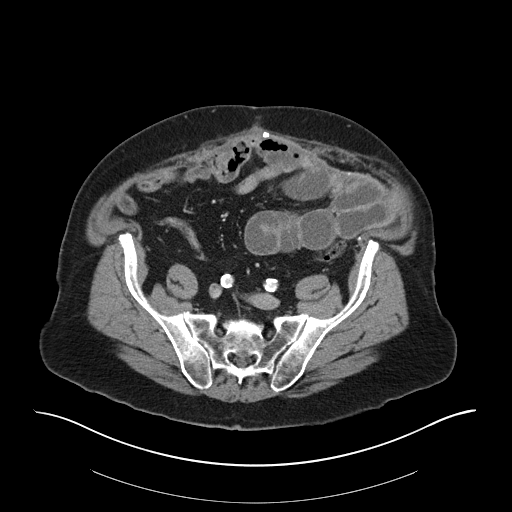
[im 42/97  soft-tissue]
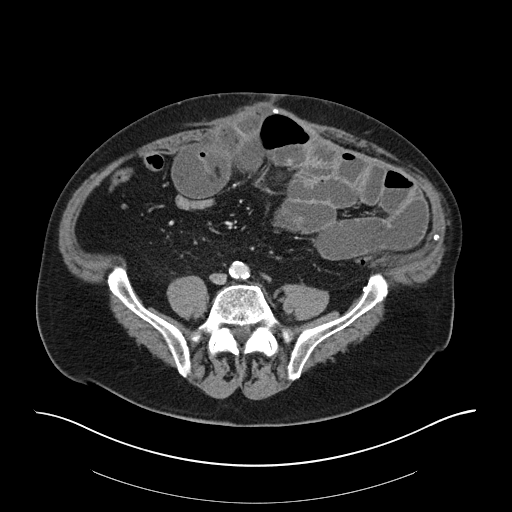
[im 51/97  soft-tissue]
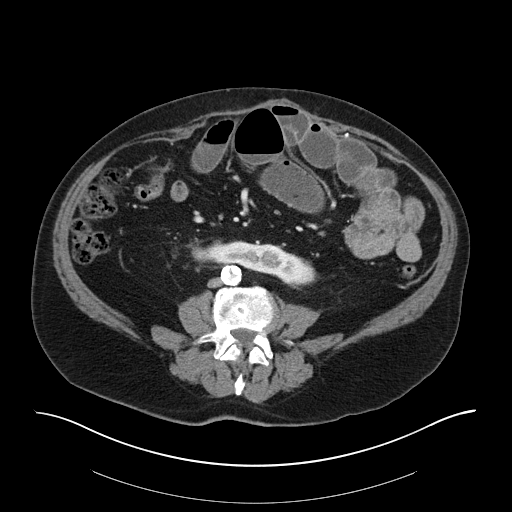
[im 55/97  soft-tissue]
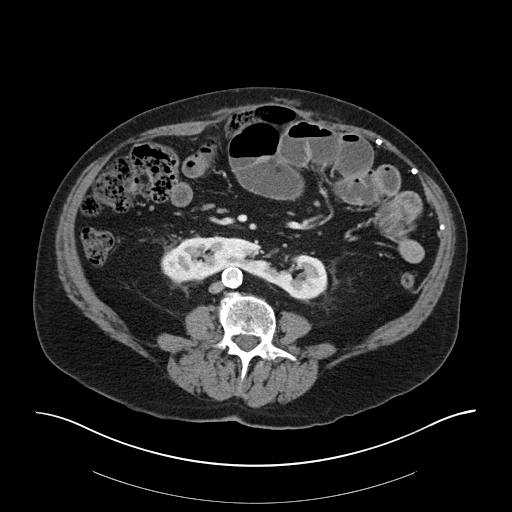
[im 65/97  soft-tissue]
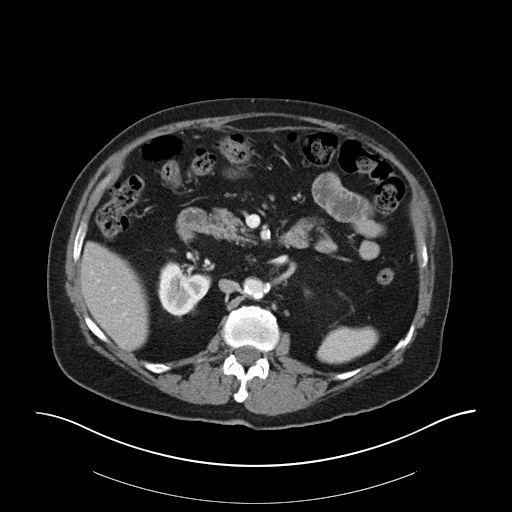
[im 65/97  bone]
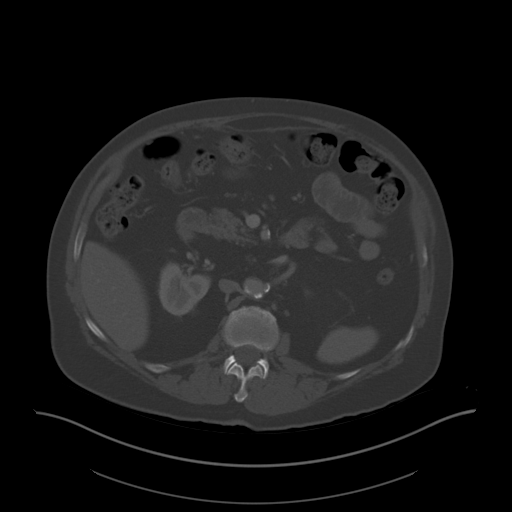
[im 69/97  soft-tissue]
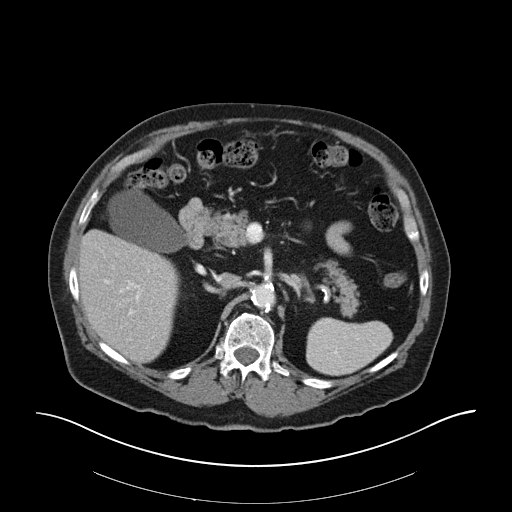
[im 78/97  soft-tissue]
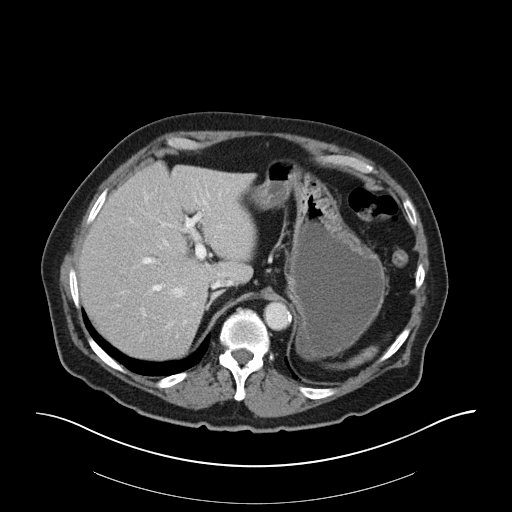
[im 83/97  soft-tissue]
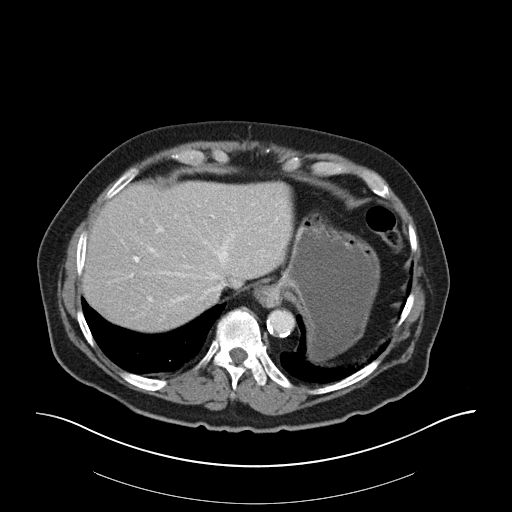
[im 92/97  soft-tissue]
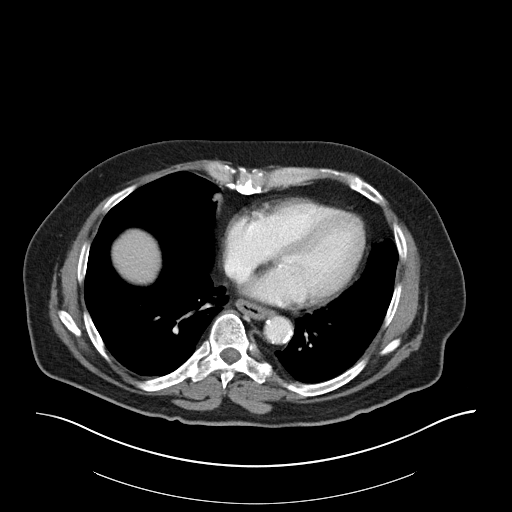

[Series 5: coronal soft tissue · coronal · 0.94mm/px · 3 of 101 slices shown]
[im 34/101  soft-tissue]
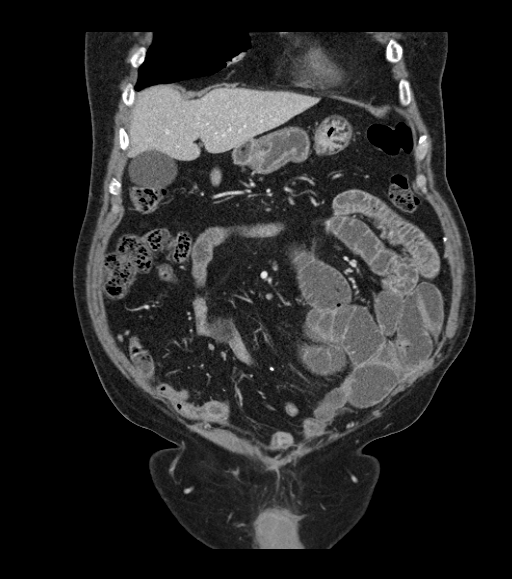
[im 45/101  soft-tissue]
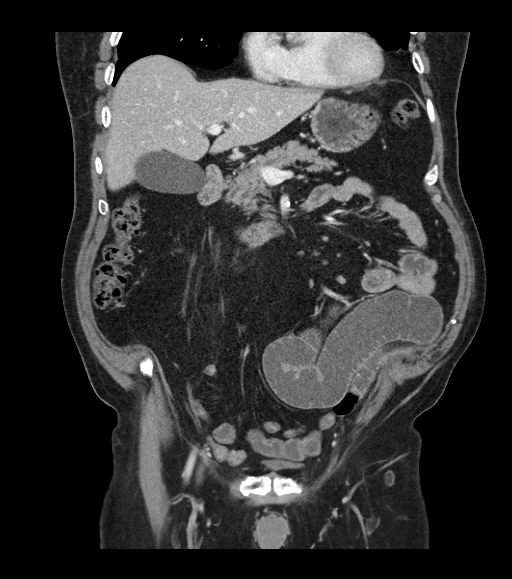
[im 56/101  soft-tissue]
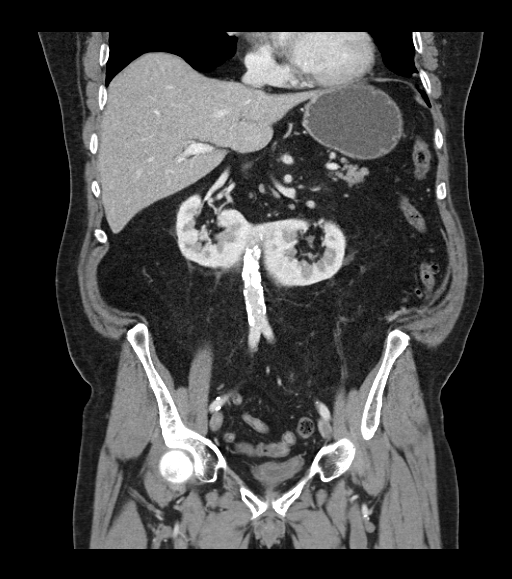

[16 of 46 positions shown; findings below may reference images not displayed]

FINDINGS: Lower chest: Bibasilar atelectasis. Coronary arterial
calcifications.

Hepatobiliary: Mildly distended gallbladder without calcification.
Tiny RIGHT lobe probable hepatic cyst, stable. Liver otherwise
normal appearance. No biliary dilatation

Pancreas: Normal appearance

Spleen: Normal appearance

Adrenals/Urinary Tract: Adrenal glands normal appearance. Horseshoe
kidney with small cysts. No hydronephrosis, additional renal mass,
or urinary tract calcification. Decompressed bladder. Unremarkable
ureters.

Stomach/Bowel: Normal appendix. Dilated proximal and decompressed
distal small bowel loops compatible with mid small bowel
obstruction. Transition zone appears to be at the anterior mid
abdomen question adhesion. Colon decompressed with few scattered
colonic diverticula. Tiny hiatal hernia, stomach otherwise
unremarkable.

Vascular/Lymphatic: Scattered atherosclerotic calcifications without
aneurysm. No adenopathy

Reproductive: N/A

Other: No mass, free fluid, or free air. Suspect small BILATERAL
inguinal hernias containing fat.

Musculoskeletal: Bones demineralized. Scattered degenerative disc
disease changes lumbar spine.
IMPRESSION: Mid small bowel obstruction secondary to probable adhesion at the
anterior mid abdomen.

No evidence of perforation or abscess.

Minimal colonic diverticulosis.

Horseshoe kidney.

Suspected small BILATERAL inguinal hernias.

## 2016-06-22 ENCOUNTER — Encounter (HOSPITAL_COMMUNITY)
Admission: RE | Admit: 2016-06-22 | Discharge: 2016-06-22 | Disposition: A | Discharge status: Home / Self Care | Payer: Medicare Other | Source: Ambulatory Visit | Attending: Surgery | Admitting: Surgery

## 2016-06-22 ENCOUNTER — Encounter (HOSPITAL_COMMUNITY): Payer: Self-pay

## 2016-06-22 HISTORY — DX: Reserved for inherently not codable concepts without codable children: IMO0001

## 2016-06-22 HISTORY — DX: Unspecified hearing loss, unspecified ear: H91.90

## 2016-06-22 LAB — BASIC METABOLIC PANEL
Anion gap: 7 (ref 5–15)
BUN: 19 mg/dL (ref 6–20)
CALCIUM: 8.8 mg/dL — AB (ref 8.9–10.3)
CO2: 25 mmol/L (ref 22–32)
CREATININE: 1.15 mg/dL (ref 0.61–1.24)
Chloride: 107 mmol/L (ref 101–111)
Glucose, Bld: 115 mg/dL — ABNORMAL HIGH (ref 65–99)
Potassium: 3.9 mmol/L (ref 3.5–5.1)
SODIUM: 139 mmol/L (ref 135–145)

## 2016-06-22 LAB — CBC
HCT: 40.7 % (ref 39.0–52.0)
Hemoglobin: 13.7 g/dL (ref 13.0–17.0)
MCH: 31.2 pg (ref 26.0–34.0)
MCHC: 33.7 g/dL (ref 30.0–36.0)
MCV: 92.7 fL (ref 78.0–100.0)
PLATELETS: 238 10*3/uL (ref 150–400)
RBC: 4.39 MIL/uL (ref 4.22–5.81)
RDW: 13 % (ref 11.5–15.5)
WBC: 5.8 10*3/uL (ref 4.0–10.5)

## 2016-06-22 LAB — ABO/RH: ABO/RH(D): B POS

## 2016-06-22 NOTE — Pre-Procedure Instructions (Signed)
EKG 3'17, CT abd/pelvis 04-21-16 Epic.

## 2016-06-22 NOTE — Patient Instructions (Addendum)
Matthew Hodges  06/22/2016   Your procedure is scheduled on: 06-25-16  Report to [facility] Main  Entrance take [facility]  elevators to 3rd floor to  [place] at   [place] AM.  Call this number if you have problems the morning of surgery [phone]   Remember: ONLY 1 PERSON MAY GO WITH YOU TO SHORT STAY TO GET  READY MORNING OF [place].  Do not eat food or drink liquids :After Midnight.     Take these medicines the morning of surgery with A SIP OF WATER: None. DO NOT TAKE ANY DIABETIC MEDICATIONS DAY OF YOUR SURGERY                               You may not have any metal on your body including hair pins and              piercings  Do not wear jewelry, make-up, lotions, powders or perfumes, deodorant             Do not wear nail polish.  Do not shave  48 hours prior to surgery.              Men may shave face and neck.   Do not bring valuables to the hospital. [place].  Contacts, dentures or bridgework may not be worn into surgery.  Leave suitcase in the car. After surgery it may be brought to your room.     Patients discharged the day of surgery will not be allowed to drive home.  Name and phone number of your driver:Peggy -spouse [phone] cell  Special Instructions: N/A              Please read over the following fact sheets you were given: _____________________________________________________________________             [facility] - Preparing for Surgery Before surgery, you can play an important role.  Because skin is not sterile, your skin needs to be as free of germs as possible.  You can reduce the number of germs on your skin by washing with CHG (chlorahexidine gluconate) soap before surgery.  CHG is an antiseptic cleaner which kills germs and bonds with the skin to continue killing germs even after washing. Please DO NOT use if you have an allergy to CHG or antibacterial soaps.  If  your skin becomes reddened/irritated stop using the CHG and inform your nurse when you arrive at Short Stay. Do not shave (including legs and underarms) for at least 48 hours prior to the first CHG shower.  You may shave your face/neck. Please follow these instructions carefully:  1.  Shower with CHG Soap the night before surgery and the  morning of Surgery.  2.  If you choose to wash your hair, wash your hair first as usual with your  normal  shampoo.  3.  After you shampoo, rinse your hair and body thoroughly to remove the  shampoo.                           4.  Use CHG as you would any other liquid soap.  You can apply chg directly  to the  skin and wash                       Gently with a scrungie or clean washcloth.  5.  Apply the CHG Soap to your body ONLY FROM THE NECK DOWN.   Do not use on face/ open                           Wound or open sores. Avoid contact with eyes, ears mouth and genitals (private parts).                       Wash face,  Genitals (private parts) with your normal soap.             6.  Wash thoroughly, paying special attention to the area where your surgery  will be performed.  7.  Thoroughly rinse your body with warm water from the neck down.  8.  DO NOT shower/wash with your normal soap after using and rinsing off  the CHG Soap.                9.  Pat yourself dry with a clean towel.            10.  Wear clean pajamas.            11.  Place clean sheets on your bed the night of your first shower and do not  sleep with pets. Day of Surgery : Do not apply any lotions/deodorants the morning of surgery.  Please wear clean clothes to the hospital/surgery center.  FAILURE TO FOLLOW THESE INSTRUCTIONS MAY RESULT IN THE CANCELLATION OF YOUR SURGERY PATIENT SIGNATURE_________________________________  NURSE SIGNATURE__________________________________  ________________________________________________________________________

## 2016-06-25 ENCOUNTER — Inpatient Hospital Stay (HOSPITAL_COMMUNITY): Payer: Medicare Other | Admitting: Certified Registered Nurse Anesthetist

## 2016-06-25 ENCOUNTER — Encounter (HOSPITAL_COMMUNITY)
Admission: RE | Disposition: A | Discharge status: Home / Self Care | Payer: Self-pay | Source: Ambulatory Visit | Attending: Surgery

## 2016-06-25 ENCOUNTER — Inpatient Hospital Stay (HOSPITAL_COMMUNITY)
Admission: RE | Admit: 2016-06-25 | Discharge: 2016-06-26 | DRG: 337 | Disposition: A | Discharge status: Home / Self Care | Payer: Medicare Other | Source: Ambulatory Visit | Attending: Surgery | Admitting: Surgery

## 2016-06-25 ENCOUNTER — Encounter (HOSPITAL_COMMUNITY): Payer: Self-pay | Admitting: *Deleted

## 2016-06-25 DIAGNOSIS — Z6830 Body mass index (BMI) 30.0-30.9, adult: Secondary | ICD-10-CM

## 2016-06-25 DIAGNOSIS — Z7982 Long term (current) use of aspirin: Secondary | ICD-10-CM

## 2016-06-25 DIAGNOSIS — Z923 Personal history of irradiation: Secondary | ICD-10-CM | POA: Diagnosis not present

## 2016-06-25 DIAGNOSIS — K5660 Unspecified intestinal obstruction: Secondary | ICD-10-CM | POA: Diagnosis present

## 2016-06-25 DIAGNOSIS — Z8673 Personal history of transient ischemic attack (TIA), and cerebral infarction without residual deficits: Secondary | ICD-10-CM | POA: Diagnosis not present

## 2016-06-25 DIAGNOSIS — K565 Intestinal adhesions [bands] with obstruction (postprocedural) (postinfection): Principal | ICD-10-CM | POA: Diagnosis present

## 2016-06-25 DIAGNOSIS — E785 Hyperlipidemia, unspecified: Secondary | ICD-10-CM | POA: Diagnosis present

## 2016-06-25 DIAGNOSIS — Z9049 Acquired absence of other specified parts of digestive tract: Secondary | ICD-10-CM

## 2016-06-25 DIAGNOSIS — K56609 Unspecified intestinal obstruction, unspecified as to partial versus complete obstruction: Secondary | ICD-10-CM | POA: Diagnosis present

## 2016-06-25 DIAGNOSIS — Z87891 Personal history of nicotine dependence: Secondary | ICD-10-CM

## 2016-06-25 DIAGNOSIS — H9193 Unspecified hearing loss, bilateral: Secondary | ICD-10-CM | POA: Diagnosis present

## 2016-06-25 DIAGNOSIS — Z8546 Personal history of malignant neoplasm of prostate: Secondary | ICD-10-CM | POA: Diagnosis not present

## 2016-06-25 DIAGNOSIS — Z79899 Other long term (current) drug therapy: Secondary | ICD-10-CM

## 2016-06-25 HISTORY — PX: LAPAROSCOPIC LYSIS OF ADHESIONS: SHX5905

## 2016-06-25 SURGERY — LYSIS, ADHESIONS, LAPAROSCOPIC
Anesthesia: General

## 2016-06-25 MED ORDER — CHLORHEXIDINE GLUCONATE 4 % EX LIQD: 1.0000 "application " | Freq: Once | CUTANEOUS | Status: DC

## 2016-06-25 MED ORDER — HYDROMORPHONE HCL 2 MG/ML IJ SOLN
INTRAMUSCULAR | Status: AC
  Filled 2016-06-25: qty 1

## 2016-06-25 MED ORDER — GLYCOPYRROLATE 0.2 MG/ML IJ SOLN
INTRAMUSCULAR | Status: DC | PRN
  Administered 2016-06-25: 0.2 mg via INTRAVENOUS

## 2016-06-25 MED ORDER — OXYCODONE HCL 5 MG/5ML PO SOLN
5.0000 mg | Freq: Once | ORAL | Status: DC | PRN
  Filled 2016-06-25: qty 5

## 2016-06-25 MED ORDER — ONDANSETRON 4 MG PO TBDP: 4.0000 mg | ORAL_TABLET | Freq: Four times a day (QID) | ORAL | Status: DC | PRN

## 2016-06-25 MED ORDER — PHENOL 1.4 % MT LIQD: 2.0000 | OROMUCOSAL | Status: DC | PRN

## 2016-06-25 MED ORDER — FENTANYL CITRATE (PF) 250 MCG/5ML IJ SOLN
INTRAMUSCULAR | Status: AC
  Filled 2016-06-25: qty 5

## 2016-06-25 MED ORDER — FENTANYL CITRATE (PF) 100 MCG/2ML IJ SOLN
INTRAMUSCULAR | Status: AC
  Filled 2016-06-25: qty 2

## 2016-06-25 MED ORDER — DIPHENHYDRAMINE HCL 50 MG/ML IJ SOLN: 12.5000 mg | Freq: Four times a day (QID) | INTRAMUSCULAR | Status: DC | PRN

## 2016-06-25 MED ORDER — MIRABEGRON ER 25 MG PO TB24
25.0000 mg | ORAL_TABLET | Freq: Every morning | ORAL | Status: DC
  Administered 2016-06-25 – 2016-06-26 (×2): 25 mg via ORAL
  Filled 2016-06-25 (×2): qty 1

## 2016-06-25 MED ORDER — ALVIMOPAN 12 MG PO CAPS
12.0000 mg | ORAL_CAPSULE | Freq: Once | ORAL | Status: AC
  Administered 2016-06-25: 12 mg via ORAL
  Filled 2016-06-25: qty 1

## 2016-06-25 MED ORDER — CHOLECALCIFEROL 10 MCG (400 UNIT) PO TABS
400.0000 [IU] | ORAL_TABLET | Freq: Every morning | ORAL | Status: DC
  Administered 2016-06-26: 400 [IU] via ORAL
  Filled 2016-06-25: qty 1

## 2016-06-25 MED ORDER — PROCHLORPERAZINE EDISYLATE 5 MG/ML IJ SOLN: 5.0000 mg | INTRAMUSCULAR | Status: DC | PRN

## 2016-06-25 MED ORDER — ONDANSETRON HCL 4 MG/2ML IJ SOLN
INTRAMUSCULAR | Status: AC
  Filled 2016-06-25: qty 2

## 2016-06-25 MED ORDER — ACETAMINOPHEN 325 MG PO TABS
650.0000 mg | ORAL_TABLET | ORAL | Status: AC
  Administered 2016-06-25: 650 mg via ORAL
  Filled 2016-06-25: qty 2

## 2016-06-25 MED ORDER — BUPIVACAINE-EPINEPHRINE 0.25% -1:200000 IJ SOLN
INTRAMUSCULAR | Status: AC
  Filled 2016-06-25: qty 2

## 2016-06-25 MED ORDER — CEFOTETAN DISODIUM-DEXTROSE 2-2.08 GM-% IV SOLR
INTRAVENOUS | Status: AC
  Filled 2016-06-25: qty 50

## 2016-06-25 MED ORDER — MENTHOL 3 MG MT LOZG
1.0000 | LOZENGE | OROMUCOSAL | Status: DC | PRN
Start: 2016-06-25 — End: 2016-06-26

## 2016-06-25 MED ORDER — OXYCODONE HCL 5 MG PO TABS: 5.0000 mg | ORAL_TABLET | Freq: Once | ORAL | Status: DC | PRN

## 2016-06-25 MED ORDER — MIDAZOLAM HCL 5 MG/5ML IJ SOLN
INTRAMUSCULAR | Status: DC | PRN
  Administered 2016-06-25: 1 mg via INTRAVENOUS

## 2016-06-25 MED ORDER — SODIUM CHLORIDE 0.9 % IJ SOLN
INTRAMUSCULAR | Status: AC
  Filled 2016-06-25: qty 10

## 2016-06-25 MED ORDER — PHENYLEPHRINE 40 MCG/ML (10ML) SYRINGE FOR IV PUSH (FOR BLOOD PRESSURE SUPPORT)
PREFILLED_SYRINGE | INTRAVENOUS | Status: AC
  Filled 2016-06-25: qty 10

## 2016-06-25 MED ORDER — ROCURONIUM BROMIDE 100 MG/10ML IV SOLN
INTRAVENOUS | Status: DC | PRN
  Administered 2016-06-25 (×8): 10 mg via INTRAVENOUS
  Administered 2016-06-25: 40 mg via INTRAVENOUS
  Administered 2016-06-25: 20 mg via INTRAVENOUS
  Administered 2016-06-25: 10 mg via INTRAVENOUS
  Administered 2016-06-25: 20 mg via INTRAVENOUS

## 2016-06-25 MED ORDER — TRAMADOL HCL 50 MG PO TABS: 50.0000 mg | ORAL_TABLET | Freq: Four times a day (QID) | ORAL | 0 refills | Status: DC | PRN

## 2016-06-25 MED ORDER — 0.9 % SODIUM CHLORIDE (POUR BTL) OPTIME
TOPICAL | Status: DC | PRN
  Administered 2016-06-25: 1000 mL

## 2016-06-25 MED ORDER — LIP MEDEX EX OINT
1.0000 "application " | TOPICAL_OINTMENT | Freq: Two times a day (BID) | CUTANEOUS | Status: DC
  Administered 2016-06-25: 1 via TOPICAL

## 2016-06-25 MED ORDER — SUGAMMADEX SODIUM 200 MG/2ML IV SOLN
INTRAVENOUS | Status: AC
  Filled 2016-06-25: qty 2

## 2016-06-25 MED ORDER — TRAMADOL HCL 50 MG PO TABS
50.0000 mg | ORAL_TABLET | Freq: Four times a day (QID) | ORAL | Status: DC | PRN
  Administered 2016-06-26: 100 mg via ORAL
  Filled 2016-06-25: qty 2

## 2016-06-25 MED ORDER — METOPROLOL TARTRATE 12.5 MG HALF TABLET
12.5000 mg | ORAL_TABLET | Freq: Two times a day (BID) | ORAL | Status: DC | PRN
  Filled 2016-06-25: qty 1

## 2016-06-25 MED ORDER — LACTATED RINGERS IV SOLN: INTRAVENOUS | Status: DC

## 2016-06-25 MED ORDER — METOPROLOL TARTRATE 5 MG/5ML IV SOLN: 5.0000 mg | Freq: Four times a day (QID) | INTRAVENOUS | Status: DC | PRN

## 2016-06-25 MED ORDER — GLYCOPYRROLATE 0.2 MG/ML IJ SOLN
INTRAMUSCULAR | Status: AC
  Filled 2016-06-25: qty 1

## 2016-06-25 MED ORDER — HYDRALAZINE HCL 20 MG/ML IJ SOLN: 10.0000 mg | INTRAMUSCULAR | Status: DC | PRN

## 2016-06-25 MED ORDER — KETOROLAC TROMETHAMINE 0.5 % OP SOLN
1.0000 [drp] | Freq: Three times a day (TID) | OPHTHALMIC | Status: AC | PRN
  Administered 2016-06-25: 1 [drp] via OPHTHALMIC
  Filled 2016-06-25: qty 3

## 2016-06-25 MED ORDER — FENTANYL CITRATE (PF) 100 MCG/2ML IJ SOLN
INTRAMUSCULAR | Status: DC | PRN
  Administered 2016-06-25 (×3): 25 ug via INTRAVENOUS
  Administered 2016-06-25: 50 ug via INTRAVENOUS
  Administered 2016-06-25 (×3): 25 ug via INTRAVENOUS
  Administered 2016-06-25: 50 ug via INTRAVENOUS
  Administered 2016-06-25 (×4): 25 ug via INTRAVENOUS

## 2016-06-25 MED ORDER — ROCURONIUM BROMIDE 100 MG/10ML IV SOLN
INTRAVENOUS | Status: AC
  Filled 2016-06-25: qty 1

## 2016-06-25 MED ORDER — LACTATED RINGERS IV BOLUS (SEPSIS): 1000.0000 mL | Freq: Three times a day (TID) | INTRAVENOUS | Status: DC | PRN

## 2016-06-25 MED ORDER — GABAPENTIN 300 MG PO CAPS
300.0000 mg | ORAL_CAPSULE | ORAL | Status: AC
  Administered 2016-06-25: 300 mg via ORAL
  Filled 2016-06-25: qty 1

## 2016-06-25 MED ORDER — PHENYLEPHRINE HCL 10 MG/ML IJ SOLN
INTRAMUSCULAR | Status: DC | PRN
  Administered 2016-06-25 (×3): 80 ug via INTRAVENOUS

## 2016-06-25 MED ORDER — EPHEDRINE SULFATE 50 MG/ML IJ SOLN
INTRAMUSCULAR | Status: AC
  Filled 2016-06-25: qty 1

## 2016-06-25 MED ORDER — ADULT MULTIVITAMIN W/MINERALS CH
1.0000 | ORAL_TABLET | Freq: Every day | ORAL | Status: DC
  Administered 2016-06-26: 1 via ORAL
  Filled 2016-06-25: qty 1

## 2016-06-25 MED ORDER — DEXAMETHASONE SODIUM PHOSPHATE 10 MG/ML IJ SOLN
INTRAMUSCULAR | Status: AC
  Filled 2016-06-25: qty 1

## 2016-06-25 MED ORDER — ASPIRIN EC 81 MG PO TBEC
81.0000 mg | DELAYED_RELEASE_TABLET | Freq: Every morning | ORAL | Status: DC
  Administered 2016-06-26: 81 mg via ORAL
  Filled 2016-06-25: qty 1

## 2016-06-25 MED ORDER — METHOCARBAMOL 500 MG PO TABS: 500.0000 mg | ORAL_TABLET | Freq: Four times a day (QID) | ORAL | Status: DC | PRN

## 2016-06-25 MED ORDER — PROPOFOL 10 MG/ML IV BOLUS
INTRAVENOUS | Status: AC
  Filled 2016-06-25: qty 20

## 2016-06-25 MED ORDER — LIDOCAINE HCL (CARDIAC) 20 MG/ML IV SOLN
INTRAVENOUS | Status: DC | PRN
  Administered 2016-06-25: 100 mg via INTRAVENOUS

## 2016-06-25 MED ORDER — LACTATED RINGERS IR SOLN
Status: DC | PRN
  Administered 2016-06-25: 1

## 2016-06-25 MED ORDER — BSS IO SOLN
15.0000 mL | Freq: Once | INTRAOCULAR | Status: AC
  Administered 2016-06-25: 15 mL
  Filled 2016-06-25: qty 15

## 2016-06-25 MED ORDER — HYDROMORPHONE HCL 1 MG/ML IJ SOLN
INTRAMUSCULAR | Status: DC | PRN
  Administered 2016-06-25 (×2): .4 mg via INTRAVENOUS

## 2016-06-25 MED ORDER — SUGAMMADEX SODIUM 200 MG/2ML IV SOLN
INTRAVENOUS | Status: DC | PRN
  Administered 2016-06-25: 200 mg via INTRAVENOUS

## 2016-06-25 MED ORDER — BUPIVACAINE-EPINEPHRINE 0.25% -1:200000 IJ SOLN
INTRAMUSCULAR | Status: DC | PRN
  Administered 2016-06-25: 35 mL

## 2016-06-25 MED ORDER — MAGIC MOUTHWASH
15.0000 mL | Freq: Four times a day (QID) | ORAL | Status: DC | PRN
  Filled 2016-06-25: qty 15

## 2016-06-25 MED ORDER — HYDROMORPHONE HCL 1 MG/ML IJ SOLN: 0.5000 mg | INTRAMUSCULAR | Status: DC | PRN

## 2016-06-25 MED ORDER — ENSURE ENLIVE PO LIQD: 237.0000 mL | Freq: Two times a day (BID) | ORAL | Status: DC

## 2016-06-25 MED ORDER — CEFAZOLIN SODIUM-DEXTROSE 2-4 GM/100ML-% IV SOLN
2.0000 g | Freq: Three times a day (TID) | INTRAVENOUS | Status: AC
  Administered 2016-06-25: 2 g via INTRAVENOUS
  Filled 2016-06-25: qty 100

## 2016-06-25 MED ORDER — CEFOTETAN DISODIUM-DEXTROSE 2-2.08 GM-% IV SOLR
2.0000 g | INTRAVENOUS | Status: AC
  Administered 2016-06-25: 2 g via INTRAVENOUS

## 2016-06-25 MED ORDER — POLYETHYLENE GLYCOL 3350 17 G PO PACK
17.0000 g | PACK | Freq: Every day | ORAL | Status: DC
  Administered 2016-06-25 – 2016-06-26 (×2): 17 g via ORAL
  Filled 2016-06-25 (×2): qty 1

## 2016-06-25 MED ORDER — MIDAZOLAM HCL 2 MG/2ML IJ SOLN
INTRAMUSCULAR | Status: AC
  Filled 2016-06-25: qty 2

## 2016-06-25 MED ORDER — SUCCINYLCHOLINE CHLORIDE 20 MG/ML IJ SOLN
INTRAMUSCULAR | Status: DC | PRN
  Administered 2016-06-25: 100 mg via INTRAVENOUS

## 2016-06-25 MED ORDER — PROPOFOL 10 MG/ML IV BOLUS
INTRAVENOUS | Status: DC | PRN
  Administered 2016-06-25: 130 mg via INTRAVENOUS

## 2016-06-25 MED ORDER — BUPIVACAINE LIPOSOME 1.3 % IJ SUSP
20.0000 mL | Freq: Once | INTRAMUSCULAR | Status: AC
  Administered 2016-06-25: 20 mL
  Filled 2016-06-25: qty 20

## 2016-06-25 MED ORDER — ACETAMINOPHEN 325 MG PO TABS: 325.0000 mg | ORAL_TABLET | ORAL | Status: DC | PRN

## 2016-06-25 MED ORDER — LACTATED RINGERS IV SOLN
INTRAVENOUS | Status: DC
  Administered 2016-06-25: 16:00:00 via INTRAVENOUS

## 2016-06-25 MED ORDER — ATORVASTATIN CALCIUM 20 MG PO TABS
20.0000 mg | ORAL_TABLET | Freq: Every day | ORAL | Status: DC
Start: 2016-06-25 — End: 2016-06-26
  Administered 2016-06-25: 20 mg via ORAL
  Filled 2016-06-25: qty 1

## 2016-06-25 MED ORDER — ONDANSETRON HCL 4 MG/2ML IJ SOLN: 4.0000 mg | Freq: Four times a day (QID) | INTRAMUSCULAR | Status: DC | PRN

## 2016-06-25 MED ORDER — DEXAMETHASONE SODIUM PHOSPHATE 10 MG/ML IJ SOLN
INTRAMUSCULAR | Status: DC | PRN
  Administered 2016-06-25: 10 mg via INTRAVENOUS

## 2016-06-25 MED ORDER — ONDANSETRON HCL 4 MG/2ML IJ SOLN
INTRAMUSCULAR | Status: DC | PRN
  Administered 2016-06-25: 4 mg via INTRAVENOUS

## 2016-06-25 MED ORDER — LIDOCAINE HCL (CARDIAC) 20 MG/ML IV SOLN
INTRAVENOUS | Status: AC
  Filled 2016-06-25: qty 5

## 2016-06-25 MED ORDER — BISACODYL 10 MG RE SUPP: 10.0000 mg | Freq: Two times a day (BID) | RECTAL | Status: DC | PRN

## 2016-06-25 MED ORDER — FENTANYL CITRATE (PF) 100 MCG/2ML IJ SOLN
25.0000 ug | INTRAMUSCULAR | Status: DC | PRN
  Administered 2016-06-25: 50 ug via INTRAVENOUS

## 2016-06-25 MED ORDER — DIPHENHYDRAMINE HCL 12.5 MG/5ML PO ELIX: 12.5000 mg | ORAL_SOLUTION | Freq: Four times a day (QID) | ORAL | Status: DC | PRN

## 2016-06-25 MED ORDER — LACTATED RINGERS IV SOLN
INTRAVENOUS | Status: DC | PRN
  Administered 2016-06-25 (×3): via INTRAVENOUS

## 2016-06-25 MED ORDER — ACETAMINOPHEN 160 MG/5ML PO SOLN: 325.0000 mg | ORAL | Status: DC | PRN

## 2016-06-25 MED ORDER — ENOXAPARIN SODIUM 40 MG/0.4ML ~~LOC~~ SOLN
40.0000 mg | SUBCUTANEOUS | Status: DC
  Administered 2016-06-26: 40 mg via SUBCUTANEOUS
  Filled 2016-06-25: qty 0.4

## 2016-06-25 SURGICAL SUPPLY — 78 items
APPLICATOR COTTON TIP 6IN STRL (MISCELLANEOUS) ×1 IMPLANT
APPLIER CLIP 5 13 M/L LIGAMAX5 (MISCELLANEOUS)
APPLIER CLIP ROT 10 11.4 M/L (STAPLE)
APR CLP MED LRG 11.4X10 (STAPLE)
APR CLP MED LRG 5 ANG JAW (MISCELLANEOUS)
BLADE HEX COATED 2.75 (ELECTRODE) ×2 IMPLANT
BLADE SURG SZ10 CARB STEEL (BLADE) ×2 IMPLANT
CABLE HIGH FREQUENCY MONO STRZ (ELECTRODE) ×2 IMPLANT
CATH FOLEY LATEX FREE 16FR (CATHETERS) ×1 IMPLANT
CELLS DAT CNTRL 66122 CELL SVR (MISCELLANEOUS) IMPLANT
CLIP APPLIE 5 13 M/L LIGAMAX5 (MISCELLANEOUS) IMPLANT
CLIP APPLIE ROT 10 11.4 M/L (STAPLE) IMPLANT
COVER MAYO STAND STRL (DRAPES) ×2 IMPLANT
COVER SURGICAL LIGHT HANDLE (MISCELLANEOUS) ×1 IMPLANT
DECANTER SPIKE VIAL GLASS SM (MISCELLANEOUS) ×2 IMPLANT
DRAIN CHANNEL 19F RND (DRAIN) IMPLANT
DRAPE LAPAROSCOPIC ABDOMINAL (DRAPES) ×2 IMPLANT
DRAPE SHEET LG 3/4 BI-LAMINATE (DRAPES) ×1 IMPLANT
DRAPE UTILITY XL STRL (DRAPES) ×2 IMPLANT
DRAPE WARM FLUID 44X44 (DRAPE) ×2 IMPLANT
DRSG OPSITE POSTOP 4X10 (GAUZE/BANDAGES/DRESSINGS) IMPLANT
DRSG OPSITE POSTOP 4X6 (GAUZE/BANDAGES/DRESSINGS) IMPLANT
DRSG OPSITE POSTOP 4X8 (GAUZE/BANDAGES/DRESSINGS) IMPLANT
DRSG TEGADERM 2-3/8X2-3/4 SM (GAUZE/BANDAGES/DRESSINGS) ×4 IMPLANT
DRSG TEGADERM 4X4.75 (GAUZE/BANDAGES/DRESSINGS) ×2 IMPLANT
DRSG TEGADERM 6X8 (GAUZE/BANDAGES/DRESSINGS) ×1 IMPLANT
ELECT PENCIL ROCKER SW 15FT (MISCELLANEOUS) ×2 IMPLANT
ELECT REM PT RETURN 9FT ADLT (ELECTROSURGICAL) ×2
ELECTRODE REM PT RTRN 9FT ADLT (ELECTROSURGICAL) ×1 IMPLANT
ENDOLOOP SUT PDS II  0 18 (SUTURE)
ENDOLOOP SUT PDS II 0 18 (SUTURE) IMPLANT
GAUZE SPONGE 2X2 8PLY STRL LF (GAUZE/BANDAGES/DRESSINGS) ×1 IMPLANT
GAUZE SPONGE 4X4 12PLY STRL (GAUZE/BANDAGES/DRESSINGS) IMPLANT
GLOVE ECLIPSE 8.0 STRL XLNG CF (GLOVE) ×2 IMPLANT
GLOVE INDICATOR 8.0 STRL GRN (GLOVE) ×2 IMPLANT
GOWN STRL REUS W/TWL XL LVL3 (GOWN DISPOSABLE) ×8 IMPLANT
HANDLE SUCTION POOLE (INSTRUMENTS) ×1 IMPLANT
IRRIG SUCT STRYKERFLOW 2 WTIP (MISCELLANEOUS) ×2
IRRIGATION SUCT STRKRFLW 2 WTP (MISCELLANEOUS) ×1 IMPLANT
KIT BASIN OR (CUSTOM PROCEDURE TRAY) ×2 IMPLANT
LEGGING LITHOTOMY PAIR STRL (DRAPES) ×2 IMPLANT
LUBRICANT JELLY K Y 4OZ (MISCELLANEOUS) IMPLANT
PAD POSITIONING PINK XL (MISCELLANEOUS) ×2 IMPLANT
POSITIONER SURGICAL ARM (MISCELLANEOUS) ×2 IMPLANT
RETRACTOR WND ALEXIS 18 MED (MISCELLANEOUS) IMPLANT
RTRCTR WOUND ALEXIS 18CM MED (MISCELLANEOUS)
SCISSORS LAP 5X35 DISP (ENDOMECHANICALS) ×2 IMPLANT
SEALER TISSUE G2 STRG ARTC 35C (ENDOMECHANICALS) IMPLANT
SLEEVE XCEL OPT CAN 5 100 (ENDOMECHANICALS) ×4 IMPLANT
SPONGE GAUZE 2X2 STER 10/PKG (GAUZE/BANDAGES/DRESSINGS) ×1
SPONGE LAP 18X18 X RAY DECT (DISPOSABLE) IMPLANT
STAPLER VISISTAT 35W (STAPLE) IMPLANT
SUCTION POOLE HANDLE (INSTRUMENTS) ×2
SUT MNCRL AB 4-0 PS2 18 (SUTURE) ×2 IMPLANT
SUT PDS AB 1 CTX 36 (SUTURE) ×4 IMPLANT
SUT PDS AB 1 TP1 96 (SUTURE) IMPLANT
SUT PROLENE 0 CT 2 (SUTURE) IMPLANT
SUT SILK 2 0 (SUTURE) ×2
SUT SILK 2 0 SH CR/8 (SUTURE) ×2 IMPLANT
SUT SILK 2-0 18XBRD TIE 12 (SUTURE) ×1 IMPLANT
SUT SILK 3 0 (SUTURE) ×2
SUT SILK 3 0 SH CR/8 (SUTURE) ×2 IMPLANT
SUT SILK 3-0 18XBRD TIE 12 (SUTURE) ×1 IMPLANT
SUT VIC AB 3-0 SH 27 (SUTURE) ×2
SUT VIC AB 3-0 SH 27X BRD (SUTURE) IMPLANT
SYR BULB IRRIGATION 50ML (SYRINGE) ×2 IMPLANT
SYS LAPSCP GELPORT 120MM (MISCELLANEOUS)
SYSTEM LAPSCP GELPORT 120MM (MISCELLANEOUS) IMPLANT
TAPE UMBILICAL COTTON 1/8X30 (MISCELLANEOUS) IMPLANT
TOWEL OR 17X26 10 PK STRL BLUE (TOWEL DISPOSABLE) ×4 IMPLANT
TOWEL OR NON WOVEN STRL DISP B (DISPOSABLE) ×2 IMPLANT
TRAY FOLEY W/METER SILVER 14FR (SET/KITS/TRAYS/PACK) IMPLANT
TRAY FOLEY W/METER SILVER 16FR (SET/KITS/TRAYS/PACK) IMPLANT
TRAY LAPAROSCOPIC (CUSTOM PROCEDURE TRAY) ×2 IMPLANT
TROCAR BLADELESS OPT 5 100 (ENDOMECHANICALS) ×2 IMPLANT
TROCAR XCEL NON-BLD 11X100MML (ENDOMECHANICALS) IMPLANT
TUBING INSUF HEATED (TUBING) ×2 IMPLANT
YANKAUER SUCT BULB TIP 10FT TU (MISCELLANEOUS) ×2 IMPLANT

## 2016-06-25 NOTE — Anesthesia Preprocedure Evaluation (Signed)
Anesthesia Evaluation  Patient identified by MRN, date of birth, ID band Patient awake    Reviewed: Allergy & Precautions, NPO status , Patient's Chart, lab work & pertinent test results  History of Anesthesia Complications Negative for: history of anesthetic complications  Airway Mallampati: III  TM Distance: <3 FB Neck ROM: Full    Dental  (+) Edentulous Upper, Edentulous Lower   Pulmonary neg pulmonary ROS, former smoker,    breath sounds clear to auscultation       Cardiovascular hypertension, (-) angina(-) Past MI and (-) CHF  Rhythm:Regular     Neuro/Psych negative neurological ROS     GI/Hepatic negative GI ROS,   Endo/Other  negative endocrine ROSMorbid obesity  Renal/GU negative Renal ROS     Musculoskeletal   Abdominal   Peds  Hematology negative hematology ROS (+)   Anesthesia Other Findings   Reproductive/Obstetrics                             Anesthesia Physical Anesthesia Plan  ASA: II  Anesthesia Plan: General   Post-op Pain Management:    Induction: Intravenous  Airway Management Planned: Oral ETT  Additional Equipment: None  Intra-op Plan:   Post-operative Plan: Extubation in OR  Informed Consent: I have reviewed the patients History and Physical, chart, labs and discussed the procedure including the risks, benefits and alternatives for the proposed anesthesia with the patient or authorized representative who has indicated his/her understanding and acceptance.   Dental advisory given  Plan Discussed with: CRNA and Surgeon  Anesthesia Plan Comments:         Anesthesia Quick Evaluation

## 2016-06-25 NOTE — H&P (Addendum)
H&P      Matthew Hodges 05/05/2016 10:02 AM Location: [facility] Patient #: [id number] DOB: [birth year] Married / Language: English / Race: White Male  Patient Care Team: Darcus Austin, MD as PCP - General (Family Medicine) Druscilla Brownie, MD (Dermatology) Carolan Clines, MD as Attending Physician (Urology) Michael Boston, MD as Consulting Physician (General Surgery)   History of Present Illness The patient is a 75 year old male who presents with small bowel obstruction. Note for "Small bowel obstruction": Patient returns after her admission for small bowel obstruction.  Pleasant elderly gentleman with numerous surgeries. Had ruptured diverticulitis that required colostomy. Eventually takedown. Developed recurrent stitch abscesses and hernia repairs. Had re-repair laparoscopically of recurrent incisional hernias May 2016. He developed small bowel obstruction in October 2016 that resolved quickly nonoperatively. He had a recurrent small bowel obstruction in January. I saw him. He declined any intervention. He is gone back to be readmitted for bowel obstructions in March. Again in May. Because of the recurrent bowel obstructions, he comes in today to see if elective lysis of adhesions was required. Patient has since he's probably had at least 5 episodes since repair of his recurrent incisional hernias. Year ago. Usually the bowel obstructions resolve within a few days in the hospital nonoperatively.  He comes today with his wife. He feels well. Eating solid diet. No nausea or vomiting. Food diet paternity regular food now. Energy level good. Appetite good. Moving bowels twice a day. He tries to eat smaller meals. Chews well. Drinks any liquids. He's had issues even with just being on full liquids. He does not feel any new lumps or hernias. No problems with skin stitch abscesses anymore.      SURGERY 04/23/2015  PATIENT: Matthew Hodges  75 y.o. male  Patient Care Team: Darcus Austin, MD as PCP - General (Family Medicine) Druscilla Brownie, MD (Dermatology) Carolan Clines, MD as Attending Physician (Urology)  PRE-OPERATIVE DIAGNOSIS: Incisonal Ventral Wall Abdominal Hernia  POST-OPERATIVE DIAGNOSIS: Incisonal Ventral Wall Abdominal Hernia AND ADHESIONS  PROCEDURE:  LAPAROSCOPIC VENTRAL WALL HERNIA REPAIR with mesh LAPAROSCOPIC LYSIS OF ADHESIONS X 2 HOURS (66% of case)  SURGEON: Surgeon(s): Michael Boston, MD  OR FINDINGS:  Swiss cheese hernias noted an central region. Prior onlay polypropylene mesh with holes within the mesh. Those are the most obvious thinned out areas. 12x8 cm swiss cheese region. Moderate diastases/hernation in central abdomen 21 x 17 cm region. Prior left lower quadrant colostomy hernia repair intact with Parietex mesh.  Very dense adhesions of small bowel & colon to mesh.Greater omentum stuck in left upper quadrant.    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 COLOSTOMY TAKEDOWN [phone]) 08/1999 Dr  Excell Seltzer OPEN REPAIR, HERNIA, ABDOMINAL ([id number]) 02/2000 Open repair of midline & LLQ hwernias  with mesh. Dr Excell Seltzer LAPAROSCOPIC REPAIR, [place], REDUCIBLE [phone]) 2008 PHYSICIAN: [name], MD DATE OF BIRTH: 11-13-1941  DATE OF PROCEDURE: 09/02/2007 DATE OF DISCHARGE:   OPERATIVE REPORT  PRIMARY CARE PHYSICIAN: Conchita Paris, M.D.  SURGEON: Michael Boston, M.D.  ASSISTANT: Edsel Petrin. Dalbert Batman, M.D., as well as Carron Curie, P.A. student.  PREOPERATIVE DIAGNOSIS: History of prior abdominal surgeries and ventral hernia with paracolostomy hernia, possible midline ventral hernia.  POSTOPERATIVE DIAGNOSES: 1. Left lower quadrant incarcerated ventral hernia from old colostomy  site, 9 x 14 cm in size. 2. Extensive abdominal wall adhesions to anterior abdominal wall. 3. No evidence of any significant ventral hernia.  PROCEDURE PERFORMED: 1. Laparoscopic lysis of adhesions x150 minutes (equals 75%). 2. Laparoscopic ventral hernia repair with 20 x 30 cm  Parietex/Seprafilm dual-sided mesh. OPERATIVE FINDINGS: He had very dense adhesions onto his midline involving numerous loops of small intestine and omentum. He had numerous adhesions on his left lateral abdomen as well of omentum along with incarcerated hernia through his colostomy site about 9 x 14 cm in size with omentum and small intestine within it. He had no strong evidence of a midline incisional hernia with prior mesh intact. REMOVAL, SUTURE, WITH ANESTHESIA (60454) 2011 Removal of stitch abscesses at old LLQ colostomy site and midline  Diagnostic Studies History [name], MD; 05/05/2016 10:09 AM) Colonoscopy >10 years ago  Allergies Elbert Ewings, CMA; 05/05/2016 10:02 AM) Latex Exam Gloves *MEDICAL DEVICES AND SUPPLIES*  Medication History Elbert Ewings, CMA; 05/05/2016 10:03 AM) Lovaza (1GM Capsule, Oral) Active. Medications Reconciled  Social  History [name], MD; 05/05/2016 10:09 AM) Alcohol use Remotely quit alcohol use. Caffeine use Coffee. No drug use Tobacco use Former smoker.  Family History [name], MD; 05/05/2016 10:09 AM) Alcohol Abuse Brother, Father, Sister. Depression Sister.    Vitals Elbert Ewings CMA; 05/05/2016 10:03 AM) 05/05/2016 10:03 AM Weight: 198 lb Height: 68in Body Surface Area: 2.04 m Body Mass Index: 30.11 kg/m  Temp.: 97.74F(Temporal)  Pulse: 86 (Regular)  BP: 128/78 (Sitting, Left Arm, Standard)  BP (!) 174/78 Comment: RN notified  Pulse 83   Temp 97.8 F (36.6 C) (Oral)   Resp 18   Ht 5\' 8"  (1.727 m)   Wt 91.2 kg (201 lb)   SpO2 96%   BMI 30.56 kg/m      Physical Exam [name] MD; 05/05/2016 1:31 PM)  General Mental Status-Alert. General Appearance-Not in acute distress. Voice-Normal.  Integumentary Global Assessment Normal Exam - Distribution of scalp and body hair is normal. General Characteristics Overall Skin Surface - no rashes and no suspicious lesions.  Head and Neck Head-normocephalic, atraumatic with no lesions or palpable masses. Face Global Assessment - atraumatic, no absence of expression. Neck Global Assessment - no abnormal movements, no decreased range of motion. Trachea-midline. Thyroid Gland Characteristics - non-tender.  Eye Eyeball - Left-Extraocular movements intact, No Nystagmus. Eyeball - Right-Extraocular movements intact, No Nystagmus. Upper Eyelid - Left-No Cyanotic. Upper Eyelid - Right-No Cyanotic. Note: Wears glasses. Vision acceptable  Chest and Lung Exam Inspection Accessory muscles - No use of accessory muscles in breathing.  Abdomen Note: Abdomen obese but doughy soft. No pain or guarding. No peritonitis. No drainage. A few subcutaneous stitches can be felt at old colostomy site but no drainage or erythema or pain. No hernias  Male Genitourinary Note:  No inguinal hernias. No drainage.  Peripheral Vascular Upper Extremity Inspection - Left - Not Gangrenous, No Petechiae. Right - Not Gangrenous,  No Petechiae.  Neurologic Neurologic evaluation reveals -normal attention span and ability to concentrate, able to name objects and repeat phrases. Appropriate fund of knowledge and normal coordination.  Neuropsychiatric Mental status exam performed with findings of-able to articulate well with normal speech/language, rate, volume and coherence and no evidence of hallucinations, delusions, obsessions or homicidal/suicidal ideation. Orientation-oriented X3.  Musculoskeletal Global Assessment Gait and Station - normal gait and station.  Lymphatic General Lymphatics Description - No Generalized lymphadenopathy.    Assessment & Plan   SMALL BOWEL OBSTRUCTION DUE TO ADHESIONS (K56.5) Impression: Recurrent small bowel obstructions in October, January, March, May  A long discussion with the patient is wife. I think he requires diagnostic laparoscopy with lysis of adhesions. Make sure he doesn't have a stricture that required bowel resection. He's had numerous surgeries and significant adhesions, so risks of bowel enterotomy and other issues are elevated, but he is having readmissions every 2 months now so I don't think things are coming down. He is at the point where he agrees and needs to break the cycle of surgery. His wife is having back surgery next week though, so they wish to wait a few weeks so that she isn't a better place to help him out. They also may be able to get their son to come in and help as well. I strongly recommend that they have plan be in support to help him out.  I recommend continuing well chewed frequent smaller meals. He is tried to do liquids and pureed foods when he feels obstructions but still goes to the ER. Avoid severe constipation or diarrhea. He was hoping I could direct admit him if he gets another  SBO. I can see if we can try, but sometimes a challenge to get people admitted right away, especially if they have issues after hours.  I have re-reviewed the the patient's records, history, medications, and allergies.  I have re-examined the patient.  I again discussed intraoperative plans and goals of post-operative recovery.  The patient agrees to proceed.   Current Plans Pt Education - Adhesions: discussed with patient and provided information. Pt Education - Patient education: Small bowel obstruction (The Basics): discussed with patient and provided information. Pt Education - [facility] Good Bowel Health ([facility] [place]) You are being scheduled for surgery - Our schedulers will call you.  You should hear from our office's scheduling department within 5 working days about the location, date, and time of surgery. We try to make accommodations for patient's preferences in scheduling surgery, but sometimes the OR schedule or the surgeon's schedule prevents [place] from making those accommodations.  If you have not heard from our office [phone]) in 5 working days, call the office and ask for your surgeon's nurse.  If you have other questions about your diagnosis, plan, or surgery, call the office and ask for your surgeon's nurse.  The anatomy & physiology of the digestive tract was discussed. The pathophysiology of intestinal obstruction was discussed. Natural history risks without surgery was discussed. Differential diagnosis discuseed. I feel the patient has failed non-operative therapies. The risks of no intervention will lead to serious problems such as necrosis, perforation, dehydration, etc. that outweigh the operative risks; therefore, I recommended abdominal exploration to diagnose & treat the source of the problem. Minimally Invasive & open techniques were discussed. I expressed a good likelihood that surgery will treat the problem.  Risks such as bleeding, infection, abscess, leak, reoperation,  bowel resection, possible ostomy, hernia, heart attack, death, and other risks  were discussed. I noted a good likelihood this will help address the problem. Goals of post-operative recovery were discussed as well. We will work to minimize complications. Questions were answered. The patient expresses understanding & wishes to proceed with surgery.  Pt Education - [facility] Colectomy post-op instructions: discussed with patient and provided information.  [name], M.D., F.A.C.S. Gastrointestinal and Minimally Invasive Surgery [facility] [address], [place], [place] [address] [phone] Main / Paging

## 2016-06-25 NOTE — Progress Notes (Signed)
Pt c/o right eye pain, sensitive to light; Dr. Ermalene Postin , anesthesiologist, in to check pt; order rec'd, OK to go to pt's room

## 2016-06-25 NOTE — Op Note (Signed)
06/25/2016  12:19 PM  PATIENT:  [name]  75 y.o. male  Patient Care Team: Darcus Austin, MD as PCP - General (Family Medicine) Druscilla Brownie, MD (Dermatology) Carolan Clines, MD as Attending Physician (Urology) Michael Boston, MD as Consulting Physician (General Surgery)  PRE-OPERATIVE DIAGNOSIS:  Recurrent small bowel obstructions   POST-OPERATIVE DIAGNOSIS:    Recurrent small bowel obstructions from dense interloop and anterior abdominal wall adhesions  PROCEDURE:  LAPAROSCOPIC LYSIS OF ADHESIONS  Surgeon(s): Michael Boston, MD  ASSISTANT: none   ANESTHESIA:   local and general  EBL:  Total I/O In: 2000 [I.V.:2000] Out: 210 [Urine:160; Blood:50]  Delay start of Pharmacological VTE agent (>24hrs) due to surgical blood loss or risk of bleeding:  no  DRAINS: none   SPECIMEN:  No Specimen  DISPOSITION OF SPECIMEN:  N/A  COUNTS:  YES  PLAN OF CARE: Admit to inpatient   PATIENT DISPOSITION:  PACU - hemodynamically stable.  INDICATION:   Pleasant gentleman with history of colectomy and colostomy for perforated diverticulitis.  Developed hernias requiring repeated surgeries after the colostomy takedown.  Has had recurrent small bowel obstructions with three admissions this year.  Transition zone seems to be adherent to the mid anterior abdominal wall.  I recommended diagnostic laparoscopy with lysis adhesions to hopefully on cake the chronically obstructed region or regions.  Possible small bowel obstruction or stricture frown.  Possible risk of conversion to open.  Patient agreed to proceed  The anatomy & physiology of the digestive tract was discussed.  The pathophysiology of intestinal obstruction was discussed.  Natural history risks without surgery was discussed.   I feel the patient has failed non-operative therapies.  The risks of no intervention will lead to serious problems such as necrosis, perforation, dehydration, etc. that outweigh the operative risks;  therefore, I recommended abdominal exploration to diagnose & treat the source of the problem.  Minimally Invasive & open techniques were discussed.   I expressed a good likelihood that surgery will treat the problem.  Risks such as bleeding, infection, abscess, leak, reoperation, bowel resection, possible ostomy, hernia, injury to other organs, need for repair of tissues / organs, need for further treatment, heart attack, death, and other risks were discussed.   I noted a good likelihood this will help address the problem.  Goals of post-operative recovery were discussed as well.  We will work to minimize complications. Questions were answered.  The patient expresses understanding & wishes to proceed with surgery.   OR FINDINGS: Very dense adhesions to the anterior abdominal wall, especially the prior giant sheet of mesh involving the lower two thirds of his abdomen.  Very dense interloop adhesions of small bowel.  Upper third omental adhesions.  Lower two thirds small bowel adhesions.  Numerous transition points.  Two gratis and mid jejunum with small jejunal diverticulum.  Another one also at the proximal ileum.  A few retroperitoneal transition point is well untwisted.  Liver, gallbladder, stomach, colon, and appendix, etc. normal without any evidence of diverticulitis nor obstruction nor stricture nor abnormality.  DESCRIPTION:   Informed consent was confirmed.  The patient underwent general anaesthesia without difficulty.  The patient was positioned appropriately.  VTE prevention in place.  The patient's abdomen was clipped, prepped, & draped in a sterile fashion.  Surgical timeout confirmed our plan.  The patient was positioned in reverse Trendelenburg.  Abdominal entry with a 27mm laparoscopic port was gained using optical entry technique in the right upper abdomen.  Entry was clean.  I induced carbon dioxide insufflation.  Camera inspection revealed no injury.  Extra 19mm ports were carefully  placed under direct laparoscopic visualization.  I proceeded with left Place adhesions using cold scissors.  I worked to free the greater omentum off the right upper quadrant and left upper quadrant.  Most dense adhesions in the central abdomen involving small bowel.  I therefore her small bowel loops down in the suprapubic regions and worked way towards the central region.  Mentioned with careful sharp dissection was able to free all adhesions off the anterior abdominal wall.  Then chose an area in the ileum and began to run the small bowel.  There are numerous areas of clamping and twisting consistent with kinking from adhesions to the anterior abdominal wall.  We freed those off using again cold sharp scissor dissection.  Eventually was able to come more approximately to ligament of Treitz.  I ran the small bowel more distally and freed further adhesions especially down the ileum.  The two most suspicious transition points were obviously chronically dilated small bowel with some decreased bowel noted in the mid jejunum.  Carefully freeing off dense hairpin turn a small jejunal diverticulum was noted nearby as well.  It was not thinned out nor perforated nor inflamed.  I left it in situ.  There was a small mesenteric bleeder near this region of dissection.  Eventually controlled with 3-0 Vicryl intracorporeal suturing to good result.  To minimize cautery exposure given the fact it was near the mesenteric serosa.  Another transition zone was in the mid ileum that was rather dilated.  Geroge Baseman for closed loop since the ileum proximal and distal to it were more normal caliber.  Kinks of small bowel to the retroperitoneum and deep small bowel mesentery were carefully freed off as well.  I again ran the small bowel from the terminal ileum to the ligament of Treitz.  I ran back distally and then back again proximally to have meticulous inspection of small bowel three times.  There is no evidence of any  transition point, kinking, enterotomy, nor serosal injury.  Allowed the small bowel fall back down the abdomen.  I mobilized greater omentum off the upper quadrants laterally and help bring that down so that it laid well infraumbilically.  I did copious irrigation.  Hemostasis good.  I evacuated carbon dioxide.  Remove the ports.  I closed port sites with 4 Monocryl suture.  Sterile dressings applied.  Patient being extubated go to recovery room.  I discussed postoperative findings with the patient's wife.  Expected plans and recovery was discussed as well.  She expressed understanding and appreciation.  [name], M.D., F.A.C.S. Gastrointestinal and Minimally Invasive Surgery [facility] [address], [place], [place] [address] [phone] Main / Paging

## 2016-06-25 NOTE — Discharge Instructions (Signed)
LAPAROSCOPIC SURGERY: POST OP INSTRUCTIONS ° °###################################################################### ° °EAT °Gradually transition to a high fiber diet with a fiber supplement over the next few weeks after discharge.  Start with a pureed / full liquid diet (see below) ° °WALK °Walk an hour a day.  Control your pain to do that.   ° °CONTROL PAIN °Control pain so that you can walk, sleep, tolerate sneezing/coughing, go up/down stairs. ° °HAVE A BOWEL MOVEMENT DAILY °Keep your bowels regular to avoid problems.  OK to try a laxative to override constipation.  OK to use an antidairrheal to slow down diarrhea.  Call if not better after 2 tries ° °CALL IF YOU HAVE PROBLEMS/CONCERNS °Call if you are still struggling despite following these instructions. °Call if you have concerns not answered by these instructions ° °###################################################################### ° ° ° °1. DIET: Follow a light bland diet the first 24 hours after arrival home, such as soup, liquids, crackers, etc.  Be sure to include lots of fluids daily.  Avoid fast food or heavy meals as your are more likely to get nauseated.  Eat a low fat the next few days after surgery.   °2. Take your usually prescribed home medications unless otherwise directed. °3. PAIN CONTROL: °a. Pain is best controlled by a usual combination of three different methods TOGETHER: °i. Ice/Heat °ii. Over the counter pain medication °iii. Prescription pain medication °b. Most patients will experience some swelling and bruising around the incisions.  Ice packs or heating pads (30-60 minutes up to 6 times a day) will help. Use ice for the first few days to help decrease swelling and bruising, then switch to heat to help relax tight/sore spots and speed recovery.  Some people prefer to use ice alone, heat alone, alternating between ice & heat.  Experiment to what works for you.  Swelling and bruising can take several weeks to resolve.   °c. It is  helpful to take an over-the-counter pain medication regularly for the first few weeks.  Choose one of the following that works best for you: °i. Naproxen (Aleve, etc)  Two 220mg tabs twice a day °ii. Ibuprofen (Advil, etc) Three 200mg tabs four times a day (every meal & bedtime) °iii. Acetaminophen (Tylenol, etc) 500-650mg four times a day (every meal & bedtime) °d. A  prescription for pain medication (such as oxycodone, hydrocodone, etc) should be given to you upon discharge.  Take your pain medication as prescribed.  °i. If you are having problems/concerns with the prescription medicine (does not control pain, nausea, vomiting, rash, itching, etc), please call us [phone] to see if we need to switch you to a different pain medicine that will work better for you and/or control your side effect better. °ii. If you need a refill on your pain medication, please contact your pharmacy.  They will contact our office to request authorization. Prescriptions will not be filled after 5 pm or on week-ends. °4. Avoid getting constipated.  Between the surgery and the pain medications, it is common to experience some constipation.  Increasing fluid intake and taking a fiber supplement (such as Metamucil, Citrucel, FiberCon, MiraLax, etc) 1-2 times a day regularly will usually help prevent this problem from occurring.  A mild laxative (prune juice, Milk of Magnesia, MiraLax, etc) should be taken according to package directions if there are no bowel movements after 48 hours.   °5. Watch out for diarrhea.  If you have many loose bowel movements, simplify your diet to bland foods & liquids for   a few days.  Stop any stool softeners and decrease your fiber supplement.  Switching to mild anti-diarrheal medications (Kayopectate, Pepto Bismol) can help.  If this worsens or does not improve, please call us. °6. Wash / shower every day.  You may shower over the dressings as they are waterproof.  Continue to shower over incision(s)  after the dressing is off. °7. Remove your waterproof bandages 5 days after surgery.  You may leave the incision open to air.  You may replace a dressing/Band-Aid to cover the incision for comfort if you wish.  °8. ACTIVITIES as tolerated:   °a. You may resume regular (light) daily activities beginning the next day--such as daily self-care, walking, climbing stairs--gradually increasing activities as tolerated.  If you can walk 30 minutes without difficulty, it is safe to try more intense activity such as jogging, treadmill, bicycling, low-impact aerobics, swimming, etc. °b. Save the most intensive and strenuous activity for last such as sit-ups, heavy lifting, contact sports, etc  Refrain from any heavy lifting or straining until you are off narcotics for pain control.   °c. DO NOT PUSH THROUGH PAIN.  Let pain be your guide: If it hurts to do something, don't do it.  Pain is your body warning you to avoid that activity for another week until the pain goes down. °d. You may drive when you are no longer taking prescription pain medication, you can comfortably wear a seatbelt, and you can safely maneuver your car and apply brakes. °e. You may have sexual intercourse when it is comfortable.  °9. FOLLOW UP in our office °a. Please call [facility] at [phone] to set up an appointment to see your surgeon in the office for a follow-up appointment approximately 2-3 weeks after your surgery. °b. Make sure that you call for this appointment the day you arrive home to insure a convenient appointment time. °10. IF YOU HAVE DISABILITY OR FAMILY LEAVE FORMS, BRING THEM TO THE OFFICE FOR PROCESSING.  DO NOT GIVE THEM TO YOUR DOCTOR. ° ° °WHEN TO CALL US [phone]: °1. Poor pain control °2. Reactions / problems with new medications (rash/itching, nausea, etc)  °3. Fever over 101.5 F (38.5 C) °4. Inability to urinate °5. Nausea and/or vomiting °6. Worsening swelling or bruising °7. Continued bleeding from incision. °8. Increased  pain, redness, or drainage from the incision ° ° The clinic staff is available to answer your questions during regular business hours (8:30am-5pm).  Please don’t hesitate to call and ask to speak to one of our nurses for clinical concerns.  ° If you have a medical emergency, go to the nearest emergency room or call 911. ° A surgeon from [facility] is always on call at the hospitals ° ° °[facility] °[address], [address], [place], [place]  [address] ? °MAIN: [phone] ? TOLL FREE: [phone] ?  °FAX [phone] °[email or web address] ° ° °

## 2016-06-25 NOTE — Anesthesia Procedure Notes (Signed)
Procedure Name: Intubation Date/Time: 06/25/2016 7:53 AM Performed by: West Pugh Pre-anesthesia Checklist: Patient identified, Emergency Drugs available, Suction available, Patient being monitored and Timeout performed Patient Re-evaluated:Patient Re-evaluated prior to inductionOxygen Delivery Method: Circle system utilized Preoxygenation: Pre-oxygenation with 100% oxygen Intubation Type: IV induction and Cricoid Pressure applied Ventilation: Mask ventilation without difficulty Laryngoscope Size: Mac and 4 Grade View: Grade II Tube type: Oral Tube size: 7.5 mm Number of attempts: 1 Airway Equipment and Method: Stylet Placement Confirmation: ETT inserted through vocal cords under direct vision,  positive ETCO2,  CO2 detector and breath sounds checked- equal and bilateral Secured at: 23 cm Tube secured with: Tape Dental Injury: Teeth and Oropharynx as per pre-operative assessment

## 2016-06-25 NOTE — Transfer of Care (Signed)
Immediate Anesthesia Transfer of Care Note  Patient: Matthew Hodges  Procedure(s) Performed: Procedure(s): LAPAROSCOPIC LYSIS OF ADHESIONS (N/A)  Patient Location: PACU  Anesthesia Type:General  Level of Consciousness:  sedated, patient cooperative and responds to stimulation  Airway & Oxygen Therapy:Patient Spontanous Breathing and Patient connected to face mask oxgen  Post-op Assessment:  Report given to PACU RN and Post -op Vital signs reviewed and stable  Post vital signs:  Reviewed and stable  Last Vitals:  Vitals:   06/25/16 0556  BP: (!) 174/78  Pulse: 83  Resp: 18  Temp: 123XX123 C    Complications: No apparent anesthesia complications

## 2016-06-25 NOTE — Anesthesia Postprocedure Evaluation (Signed)
Anesthesia Post Note  Patient: Matthew Hodges  Procedure(s) Performed: Procedure(s) (LRB): LAPAROSCOPIC LYSIS OF ADHESIONS (N/A)  Patient location during evaluation: PACU Anesthesia Type: General Level of consciousness: awake Pain management: pain level controlled Vital Signs Assessment: post-procedure vital signs reviewed and stable Respiratory status: spontaneous breathing Cardiovascular status: stable Postop Assessment: no signs of nausea or vomiting Anesthetic complications: no    Last Vitals:  Vitals:   06/25/16 1632 06/25/16 1742  BP: (!) 151/78 (!) 161/56  Pulse: 73 74  Resp: 13 13  Temp: 36.6 C 36.7 C    Last Pain:  Vitals:   06/25/16 1742  TempSrc: Oral  PainSc:                  Michaella Imai

## 2016-06-26 LAB — BASIC METABOLIC PANEL
ANION GAP: 6 (ref 5–15)
BUN: 14 mg/dL (ref 6–20)
CHLORIDE: 104 mmol/L (ref 101–111)
CO2: 29 mmol/L (ref 22–32)
Calcium: 8.6 mg/dL — ABNORMAL LOW (ref 8.9–10.3)
Creatinine, Ser: 1.1 mg/dL (ref 0.61–1.24)
GFR calc Af Amer: >60 mL/min (ref >60)
GFR calc non Af Amer: >60 mL/min (ref >60)
GLUCOSE: 103 mg/dL — AB (ref 65–99)
POTASSIUM: 4.1 mmol/L (ref 3.5–5.1)
Sodium: 139 mmol/L (ref 135–145)

## 2016-06-26 LAB — CBC
HEMATOCRIT: 34.5 % — AB (ref 39.0–52.0)
HEMOGLOBIN: 11.7 g/dL — AB (ref 13.0–17.0)
MCH: 31.6 pg (ref 26.0–34.0)
MCHC: 33.9 g/dL (ref 30.0–36.0)
MCV: 93.2 fL (ref 78.0–100.0)
Platelets: 188 10*3/uL (ref 150–400)
RBC: 3.7 MIL/uL — ABNORMAL LOW (ref 4.22–5.81)
RDW: 13.1 % (ref 11.5–15.5)
WBC: 6.9 10*3/uL (ref 4.0–10.5)

## 2016-06-26 LAB — TYPE AND SCREEN
ABO/RH(D): B POS
Antibody Screen: NEGATIVE

## 2016-06-26 MED ORDER — LACTATED RINGERS IV BOLUS (SEPSIS): 1000.0000 mL | Freq: Three times a day (TID) | INTRAVENOUS | Status: DC | PRN

## 2016-06-26 MED ORDER — SODIUM CHLORIDE 0.9% FLUSH: 3.0000 mL | Freq: Two times a day (BID) | INTRAVENOUS | Status: DC

## 2016-06-26 MED ORDER — SODIUM CHLORIDE 0.9% FLUSH: 3.0000 mL | INTRAVENOUS | Status: DC | PRN

## 2016-06-26 MED ORDER — SODIUM CHLORIDE 0.9 % IV SOLN: 250.0000 mL | INTRAVENOUS | Status: DC | PRN

## 2016-06-26 NOTE — Progress Notes (Signed)
Patient given discharge instructions, and verbalized an understanding of all discharge instructions.  Patient agrees with discharge plan, and is being discharged in stable medical condition.  

## 2016-06-27 NOTE — Discharge Summary (Signed)
Physician Discharge Summary  Patient ID: Matthew Hodges MRN: [id number] DOB/AGE: Jun 19, 1941 75 y.o.  Admit date: 06/25/2016 Discharge date: 06/26/2016  Patient Care Team: Darcus Austin, MD as PCP - General (Family Medicine) Druscilla Brownie, MD (Dermatology) Carolan Clines, MD as Attending Physician (Urology) Michael Boston, MD as Consulting Physician (General Surgery)  Admission Diagnoses: Active Problems:   SBO (small bowel obstruction) ([place])   Discharge Diagnoses:  Active Problems:   SBO (small bowel obstruction) ([facility])   POST-OPERATIVE DIAGNOSIS:   Recurrent small bowel obstructions   SURGERY:  06/25/2016  Procedure(s): LAPAROSCOPIC LYSIS OF ADHESIONS  SURGEON:    Surgeon(s): Michael Boston, MD  Consults: None  Hospital Course:   The patient underwent the surgery above.  Postoperatively, the patient gradually mobilized and advanced to a solid diet.  Pain and other symptoms were treated aggressively.    By the time of discharge, the patient was walking well the hallways, eating food, having flatus.  Pain was well-controlled on an oral medications.  Based on meeting discharge criteria and continuing to recover, I felt it was safe for the patient to be discharged from the hospital to further recover with close followup. Postoperative recommendations were discussed in detail.  They are written as well.   Significant Diagnostic Studies:  Results for orders placed or performed during the hospital encounter of 06/25/16 (from the past 72 hour(s))  Basic metabolic panel     Status: Abnormal   Collection Time: 06/26/16  4:07 AM  Result Value Ref Range   Sodium 139 135 - 145 mmol/L   Potassium 4.1 3.5 - 5.1 mmol/L   Chloride 104 101 - 111 mmol/L   CO2 29 22 - 32 mmol/L   Glucose, Bld 103 (H) 65 - 99 mg/dL   BUN 14 6 - 20 mg/dL   Creatinine, Ser 1.10 0.61 - 1.24 mg/dL   Calcium 8.6 (L) 8.9 - 10.3 mg/dL   GFR calc non Af Amer >60 >60 mL/min   GFR calc Af Amer >60 >60  mL/min    Comment: (NOTE) The eGFR has been calculated using the CKD EPI equation. This calculation has not been validated in all clinical situations. eGFR's persistently <60 mL/min signify possible Chronic Kidney Disease.    Anion gap 6 5 - 15  CBC     Status: Abnormal   Collection Time: 06/26/16  6:11 AM  Result Value Ref Range   WBC 6.9 4.0 - 10.5 K/uL   RBC 3.70 (L) 4.22 - 5.81 MIL/uL   Hemoglobin 11.7 (L) 13.0 - 17.0 g/dL   HCT 34.5 (L) 39.0 - 52.0 %   MCV 93.2 78.0 - 100.0 fL   MCH 31.6 26.0 - 34.0 pg   MCHC 33.9 30.0 - 36.0 g/dL   RDW 13.1 11.5 - 15.5 %   Platelets 188 150 - 400 K/uL    No results found.  Discharge Exam: Blood pressure (!) 143/64, pulse 72, temperature 97.5 F (36.4 C), temperature source Oral, resp. rate 16, height _0  (1.727 m), weight 91.2 kg (201 lb), SpO2 93 %.  General: Pt awake/alert/oriented x4 in no major acute distress Eyes: PERRL, normal EOM. Sclera nonicteric Neuro: CN II-XII intact w/o focal sensory/motor deficits. Lymph: No head/neck/groin lymphadenopathy Psych:  No delerium/psychosis/paranoia HENT: Normocephalic, Mucus membranes moist.  No thrush Neck: Supple, No tracheal deviation Chest: No pain.  Good respiratory excursion. CV:  Pulses intact.  Regular rhythm MS: Normal AROM mjr joints.  No obvious deformity Abdomen: Soft, Nondistended.  Min tender at  incisions only.  No incarcerated hernias. Ext:  SCDs BLE.  No significant edema.  No cyanosis Skin: No petechiae / purpura  Discharged Condition: good   Past Medical History:  Diagnosis Date  . Basal cell carcinoma    "sqattered"  . Cerebral artery disease   . Diverticular disease   . Hearing impaired    bilateral hearing aids  . Heart murmur    "after surgeries before"  . Hyperlipidemia   . Hypertension    hx. of . no meds since 2004  . Prostate cancer [facility])    treated with radiation  . SBO (small bowel obstruction) ([place]) 03/2016   last 04-21-16  admission([facility])"multiple occurrences"  . Squamous carcinoma ([facility])    "sqattered"Scalp 1 polyethelene skin prostesis left frontal scalp area-removable.  . Stroke ([place]) 1979   no deficits    Past Surgical History:  Procedure Laterality Date  . COLON SURGERY    . COLOSTOMY TAKEDOWN  08/1999  . HERNIA REPAIR     "lots; had diverticulitis years ago"  . INCISIONAL HERNIA REPAIR  2001   open w mesh  . LAPAROSCOPIC INCISIONAL / UMBILICAL / VENTRAL HERNIA REPAIR  2008   with mesh.  LLQ old colostomy site  . LAPAROSCOPIC LYSIS OF ADHESIONS N/A 06/25/2016   Procedure: LAPAROSCOPIC LYSIS OF ADHESIONS;  Surgeon: Michael Boston, MD;  Location: [facility];  Service: General;  Laterality: N/A;  . LYSIS OF ADHESION N/A 04/23/2015   Procedure: LYSIS OF ADHESION;  Surgeon: Michael Boston, MD;  Location: [facility];  Service: General;  Laterality: N/A;  . PARTIAL COLECTOMY  01/1999   Hartmans procedure with colostomy  . PROSTATE BIOPSY  ~ 2002  . REMOVE SUTURE UNDER ANESTHESIA  2011   stitch abscesses LLQ & midline  . SMALL BOWEL REPAIR  03/1999   postop abscesses with fistula.  Abscesses drained.  Fistula repaired.  . VENTRAL HERNIA REPAIR N/A 04/23/2015   Procedure: LAPAROSCOPIC VENTRAL WALL HERNIA REPAIR AND LAP LYSIS OF ADHESIONS;  Surgeon: Michael Boston, MD;  Location: [facility];  Service: General;  Laterality: N/A;  With MESH    Social History   Social History  . Marital status: Married    Spouse name: N/A  . Number of children: N/A  . Years of education: N/A   Occupational History  . Not on file.   Social History Main Topics  . Smoking status: Former Smoker    Packs/day: 2.00    Years: 47.00    Types: Cigarettes    Quit date: 11/30/2004  . Smokeless tobacco: Never Used  . Alcohol use Yes     Comment: Quit-recovering alcoholic sober since 0156  . Drug use: No  . Sexual activity: Not Currently   Other Topics Concern  . Not on file   Social History Narrative  . No narrative on file    History  reviewed. No pertinent family history.  No current facility-administered medications for this encounter.    Current Outpatient Prescriptions  Medication Sig Dispense Refill  . aspirin EC 81 MG tablet Take 81 mg by mouth every morning.     Marland Kitchen atorvastatin (LIPITOR) 40 MG tablet Take 20 mg by mouth at bedtime.    . Cholecalciferol (VITAMIN D3) 400 UNITS tablet Take 400 Units by mouth every morning.     . docusate sodium (COLACE) 100 MG capsule Take 1 capsule (100 mg total) by mouth 2 (two) times daily. 10 capsule 0  . mirabegron ER (MYRBETRIQ) 25 MG TB24 tablet Take  25 mg by mouth every morning.    . Multiple Vitamins-Minerals (MEGA MULTIVITAMIN FOR MEN PO) Take 1 tablet by mouth every morning.     Marland Kitchen omega-3 acid ethyl esters (LOVAZA) 1 G capsule Take 1 g by mouth every morning.     . traMADol (ULTRAM) 50 MG tablet Take 1-2 tablets (50-100 mg total) by mouth every 6 (six) hours as needed for moderate pain or severe pain. 30 tablet 0     Allergies  Allergen Reactions  . Latex Rash    Prolong contact     Disposition: 01-Home or Self Care  Discharge Instructions    Call MD for:    Complete by:  As directed   Temperature > 101.27F   Call MD for:    Complete by:  As directed   Temperature > 101.27F   Call MD for:  extreme fatigue    Complete by:  As directed   Call MD for:  extreme fatigue    Complete by:  As directed   Call MD for:  hives    Complete by:  As directed   Call MD for:  hives    Complete by:  As directed   Call MD for:  persistant nausea and vomiting    Complete by:  As directed   Call MD for:  persistant nausea and vomiting    Complete by:  As directed   Call MD for:  redness, tenderness, or signs of infection (pain, swelling, redness, odor or green/yellow discharge around incision site)    Complete by:  As directed   Call MD for:  redness, tenderness, or signs of infection (pain, swelling, redness, odor or green/yellow discharge around incision site)    Complete by:  As  directed   Call MD for:  severe uncontrolled pain    Complete by:  As directed   Call MD for:  severe uncontrolled pain    Complete by:  As directed   Diet - low sodium heart healthy    Complete by:  As directed   Start with bland, low residue diet for a few days, then advance to a heart healthy (low fat, high fiber) diet.  If you feel nauseated or constipated, simplify to a liquid only diet for 48 hours until you are feeling better (no more nausea, farting/passing gas, having a bowel movement, etc...).  If you cannot tolerate even drinking liquids, or feeling worse, let your surgeon know or go to the Emergency Department for help.   Discharge instructions    Complete by:  As directed   Please see discharge instruction sheets.   Also refer to any handouts/printouts that may have been given from the [facility] surgery office (if you visited [place] there before surgery) Please call our office if you have any questions or concerns [phone]   Discharge instructions    Complete by:  As directed   Please see discharge instruction sheets.   Also refer to any handouts/printouts that may have been given from the [facility] surgery office (if you visited [place] there before surgery) Please call our office if you have any questions or concerns [phone]   Discharge wound care:    Complete by:  As directed   If you have closed incisions: Shower and bathe over these incisions with soap and water every day.  It is OK to wash over the dressings: they are waterproof. Remove all surgical dressings on postoperative day #3.  You do not need to replace  dressings over the closed incisions unless you feel more comfortable with a Band-Aid covering it.   If you have an open wound: That requires packing, so please see wound care instructions.   In general, remove all dressings, wash wound with soap and water and then replace with saline moistened gauze.  Do the dressing change at least every day.    Please call our office  [phone] if you have further questions.   Discharge wound care:    Complete by:  As directed   If you have closed incisions: Shower and bathe over these incisions with soap and water every day.  It is OK to wash over the dressings: they are waterproof. Remove all surgical dressings on postoperative day #3.  You do not need to replace dressings over the closed incisions unless you feel more comfortable with a Band-Aid covering it.   If you have an open wound: That requires packing, so please see wound care instructions.   In general, remove all dressings, wash wound with soap and water and then replace with saline moistened gauze.  Do the dressing change at least every day.    Please call our office [phone] if you have further questions.   Driving Restrictions    Complete by:  As directed   No driving until off narcotics and can safely swerve away without pain during an emergency   Driving Restrictions    Complete by:  As directed   No driving until off narcotics and can safely swerve away without pain during an emergency   Increase activity slowly    Complete by:  As directed   Increase activity slowly    Complete by:  As directed   Lifting restrictions    Complete by:  As directed   Avoid heavy lifting initially, <20 pounds at first.   Do not push through pain.   You have no specific weight limit: If it hurts to do, DON'T DO IT.    If you feel no pain, you are not injuring anything.  Pain will protect you from injury.   Coughing and sneezing are far more stressful to your incision than any lifting.   Avoid resuming heavy lifting (>50 pounds) or other intense activity until off all narcotic pain medications.   When want to exercise more, give yourself 2 weeks to gradually get back to full intense exercise/activity.   Lifting restrictions    Complete by:  As directed   Avoid heavy lifting initially, <20 pounds at first.   Do not push through pain.   You have no specific weight  limit: If it hurts to do, DON'T DO IT.    If you feel no pain, you are not injuring anything.  Pain will protect you from injury.   Coughing and sneezing are far more stressful to your incision than any lifting.   Avoid resuming heavy lifting (>50 pounds) or other intense activity until off all narcotic pain medications.   When want to exercise more, give yourself 2 weeks to gradually get back to full intense exercise/activity.   May shower / Bathe    Complete by:  As directed   [place].  It is fine for dressings or wounds to be washed/rinsed.  Use gentle soap & water.  This will help the incisions and/or wounds get clean & minimize infection.   May shower / Bathe    Complete by:  As directed   [place].  It is fine for  dressings or wounds to be washed/rinsed.  Use gentle soap & water.  This will help the incisions and/or wounds get clean & minimize infection.   May walk up steps    Complete by:  As directed   May walk up steps    Complete by:  As directed   Sexual Activity Restrictions    Complete by:  As directed   Sexual activity as tolerated.  Do not push through pain.  Pain will protect you from injury.   Sexual Activity Restrictions    Complete by:  As directed   Sexual activity as tolerated.  Do not push through pain.  Pain will protect you from injury.   Walk with assistance    Complete by:  As directed   Walk over an hour a day.  May use a walker/cane/companion to help with balance and stamina.   Walk with assistance    Complete by:  As directed   Walk over an hour a day.  May use a walker/cane/companion to help with balance and stamina.       Medication List    TAKE these medications   aspirin EC 81 MG tablet Take 81 mg by mouth every morning.   atorvastatin 40 MG tablet Commonly known as:  LIPITOR Take 20 mg by mouth at bedtime.   docusate sodium 100 MG capsule Commonly known as:  COLACE Take 1 capsule (100 mg total) by mouth 2 (two) times daily.   MEGA  MULTIVITAMIN FOR MEN PO Take 1 tablet by mouth every morning.   mirabegron ER 25 MG Tb24 tablet Commonly known as:  MYRBETRIQ Take 25 mg by mouth every morning.   omega-3 acid ethyl esters 1 g capsule Commonly known as:  LOVAZA Take 1 g by mouth every morning.   traMADol 50 MG tablet Commonly known as:  ULTRAM Take 1-2 tablets (50-100 mg total) by mouth every 6 (six) hours as needed for moderate pain or severe pain.   Vitamin D3 400 units tablet Take 400 Units by mouth every morning.      Follow-up Information    [name]., MD.   Specialty:  General Surgery Contact information: [address] [place] [address] [phone]        Schedule an appointment as soon as possible for a visit in 3 weeks to follow up.   Why:  To follow up after your operation, To follow up after your hospital stay           Signed: Morton Peters, M.D., F.A.C.S. Gastrointestinal and Minimally Invasive Surgery [facility] [address], [place], [place] [address] [phone] Main / Paging   06/27/2016, 7:06 AM

## 2016-09-29 ENCOUNTER — Other Ambulatory Visit: Payer: Self-pay | Admitting: Family Medicine

## 2016-09-29 DIAGNOSIS — R011 Cardiac murmur, unspecified: Secondary | ICD-10-CM

## 2016-10-15 ENCOUNTER — Other Ambulatory Visit: Payer: Self-pay

## 2016-10-15 ENCOUNTER — Ambulatory Visit (HOSPITAL_COMMUNITY): Payer: Medicare Other | Attending: Cardiology

## 2016-10-15 DIAGNOSIS — R011 Cardiac murmur, unspecified: Secondary | ICD-10-CM | POA: Diagnosis not present

## 2017-01-28 ENCOUNTER — Other Ambulatory Visit: Payer: Self-pay | Admitting: Family Medicine

## 2017-01-28 DIAGNOSIS — I739 Peripheral vascular disease, unspecified: Secondary | ICD-10-CM

## 2017-02-02 ENCOUNTER — Ambulatory Visit
Admission: RE | Admit: 2017-02-02 | Discharge: 2017-02-02 | Disposition: A | Discharge status: Home / Self Care | Payer: Medicare Other | Source: Ambulatory Visit | Attending: Family Medicine | Admitting: Family Medicine

## 2017-02-02 DIAGNOSIS — I739 Peripheral vascular disease, unspecified: Secondary | ICD-10-CM

## 2017-02-10 ENCOUNTER — Encounter: Payer: Self-pay | Admitting: Vascular Surgery

## 2017-02-10 ENCOUNTER — Ambulatory Visit (INDEPENDENT_AMBULATORY_CARE_PROVIDER_SITE_OTHER): Payer: Medicare Other | Admitting: Vascular Surgery

## 2017-02-10 VITALS — BP 138/72 | HR 72 | Temp 97.3°F | Resp 20 | Ht 68.5 in | Wt 202.0 lb

## 2017-02-10 DIAGNOSIS — I7409 Other arterial embolism and thrombosis of abdominal aorta: Secondary | ICD-10-CM | POA: Diagnosis not present

## 2017-02-10 NOTE — Progress Notes (Signed)
Patient name: Matthew Hodges MRN: [id number] DOB: Mar 02, 1941 Sex: male  REASON FOR CONSULT: Peripheral vascular disease. Referred by Dr. Inda Merlin.  HPI: Matthew Hodges is a 76 y.o. male, who I had previously seen in 2012 with an aortic stenosis. He had presented with an abdominal obstruction and underwent a CT scan. An incidental finding was a an aortic stenosis that was heavily calcified. At that time, he had mild bilateral lower extremity claudication and given the severe calcific nature of the plaque PTA and stenting would be associate with significant risk of aortic injury. The patient is had previous multiple abdominal operations and at that time I certainly did not favor aortofemoral bypass grafting.  The patient has noted increasing bilateral lower extremity claudication. He experiences pain in his hips and thighs which is brought on by ambulation and relieved with rest. This occurs at about 150-200 yards, especially when he walks uphill. He does not experience symptoms when he is simply standing or sitting. He denies any history of rest pain or nonhealing ulcers.  Have reviewed the records that were sent with the patient. He has also been referred for evaluation of spinal stenosis and reportedly has some evidence of mild spinal stenosis.  Past Medical History:  Diagnosis Date  . Basal cell carcinoma    "sqattered"  . Cerebral artery disease   . Diverticular disease   . Hearing impaired    bilateral hearing aids  . Heart murmur    "after surgeries before"  . Hyperlipidemia   . Hypertension    hx. of . no meds since 2004  . Prostate cancer [facility])    treated with radiation  . SBO (small bowel obstruction) 03/2016   last 04-21-16 admission([facility])"multiple occurrences"  . Squamous carcinoma    "sqattered"Scalp 1 polyethelene skin prostesis left frontal scalp area-removable.  . Stroke [facility]) 1979   no deficits    No family history on file. He did have an uncle and grandfather who  had heart disease in a young age.  SOCIAL HISTORY: Social History   Social History  . Marital status: Married    Spouse name: N/A  . Number of children: N/A  . Years of education: N/A   Occupational History  . Not on file.   Social History Main Topics  . Smoking status: Former Smoker    Packs/day: 2.00    Years: 47.00    Types: Cigarettes    Quit date: 11/30/2004  . Smokeless tobacco: Never Used  . Alcohol use Yes     Comment: Quit-recovering alcoholic sober since 0973  . Drug use: No  . Sexual activity: Not Currently   Other Topics Concern  . Not on file   Social History Narrative  . No narrative on file    Allergies  Allergen Reactions  . Latex Rash    Prolong contact     Current Outpatient Prescriptions  Medication Sig Dispense Refill  . aspirin EC 81 MG tablet Take 81 mg by mouth every morning.     Marland Kitchen atorvastatin (LIPITOR) 40 MG tablet Take 20 mg by mouth at bedtime.    . Cholecalciferol (VITAMIN D3) 400 UNITS tablet Take 400 Units by mouth every morning.     . docusate sodium (COLACE) 100 MG capsule Take 1 capsule (100 mg total) by mouth 2 (two) times daily. 10 capsule 0  . mirabegron ER (MYRBETRIQ) 25 MG TB24 tablet Take 25 mg by mouth every morning.    . Multiple Vitamins-Minerals (MEGA  MULTIVITAMIN FOR MEN PO) Take 1 tablet by mouth every morning.     Marland Kitchen omega-3 acid ethyl esters (LOVAZA) 1 G capsule Take 1 g by mouth every morning.     . traMADol (ULTRAM) 50 MG tablet Take 1-2 tablets (50-100 mg total) by mouth every 6 (six) hours as needed for moderate pain or severe pain. 30 tablet 0   No current facility-administered medications for this visit.     REVIEW OF SYSTEMS:  [X]  denotes positive finding, [ ]  denotes negative finding Cardiac  Comments:  Chest pain or chest pressure:    Shortness of breath upon exertion:    Short of breath when lying flat:    Irregular heart rhythm:        Vascular    Pain in calf, thigh, or hip brought on by ambulation:     Pain in feet at night that wakes you up from your sleep:     Blood clot in your veins:    Leg swelling:         Pulmonary    Oxygen at home:    Productive cough:     Wheezing:         Neurologic    Sudden weakness in arms or legs:     Sudden numbness in arms or legs:     Sudden onset of difficulty speaking or slurred speech:    Temporary loss of vision in one eye:     Problems with dizziness:         Gastrointestinal    Blood in stool:     Vomited blood:         Genitourinary    Burning when urinating:     Blood in urine:        Psychiatric    Major depression:         Hematologic    Bleeding problems:    Problems with blood clotting too easily:        Skin    Rashes or ulcers:        Constitutional    Fever or chills:      PHYSICAL EXAM: Vitals:   02/10/17 1312  BP: 138/72  Pulse: 72  Resp: 20  Temp: 97.3 F (36.3 C)  TempSrc: Oral  SpO2: 93%  Weight: 202 lb (91.6 kg)  Height: 5' 8.5" (1.74 m)    GENERAL: The patient is a well-nourished male, in no acute distress. The vital signs are documented above. CARDIAC: There is a regular rate and rhythm.  VASCULAR: I do not detect carotid bruits. He has palpable femoral and dorsalis pedis pulses bilaterally. PULMONARY: There is good air exchange bilaterally without wheezing or rales. ABDOMEN: Soft and non-tender with normal pitched bowel sounds.  MUSCULOSKELETAL: There are no major deformities or cyanosis. NEUROLOGIC: No focal weakness or paresthesias are detected. SKIN: There are no ulcers or rashes noted. PSYCHIATRIC: The patient has a normal affect.  DATA:   ARTERIAL DOPPLER STUDY: He did have an arterial Doppler study on 02/02/2017 which showed triphasic Doppler signals in the right lower extremity with an ABI of 100%. There were also triphasic Doppler signals in the left lower extremity with an ABI of 100%.  I reviewed his labs which were done in October 2017 and showed a creatinine of 1.06 with a  GFR of 68.  MEDICAL ISSUES:  DIFFUSE AORTOILIAC OCCLUSIVE DISEASE: This patient has diffuse aortoiliac occlusive disease with markedly calcific plaque. He has normal pulses and ABIs at  rest but his symptoms occur with ambulation and I think this fits with his pattern of disease seen on his CT scan. I would recommend a conservative approach to this because he doesn't have any great options short of an axillobifemoral bypass graft. Given the severe calcific nature of the plaques he's not a good candidate for an endovascular approach. He is not a good candidate for aortofemoral bypass grafting given his age and multiple previous abdominal operations. I've encouraged him to continue to walk as much as possible. Fortunately he is not a smoker. If he ever did develop a limb threatening problem then he would be a candidate for an axillobifemoral bypass. However as his symptoms are tolerable, we will continue with conservative treatment. I'll be happy to see him back in anytime if his symptoms progress.   Deitra Mayo [facility] [phone]

## 2017-02-28 HISTORY — PX: MOHS SURGERY: SUR867

## 2017-11-09 ENCOUNTER — Other Ambulatory Visit: Payer: Self-pay | Admitting: Family Medicine

## 2017-11-09 DIAGNOSIS — R1032 Left lower quadrant pain: Secondary | ICD-10-CM

## 2017-11-10 ENCOUNTER — Ambulatory Visit
Admission: RE | Admit: 2017-11-10 | Discharge: 2017-11-10 | Disposition: A | Discharge status: Home / Self Care | Payer: Medicare Other | Source: Ambulatory Visit | Attending: Family Medicine | Admitting: Family Medicine

## 2017-11-10 DIAGNOSIS — Q631 Lobulated, fused and horseshoe kidney: Secondary | ICD-10-CM

## 2017-11-10 DIAGNOSIS — K4020 Bilateral inguinal hernia, without obstruction or gangrene, not specified as recurrent: Secondary | ICD-10-CM

## 2017-11-10 DIAGNOSIS — R1032 Left lower quadrant pain: Secondary | ICD-10-CM

## 2017-11-10 DIAGNOSIS — I700 Atherosclerosis of aorta: Secondary | ICD-10-CM

## 2017-11-10 HISTORY — DX: Lobulated, fused and horseshoe kidney: Q63.1

## 2017-11-10 HISTORY — DX: Atherosclerosis of aorta: I70.0

## 2017-11-10 HISTORY — DX: Bilateral inguinal hernia, without obstruction or gangrene, not specified as recurrent: K40.20

## 2017-11-10 MED ORDER — IOPAMIDOL (ISOVUE-300) INJECTION 61%
75.0000 mL | Freq: Once | INTRAVENOUS | Status: AC | PRN
Start: 2017-11-10 — End: 2017-11-10
  Administered 2017-11-10: 75 mL via INTRAVENOUS

## 2017-11-16 ENCOUNTER — Ambulatory Visit: Payer: Self-pay | Admitting: Surgery

## 2017-12-03 ENCOUNTER — Encounter (HOSPITAL_BASED_OUTPATIENT_CLINIC_OR_DEPARTMENT_OTHER): Payer: Self-pay

## 2017-12-03 ENCOUNTER — Other Ambulatory Visit: Payer: Self-pay

## 2017-12-03 NOTE — Progress Notes (Signed)
Spoke with:  Davine NPO:  After Midnight except Ensure at 4:30AM Arrival time:  530AM Labs: Istat4 AM medications:  Myrbetriq Pre op orders: Yes Ride home:  Vickii Chafe (wife) [phone]

## 2017-12-06 NOTE — Progress Notes (Addendum)
Patient came in to pick up preop Ensure drink.  One Ensure preop drink was given to patient with instructions NO SOLID FOOD AFTER MIDNIGHT THE NIGHT PRIOR TO SRRGERY. NOTHING BY MOUTH EXCEPT CLEAR LIQUIDS UNTIL 3 HOURS PRIOR TO [place] SURGERY. PLEASE FINISH ENSURE DRINK PER SURGEON ORDER 3 HOURS PRIOR TO SCHEDULED SURGERY TIME WHICH NEEDS TO BE COMPLETED AT ___0430 am_________. Patient voiced understanding.

## 2017-12-24 ENCOUNTER — Ambulatory Visit (HOSPITAL_BASED_OUTPATIENT_CLINIC_OR_DEPARTMENT_OTHER)
Admission: RE | Admit: 2017-12-24 | Discharge: 2017-12-24 | Disposition: A | Discharge status: Home / Self Care | Payer: Medicare Other | Source: Ambulatory Visit | Attending: Surgery | Admitting: Surgery

## 2017-12-24 ENCOUNTER — Ambulatory Visit (HOSPITAL_BASED_OUTPATIENT_CLINIC_OR_DEPARTMENT_OTHER): Payer: Medicare Other | Admitting: Anesthesiology

## 2017-12-24 ENCOUNTER — Encounter (HOSPITAL_BASED_OUTPATIENT_CLINIC_OR_DEPARTMENT_OTHER): Payer: Self-pay

## 2017-12-24 ENCOUNTER — Encounter (HOSPITAL_BASED_OUTPATIENT_CLINIC_OR_DEPARTMENT_OTHER)
Admission: RE | Disposition: A | Discharge status: Home / Self Care | Payer: Self-pay | Source: Ambulatory Visit | Attending: Surgery

## 2017-12-24 DIAGNOSIS — K4190 Unilateral femoral hernia, without obstruction or gangrene, not specified as recurrent: Secondary | ICD-10-CM | POA: Diagnosis not present

## 2017-12-24 DIAGNOSIS — Z7982 Long term (current) use of aspirin: Secondary | ICD-10-CM | POA: Insufficient documentation

## 2017-12-24 DIAGNOSIS — D176 Benign lipomatous neoplasm of spermatic cord: Secondary | ICD-10-CM | POA: Diagnosis not present

## 2017-12-24 DIAGNOSIS — K458 Other specified abdominal hernia without obstruction or gangrene: Secondary | ICD-10-CM

## 2017-12-24 DIAGNOSIS — K4020 Bilateral inguinal hernia, without obstruction or gangrene, not specified as recurrent: Secondary | ICD-10-CM | POA: Diagnosis present

## 2017-12-24 DIAGNOSIS — I081 Rheumatic disorders of both mitral and tricuspid valves: Secondary | ICD-10-CM | POA: Insufficient documentation

## 2017-12-24 DIAGNOSIS — E669 Obesity, unspecified: Secondary | ICD-10-CM

## 2017-12-24 DIAGNOSIS — Z87891 Personal history of nicotine dependence: Secondary | ICD-10-CM | POA: Insufficient documentation

## 2017-12-24 DIAGNOSIS — I10 Essential (primary) hypertension: Secondary | ICD-10-CM | POA: Diagnosis not present

## 2017-12-24 DIAGNOSIS — K660 Peritoneal adhesions (postprocedural) (postinfection): Secondary | ICD-10-CM | POA: Insufficient documentation

## 2017-12-24 DIAGNOSIS — Z79899 Other long term (current) drug therapy: Secondary | ICD-10-CM | POA: Diagnosis not present

## 2017-12-24 HISTORY — DX: Atherosclerosis of aorta: I70.0

## 2017-12-24 HISTORY — DX: Nonrheumatic aortic (valve) stenosis: I35.0

## 2017-12-24 HISTORY — DX: Incisional hernia without obstruction or gangrene: K43.2

## 2017-12-24 HISTORY — DX: Frequency of micturition: R35.0

## 2017-12-24 HISTORY — DX: Lobulated, fused and horseshoe kidney: Q63.1

## 2017-12-24 HISTORY — PX: INGUINAL HERNIA REPAIR: SHX194

## 2017-12-24 HISTORY — DX: Peripheral vascular disease, unspecified: I73.9

## 2017-12-24 HISTORY — DX: Unilateral femoral hernia, without obstruction or gangrene, not specified as recurrent: K41.90

## 2017-12-24 HISTORY — DX: Bilateral inguinal hernia, without obstruction or gangrene, not specified as recurrent: K40.20

## 2017-12-24 HISTORY — DX: Nonspecific intraventricular block: I45.4

## 2017-12-24 HISTORY — DX: Other specified abdominal hernia without obstruction or gangrene: K45.8

## 2017-12-24 HISTORY — DX: Benign neoplasm of cranial nerves: D33.3

## 2017-12-24 LAB — POCT I-STAT 4, (NA,K, GLUC, HGB,HCT)
Glucose, Bld: 126 mg/dL — ABNORMAL HIGH (ref 65–99)
HEMATOCRIT: 40 % (ref 39.0–52.0)
HEMOGLOBIN: 13.6 g/dL (ref 13.0–17.0)
POTASSIUM: 4.1 mmol/L (ref 3.5–5.1)
SODIUM: 141 mmol/L (ref 135–145)

## 2017-12-24 SURGERY — REPAIR, HERNIA, INGUINAL, BILATERAL, LAPAROSCOPIC
Anesthesia: General

## 2017-12-24 MED ORDER — HYDROMORPHONE HCL 1 MG/ML IJ SOLN
INTRAMUSCULAR | Status: AC
  Filled 2017-12-24: qty 1

## 2017-12-24 MED ORDER — LACTATED RINGERS IV SOLN
INTRAVENOUS | Status: DC
  Administered 2017-12-24 (×2): via INTRAVENOUS
  Filled 2017-12-24: qty 1000

## 2017-12-24 MED ORDER — HYDROMORPHONE HCL 1 MG/ML IJ SOLN
0.2500 mg | INTRAMUSCULAR | Status: DC | PRN
  Administered 2017-12-24 (×3): 0.25 mg via INTRAVENOUS
  Filled 2017-12-24: qty 0.5

## 2017-12-24 MED ORDER — TRAMADOL HCL 50 MG PO TABS
ORAL_TABLET | ORAL | Status: AC
  Filled 2017-12-24: qty 1

## 2017-12-24 MED ORDER — METHOCARBAMOL 750 MG PO TABS: 750.0000 mg | ORAL_TABLET | Freq: Four times a day (QID) | ORAL | 2 refills | Status: DC | PRN

## 2017-12-24 MED ORDER — ROCURONIUM BROMIDE 50 MG/5ML IV SOSY
PREFILLED_SYRINGE | INTRAVENOUS | Status: AC
  Filled 2017-12-24: qty 5

## 2017-12-24 MED ORDER — ACETAMINOPHEN 500 MG PO TABS
ORAL_TABLET | ORAL | Status: AC
  Filled 2017-12-24: qty 2

## 2017-12-24 MED ORDER — FENTANYL CITRATE (PF) 100 MCG/2ML IJ SOLN
INTRAMUSCULAR | Status: DC | PRN
  Administered 2017-12-24: 25 ug via INTRAVENOUS
  Administered 2017-12-24: 50 ug via INTRAVENOUS
  Administered 2017-12-24: 25 ug via INTRAVENOUS

## 2017-12-24 MED ORDER — SUGAMMADEX SODIUM 200 MG/2ML IV SOLN
INTRAVENOUS | Status: AC
  Filled 2017-12-24: qty 2

## 2017-12-24 MED ORDER — CHLORHEXIDINE GLUCONATE CLOTH 2 % EX PADS
6.0000 | MEDICATED_PAD | Freq: Once | CUTANEOUS | Status: DC
  Filled 2017-12-24: qty 6

## 2017-12-24 MED ORDER — DEXAMETHASONE SODIUM PHOSPHATE 10 MG/ML IJ SOLN
INTRAMUSCULAR | Status: DC | PRN
  Administered 2017-12-24: 10 mg via INTRAVENOUS

## 2017-12-24 MED ORDER — GABAPENTIN 300 MG PO CAPS
300.0000 mg | ORAL_CAPSULE | ORAL | Status: AC
  Administered 2017-12-24: 300 mg via ORAL
  Filled 2017-12-24: qty 1

## 2017-12-24 MED ORDER — TRAMADOL HCL 50 MG PO TABS: 50.0000 mg | ORAL_TABLET | Freq: Four times a day (QID) | ORAL | 0 refills | Status: DC | PRN

## 2017-12-24 MED ORDER — SODIUM CHLORIDE 0.9 % IR SOLN
Status: DC | PRN
  Administered 2017-12-24: 3000 mL

## 2017-12-24 MED ORDER — SUGAMMADEX SODIUM 200 MG/2ML IV SOLN
INTRAVENOUS | Status: DC | PRN
  Administered 2017-12-24: 180 mg via INTRAVENOUS

## 2017-12-24 MED ORDER — LIDOCAINE 2% (20 MG/ML) 5 ML SYRINGE
INTRAMUSCULAR | Status: DC | PRN
  Administered 2017-12-24: 40 mg via INTRAVENOUS

## 2017-12-24 MED ORDER — ONDANSETRON HCL 4 MG/2ML IJ SOLN
INTRAMUSCULAR | Status: AC
  Filled 2017-12-24: qty 2

## 2017-12-24 MED ORDER — EPHEDRINE SULFATE-NACL 50-0.9 MG/10ML-% IV SOSY
PREFILLED_SYRINGE | INTRAVENOUS | Status: DC | PRN
  Administered 2017-12-24 (×2): 10 mg via INTRAVENOUS

## 2017-12-24 MED ORDER — LIDOCAINE 2% (20 MG/ML) 5 ML SYRINGE
INTRAMUSCULAR | Status: AC
  Filled 2017-12-24: qty 10

## 2017-12-24 MED ORDER — GABAPENTIN 300 MG PO CAPS
ORAL_CAPSULE | ORAL | Status: AC
  Filled 2017-12-24: qty 1

## 2017-12-24 MED ORDER — ROCURONIUM BROMIDE 10 MG/ML (PF) SYRINGE
PREFILLED_SYRINGE | INTRAVENOUS | Status: DC | PRN
  Administered 2017-12-24: 20 mg via INTRAVENOUS
  Administered 2017-12-24: 10 mg via INTRAVENOUS
  Administered 2017-12-24: 50 mg via INTRAVENOUS
  Administered 2017-12-24: 10 mg via INTRAVENOUS
  Administered 2017-12-24: 20 mg via INTRAVENOUS

## 2017-12-24 MED ORDER — CEFAZOLIN SODIUM-DEXTROSE 2-4 GM/100ML-% IV SOLN
2.0000 g | INTRAVENOUS | Status: AC
  Administered 2017-12-24: 2 g via INTRAVENOUS
  Filled 2017-12-24: qty 100

## 2017-12-24 MED ORDER — PROMETHAZINE HCL 25 MG/ML IJ SOLN
6.2500 mg | INTRAMUSCULAR | Status: DC | PRN
  Filled 2017-12-24: qty 1

## 2017-12-24 MED ORDER — ACETAMINOPHEN 500 MG PO TABS
1000.0000 mg | ORAL_TABLET | ORAL | Status: AC
  Administered 2017-12-24: 1000 mg via ORAL
  Filled 2017-12-24: qty 2

## 2017-12-24 MED ORDER — FENTANYL CITRATE (PF) 100 MCG/2ML IJ SOLN
INTRAMUSCULAR | Status: AC
  Filled 2017-12-24: qty 2

## 2017-12-24 MED ORDER — MIDAZOLAM HCL 2 MG/2ML IJ SOLN
INTRAMUSCULAR | Status: AC
  Filled 2017-12-24: qty 2

## 2017-12-24 MED ORDER — LIDOCAINE 2% (20 MG/ML) 5 ML SYRINGE
INTRAMUSCULAR | Status: DC | PRN
  Administered 2017-12-24: 1 mg/kg/h via INTRAVENOUS

## 2017-12-24 MED ORDER — EPHEDRINE 5 MG/ML INJ
INTRAVENOUS | Status: AC
  Filled 2017-12-24: qty 10

## 2017-12-24 MED ORDER — BUPIVACAINE-EPINEPHRINE 0.25% -1:200000 IJ SOLN
INTRAMUSCULAR | Status: DC | PRN
Start: 2017-12-24 — End: 2017-12-24
  Administered 2017-12-24: 60 mL

## 2017-12-24 MED ORDER — PROPOFOL 10 MG/ML IV BOLUS
INTRAVENOUS | Status: DC | PRN
  Administered 2017-12-24: 150 mg via INTRAVENOUS

## 2017-12-24 MED ORDER — DEXAMETHASONE SODIUM PHOSPHATE 10 MG/ML IJ SOLN
INTRAMUSCULAR | Status: AC
  Filled 2017-12-24: qty 1

## 2017-12-24 MED ORDER — PROPOFOL 10 MG/ML IV BOLUS
INTRAVENOUS | Status: AC
  Filled 2017-12-24: qty 40

## 2017-12-24 MED ORDER — KETOROLAC TROMETHAMINE 30 MG/ML IJ SOLN
15.0000 mg | Freq: Once | INTRAMUSCULAR | Status: DC | PRN
  Filled 2017-12-24: qty 1

## 2017-12-24 MED ORDER — GLYCOPYRROLATE 0.2 MG/ML IV SOSY
PREFILLED_SYRINGE | INTRAVENOUS | Status: DC | PRN
  Administered 2017-12-24: .2 mg via INTRAVENOUS

## 2017-12-24 MED ORDER — CEFAZOLIN SODIUM-DEXTROSE 2-4 GM/100ML-% IV SOLN
INTRAVENOUS | Status: AC
  Filled 2017-12-24: qty 100

## 2017-12-24 MED ORDER — TRAMADOL HCL 50 MG PO TABS
50.0000 mg | ORAL_TABLET | Freq: Four times a day (QID) | ORAL | Status: DC | PRN
  Administered 2017-12-24: 50 mg via ORAL
  Filled 2017-12-24: qty 1

## 2017-12-24 MED ORDER — ONDANSETRON HCL 4 MG/2ML IJ SOLN
INTRAMUSCULAR | Status: DC | PRN
  Administered 2017-12-24: 4 mg via INTRAVENOUS

## 2017-12-24 MED ORDER — GLYCOPYRROLATE 0.2 MG/ML IV SOSY
PREFILLED_SYRINGE | INTRAVENOUS | Status: AC
  Filled 2017-12-24: qty 5

## 2017-12-24 MED ORDER — LIDOCAINE 2% (20 MG/ML) 5 ML SYRINGE
INTRAMUSCULAR | Status: AC
  Filled 2017-12-24: qty 5

## 2017-12-24 SURGICAL SUPPLY — 52 items
BAG URINE DRAINAGE (UROLOGICAL SUPPLIES) ×2 IMPLANT
BLADE SURG 11 STRL SS (BLADE) ×3 IMPLANT
CABLE HIGH FREQUENCY MONO STRZ (ELECTRODE) ×3 IMPLANT
CANISTER SUCT 3000ML PPV (MISCELLANEOUS) IMPLANT
CATH SILICONE 16FRX5CC (CATHETERS) ×2 IMPLANT
CHLORAPREP W/TINT 26ML (MISCELLANEOUS) ×3 IMPLANT
CLOSURE WOUND 1/2 X4 (GAUZE/BANDAGES/DRESSINGS) ×1
COVER BACK TABLE 60X90IN (DRAPES) ×3 IMPLANT
COVER MAYO STAND STRL (DRAPES) ×3 IMPLANT
DECANTER SPIKE VIAL GLASS SM (MISCELLANEOUS) ×3 IMPLANT
DEVICE SECURE STRAP 25 ABSORB (INSTRUMENTS) ×6 IMPLANT
DEVICE TROCAR PUNCTURE CLOSURE (ENDOMECHANICALS) ×2 IMPLANT
DRAPE LAPAROSCOPIC ABDOMINAL (DRAPES) ×3 IMPLANT
DRAPE UTILITY XL STRL (DRAPES) ×3 IMPLANT
DRAPE WARM FLUID 44X44 (DRAPE) ×3 IMPLANT
DRSG TEGADERM 2-3/8X2-3/4 SM (GAUZE/BANDAGES/DRESSINGS) ×5 IMPLANT
DRSG TEGADERM 4X4.75 (GAUZE/BANDAGES/DRESSINGS) ×3 IMPLANT
ELECT REM PT RETURN 9FT ADLT (ELECTROSURGICAL) ×3
ELECTRODE REM PT RTRN 9FT ADLT (ELECTROSURGICAL) ×1 IMPLANT
GLOVE ECLIPSE 8.0 STRL XLNG CF (GLOVE) ×3 IMPLANT
GLOVE INDICATOR 8.0 STRL GRN (GLOVE) ×3 IMPLANT
GOWN STRL REUS W/TWL XL LVL3 (GOWN DISPOSABLE) ×3 IMPLANT
IRRIG SUCT STRYKERFLOW 2 WTIP (MISCELLANEOUS) ×3
IRRIGATION SUCT STRKRFLW 2 WTP (MISCELLANEOUS) IMPLANT
KIT RM TURNOVER CYSTO AR (KITS) ×3 IMPLANT
MANIFOLD NEPTUNE II (INSTRUMENTS) IMPLANT
MESH HERNIA 6X6 BARD (Mesh General) IMPLANT
MESH HERNIA BARD 6X6 (Mesh General) ×6 IMPLANT
NEEDLE HYPO 22GX1.5 SAFETY (NEEDLE) ×3 IMPLANT
NS IRRIG 500ML POUR BTL (IV SOLUTION) ×3 IMPLANT
PACK BASIN DAY SURGERY FS (CUSTOM PROCEDURE TRAY) ×3 IMPLANT
PAD POSITIONING PINK XL (MISCELLANEOUS) ×3 IMPLANT
SCISSORS LAP 5X35 DISP (ENDOMECHANICALS) ×3 IMPLANT
SLEEVE ADV FIXATION 5X100MM (TROCAR) ×3 IMPLANT
SPONGE GAUZE 2X2 8PLY STER LF (GAUZE/BANDAGES/DRESSINGS) ×1
SPONGE GAUZE 2X2 8PLY STRL LF (GAUZE/BANDAGES/DRESSINGS) ×3 IMPLANT
STRIP CLOSURE SKIN 1/2X4 (GAUZE/BANDAGES/DRESSINGS) ×1 IMPLANT
SUT MNCRL AB 4-0 PS2 18 (SUTURE) ×3 IMPLANT
SUT PDS AB 1 CT1 27 (SUTURE) ×2 IMPLANT
SUT VIC AB 2-0 SH 27 (SUTURE) ×15
SUT VIC AB 2-0 SH 27X BRD (SUTURE) IMPLANT
SUT VIC AB 2-0 SH 27XBRD (SUTURE) IMPLANT
SUT VICRYL 0 UR6 27IN ABS (SUTURE) ×3 IMPLANT
SYR 10ML LL (SYRINGE) ×2 IMPLANT
SYR 20CC LL (SYRINGE) ×6 IMPLANT
TOWEL OR 17X24 6PK STRL BLUE (TOWEL DISPOSABLE) ×6 IMPLANT
TRAY DSU PREP LF (CUSTOM PROCEDURE TRAY) ×3 IMPLANT
TROCAR ADV FIXATION 5X100MM (TROCAR) ×3 IMPLANT
TROCAR BLADELESS OPT 5 100 (ENDOMECHANICALS) ×4 IMPLANT
TROCAR XCEL BLUNT TIP 100MML (ENDOMECHANICALS) ×3 IMPLANT
TUBING INSUF HEATED (TUBING) ×3 IMPLANT
WATER STERILE IRR 500ML POUR (IV SOLUTION) ×3 IMPLANT

## 2017-12-24 NOTE — H&P (Signed)
Patient Care Team: Matthew Austin, MD as PCP - General (Family Medicine) Matthew Brownie, MD (Dermatology) Matthew Clines, MD as Attending Physician (Urology) Matthew Boston, MD as Consulting Physician (General Surgery)    Matthew Hodges DOB: 1941-02-10 Married / Language: [name] / Race: White Male    Patient sent for surgical consultation at the request of Matthew Hodges  Chief Complaint: Left LLQ hernia  The patient is a pleasant patient's had numerous abdominal surgeries. Required HArtman resection 10 years ago. Ostomy TD.  Developed stitch abscess at his colostomy takedown site. Had those removed and got better. Developed hernia. Had open repair with mesh. Had recurrencee that required laparoscopic repair of mesh. Had bowel obstruction that required laparoscopic lysis of adhesions. He's been feeling fine for almost 2 years without any abdominal complaints.  However he started noticing some intermittent sharp pains in his left lower abdomen towards his groin. Worse when he bends over. He cannot tie his shoes. Some sharp pains near the end of the day or getting in and out of the car. Uncomfortable sitting long periods. Moving his bowels a few times in the morning. No problems with urination or defecation. No new infections. Becomes bothersome by the in the day. Or vomiting. No problems with infections. No more obstructions. Energy level activity level has been good. His wife is concerned he's had worsening symptoms. Because his abdominal pain, his primary care physician ordered a CAT scan. No evidence of recurrent diverticulitis. Concern for bilateral inguinal hernias. Surgical consultation requested.  (Review of systems as stated in this history (HPI) or in the review of systems. Otherwise all other 12 point ROS are negative)   Allergies (Matthew Hodges, CMA; 11/16/2017 10:05 AM) Latex Exam Gloves *MEDICAL DEVICES AND SUPPLIES*   Medication History (Matthew  Hodges, CMA; 11/16/2017 10:06 AM) MetroNIDAZOLE (500MG  Tablet, Oral) Active. Ciprofloxacin HCl (500MG  Tablet, Oral) Active. Vitamin D3 Complete (Oral Daily) Active. Myrbetriq (25MG  Tablet ER 24HR, Oral Daily) Active. Aspirin EC (81MG  Tablet DR, Oral) Active. Atorvastatin Calcium (10MG  Tablet, Oral) Active. Vitamin D (400UNIT Capsule, Oral) Active. Multivitamin/Fluoride (0.25MG  Tablet Chewable, Oral) Active. Lovaza (1GM Capsule, Oral) Active. Medications Reconciled  Vitals (Matthew Hodges CMA; 11/16/2017 10:07 AM) 11/16/2017 10:06 AM Weight: 207 lb Height: 68in Body Surface Area: 2.07 m Body Mass Index: 31.47 kg/m  Temp.: 9F(Oral)  Pulse: 91 (Regular)  P.OX: 98% (Room air) BP: 160/84 (Sitting, Left Arm, Standard)   BP (!) 152/73 (BP Location: Left Arm)   Pulse 81   Temp 97.7 F (36.5 C) (Oral)   Resp 18   Ht 5\' 8"  (1.727 m)   Wt 93.2 kg (205 lb 8 oz)   SpO2 98%   BMI 31.25 kg/m      Physical Exam [name] MD; 11/16/2017 10:41 AM) General Mental Status-Alert. General Appearance-Not in acute distress. Voice-Normal. Note: Relaxed. Nontoxic.   Integumentary Global Assessment Upon inspection and palpation of skin surfaces of the - Distribution of scalp and body hair is normal. General Characteristics Overall examination of the patient's skin reveals - no rashes and no suspicious lesions.  Head and Neck Head-normocephalic, atraumatic with no lesions or palpable masses. Face Global Assessment - atraumatic, no absence of expression. Neck Global Assessment - no abnormal movements, no decreased range of motion. Trachea-midline. Thyroid Gland Characteristics - non-tender.  Eye Eyeball - Left-Extraocular movements intact, No Nystagmus. Eyeball - Right-Extraocular movements intact, No Nystagmus. Upper Eyelid - Left-No Cyanotic. Upper Eyelid - Right-No Cyanotic.  Chest and Lung Exam Inspection Accessory muscles -  No use of accessory muscles in breathing.  Abdomen Note: Obese but soft. Some scalloping dimpling in the subcutaneous tissues with diastases but no definite recurrent abdominal wall hernia   Male Genitourinary Note: Sensitivity and left greater than right groins with bilateral inguinal hernias. Groins only. Normal external genitalia. Epididymi, testes, and spermatic cords normal without any masses.   Peripheral Vascular Upper Extremity Inspection - Left - Not Gangrenous, No Petechiae. Right - Not Gangrenous, No Petechiae.  Neurologic Neurologic evaluation reveals -normal attention span and ability to concentrate, able to name objects and repeat phrases. Appropriate fund of knowledge and normal coordination.  Neuropsychiatric Mental status exam performed with findings of-able to articulate well with normal speech/language, rate, volume and coherence and no evidence of hallucinations, delusions, obsessions or homicidal/suicidal ideation. Orientation-oriented X3.  Musculoskeletal Global Assessment Gait and Station - normal gait and station.  Lymphatic General Lymphatics Description - No Generalized lymphadenopathy.    Assessment & Plan  BILATERAL INGUINAL HERNIA WITHOUT OBSTRUCTION OR GANGRENE, RECURRENCE NOT SPECIFIED (K40.20) Impression: Left greater than right bilateral inguinal hernias in a patient with prior abdominal mesh repair and adhesions.  Standard care would be hernia repair with mesh. He is having increasing symptoms. Because it is bilateral, would be ideal to do laparoscopic underlay repair. However, risks of injury and adhesions are higher with this approach given his prior abdominal surgeries and adhesions. We'll start out with diagnostic laparoscopy. If no significant adhesions then TAPP approach. Otherwise low threshold to do more of an open approach. Would like to try and do underlay repair given bilateral hernias. He would prefer to try and do  laparoscopically if possible.   The anatomy & physiology of the abdominal wall and pelvic floor was discussed. The pathophysiology of hernias in the inguinal and pelvic region was discussed. Natural history risks such as progressive enlargement, pain, incarceration, and strangulation was discussed. Contributors to complications such as smoking, obesity, diabetes, prior surgery, etc were discussed.  I feel the risks of no intervention will lead to serious problems that outweigh the operative risks; therefore, I recommended surgery to reduce and repair the hernia. I explained laparoscopic techniques with possible need for an open approach. I noted usual use of mesh to patch and/or buttress hernia repair.  Increased risks of bowel/bladder injury with prior surgeies.  Risks such as bleeding, infection, abscess, need for further treatment, heart attack, death, and other risks were discussed. I noted a good likelihood this will help address the problem. Goals of post-operative recovery were discussed as well. Possibility that this will not correct all symptoms was explained. I stressed the importance of low-impact activity, aggressive pain control, avoiding constipation, & not pushing through pain to minimize risk of post-operative chronic pain or injury. Possibility of reherniation was discussed. We will work to minimize complications.  An educational handout further explaining the pathology & treatment options was given as well. Questions were answered. The patient expresses understanding & wishes to proceed with surgery.  Pt Education - Pamphlet Given - Laparoscopic Hernia Repair: discussed with patient and provided information. Pt Education - [facility] Pain Control (Maryclare Nydam) Pt Education - [facility] Hernia Post-Op HCI (Mell Guia): discussed with patient and provided information.     [name], M.D., F.A.C.S. Gastrointestinal and Minimally Invasive Surgery [facility] [address], [place], [place] [address] [phone] Main / Paging

## 2017-12-24 NOTE — Op Note (Signed)
12/24/2017  10:21 AM  PATIENT:  Matthew Hodges  77 y.o. male  Patient Care Team: Darcus Austin, MD as PCP - General (Family Medicine) Druscilla Brownie, MD (Dermatology) Carolan Clines, MD as Attending Physician (Urology) Michael Boston, MD as Consulting Physician (General Surgery)  PRE-OPERATIVE DIAGNOSIS:  BILATERAL INGUINAL HERNIA  POST-OPERATIVE DIAGNOSIS:    BILATERAL INGUINAL HERNIA LEFT FEMORAL HERNIA LEFT OBTURATOR HERNIA  PROCEDURE:    LAPAROSCOPIC  BILATERAL INGUINAL HERNIA REPAIR WITH MESH (TAPP) LAPAROSCOPIC left femoral hernia repair with mesh (TAPP) LAPAROSCOPIC left obturator hernia repair with mesh (TAPP) LAPAROSCOPIC LYSIS OF ADHESIONS X 60MIN   SURGEON:  [name], MD  ASSISTANT: None  ANESTHESIA:     Regional ilioinguinal and genitofemoral and spermatic cord nerve blocks  General  EBL:  Total I/O In: 600 [I.V.:600] Out: 130 [Urine:100; Blood:30].  See anesthesia record  Delay start of Pharmacological VTE agent (>24hrs) due to surgical blood loss or risk of bleeding:  no  DRAINS: NONE  SPECIMEN:  NONE  DISPOSITION OF SPECIMEN:  N/A  COUNTS:  YES  PLAN OF CARE: Discharge to home after PACU  PATIENT DISPOSITION:  PACU - hemodynamically stable.  INDICATION: Elderly gentleman with numerous incisional and other hernias requiring prior ventral hernia repairs with mesh.  Developed bilateral inguinal hernias.  Offered laparoscopic possible open exploration and repair of hernias found  The anatomy & physiology of the abdominal wall and pelvic floor was discussed.  The pathophysiology of hernias in the inguinal and pelvic region was discussed.  Natural history risks such as progressive enlargement, pain, incarceration & strangulation was discussed.   Contributors to complications such as smoking, obesity, diabetes, prior surgery, etc were discussed.    I feel the risks of no intervention will lead to serious problems that outweigh the  operative risks; therefore, I recommended surgery to reduce and repair the hernia.  I explained laparoscopic techniques with possible need for an open approach.  I noted usual use of mesh to patch and/or buttress hernia repair  Risks such as bleeding, infection, abscess, need for further treatment, heart attack, death, and other risks were discussed.  I noted a good likelihood this will help address the problem.   Goals of post-operative recovery were discussed as well.  Possibility that this will not correct all symptoms was explained.  I stressed the importance of low-impact activity, aggressive pain control, avoiding constipation, & not pushing through pain to minimize risk of post-operative chronic pain or injury. Possibility of reherniation was discussed.  We will work to minimize complications.     An educational handout further explaining the pathology & treatment options was given as well.  Questions were answered.  The patient expresses understanding & wishes to proceed with surgery.  OR FINDINGS: Right direct and indirect pantaloon type inguinal hernias.  On the left a direct space inguinal hernia, femoral hernia, obturator hernia.  Moderate adhesions to the anterior abdominal wall.  Some small bowel and greater omentum adherent to the central part of the abdomen.  Much less infraumbilically.    Supraumbilical adhesions left in situ.  DESCRIPTION:  The patient was identified & brought into the operating room. The patient was positioned supine with arms tucked. SCDs were active during the entire case. The patient underwent general anesthesia without any difficulty.  The abdomen was prepped and draped in a sterile fashion. The patient's bladder was emptied.  A Surgical Timeout confirmed our plan.  Peritoneal entry with a laparoscopic port was obtained using optical entry  technique in the right upper abdomen as the patient was positioned in reverse Trendelenburg.  Entry was clean.  I induced  carbon dioxide insufflation.  Camera inspection revealed no injury.  There were moderate central adhesions to the midline of numerous loops of small bowel and greater omentum.  Extra ports were carefully placed under direct laparoscopic visualization.  Carefully freed off adhesions infraumbilically.  Not very dense infraumbilically, so I decided to proceed with laparoscopic repair of the hernias: transabdominal (TAPP) approach.    I scored the peritoneum transversely from the right to left iliac crest infraumbilically, just inferior to the large central mesh used to repair former left lower quadrant parastomal and midline hernias..  I entered into the preperitoneal space and began to free the peritoneum off the anterior abdominal well and pelvis and retroperitoneum.  Freed the bladder off its anterior abdominal wall attachments and down to the pubic rim as well.  I focused attention on the LEFT pelvis since that was the dominant hernia side.   I used blunt & focused sharp dissection to free the peritoneum off the flank and down to the pubic rim.  I freed the anteriolateral bladder wall off the anteriolateral pelvic wall, sparing midline attachments.   I located a swath of peritoneum going into a hernia fascial defect at the  direct space consistent with  a direct space inguinal hernia..  I gradually freed the peritoneal hernia sac off safely and reduced it into the preperitoneal space.  I freed the peritoneum off the spermatic vessels & vas deferens.  I freed peritoneum off the retroperitoneum along the psoas muscle.  I reduced out obvious left femoral and obturator hernias.  Spermatic cord lipoma was dissected away & removed.  I checked & assured hemostasis.     I turned attention on the opposite  RIGHT pelvis.  I did dissection in a similar, mirror-image fashion. The patient had both a direct space and indirect inguinal hernias.Marland Kitchen   Spermatic cord lipoma was dissected away & removed.    I checked & assured  hemostasis.     Since he had thin poor materials with obesity and history of numerous hernias including recurrent ones, I chose 15x15 cm sheets of medium weight polypropylene mesh (Ultrapro), one for each side.  I cut a single sigmoid-shaped slit ~6cm from a corner of each mesh.  I placed the meshes into the preperitoneal space & laid them as overlapping diamonds such that at the inferior points, a 6x6 cm corner flap rested in the true anterolateral pelvis, covering the obturator & femoral foramina.   I allowed the bladder to return to the pubis, this helping tuck the corners of the mesh in the anteriolateral pelvis.  The medial corners overlapped each other across midline cephalad to the pubic rim.   Given the numerous hernias of moderate size, I placed a third 15x15cm mesh trimmed to a broad tip down pentagon in the center to provide coverage in the middle and good overlap between the numerous hernias especially the direct space ones that were bilateral. The lateral wings of the mesh overlap across the direct spaces and internal rings where the dominant hernias were.  This provided good coverage and reinforcement of the hernia repairs.    The began to re-tacked the peritoneum to the anterior abdominal wall.  It was somewhat thickened so I used 2-0 Vicryl suture transabdominal stitches to help go through the mesh and tacked the peritoneum to cover it along the superior edge interrupted x3.  That help bring it up together well.  I did use secure strap absorbable tackers to help tack the mesh in place and help retract some of the peritoneum on the superior lateral midline regions.  Given the fact that he had numerous prior surgeries with recurrences and adhesions, there were some pockets of peritoneal openings after retacking it up.  To avoid tacking into vessels and nerves in these regions left over, I used 2-0 Vicryl suture to close these peritoneal defects on the left side x2 in the right x1.  This eliminated  any mesh being exposed to the peritoneal cavity.  I did reinspection.  Hemostasis is good.  No evidence of bowel injury or other abnormalities.  I closed the right upper quadrant port site with #1 PDS interrupted suture.  I had upsized this port to allow mesh to come in and out and sutures to come in and out.  I evacuated carbon dioxide.  I closed the fascia with absorbable suture.  I closed the skin using 4-0 monocryl stitch.  Sterile dressings were applied.   The patient was extubated & arrived in the PACU in stable condition..  I had discussed postoperative care with the patient in the holding area.  Instructions are written in the chart.  I discussed operative findings, updated the patient's status, discussed probable steps to recovery, and gave postoperative recommendations to the patient's spouse.  Recommendations were made.  Questions were answered.  She expressed understanding & appreciation.   [name], M.D., F.A.C.S. Gastrointestinal and Minimally Invasive Surgery [facility] [address], [place], [place] [address] [phone] Main / Paging  12/24/2017 10:21 AM

## 2017-12-24 NOTE — Transfer of Care (Signed)
Immediate Anesthesia Transfer of Care Note  Patient: Matthew Hodges  Procedure(s) Performed: Procedure(s) (LRB): LAPAROSCOPIC  BILATERAL INGUINAL HERNIA REPAIR WITH MESH, LYSIS OF ADHESIONS (N/A)  Patient Location: PACU  Anesthesia Type: General  Level of Consciousness: awake, oriented, sedated and patient cooperative  Airway & Oxygen Therapy: Patient Spontanous Breathing and Patient connected to face mask oxygen  Post-op Assessment: Report given to PACU RN and Post -op Vital signs reviewed and stable  Post vital signs: Reviewed and stable  Complications: No apparent anesthesia complications  Last Vitals:  Vitals:   12/24/17 0553 12/24/17 1025  BP: (!) 152/73 (P) 139/67  Pulse: 81   Resp: 18 (P) 17  Temp: 36.5 C (P) 36.8 C  SpO2: 98% (P) 94%    Last Pain:  Vitals:   12/24/17 0553  TempSrc: Oral      Patients Stated Pain Goal: 3 (40/08/67 6195)  Complications: No Anethesia complications

## 2017-12-24 NOTE — Discharge Instructions (Signed)

## 2017-12-24 NOTE — Anesthesia Preprocedure Evaluation (Addendum)
Anesthesia Evaluation  Patient identified by MRN, date of birth, ID band Patient awake    Reviewed: Allergy & Precautions, NPO status , Patient's Chart, lab work & pertinent test results  Airway Mallampati: II  TM Distance: >3 FB Neck ROM: Full    Dental no notable dental hx.    Pulmonary neg pulmonary ROS, former smoker,    Pulmonary exam normal breath sounds clear to auscultation       Cardiovascular hypertension,  Rhythm:Regular Rate:Normal + Systolic murmurs Left ventricle: The cavity size was normal. Wall thickness was   increased in a pattern of mild LVH. Systolic function was normal.   The estimated ejection fraction was in the range of 60% to 65%.   Wall motion was normal; there were no regional wall motion   abnormalities. Doppler parameters are consistent with abnormal   left ventricular relaxation (grade 1 diastolic dysfunction). - Aortic valve: There was no stenosis. There was trivial   regurgitation. - Mitral valve: Mildly calcified annulus. There was trivial   regurgitation. - Right ventricle: The cavity size was normal. Systolic function   was normal. - Pulmonary arteries: No complete TR doppler jet so unable to   estimate PA systolic pressure. - Inferior vena cava: The vessel was normal in size. The   respirophasic diameter changes were in the normal range (>= 50%),   consistent with normal central venous pressure.  Impressions:  - Normal LV size with mild LV hypertrophy. EF 60-65%. Normal RV   size and systolic function. No significant valvular   abnormalities.   Neuro/Psych CVA negative psych ROS   GI/Hepatic negative GI ROS, Neg liver ROS,   Endo/Other  negative endocrine ROS  Renal/GU negative Renal ROS  negative genitourinary   Musculoskeletal negative musculoskeletal ROS (+)   Abdominal   Peds negative pediatric ROS (+)  Hematology negative hematology ROS (+)   Anesthesia Other  Findings   Reproductive/Obstetrics negative OB ROS                            Anesthesia Physical Anesthesia Plan  ASA: III  Anesthesia Plan: General   Post-op Pain Management:    Induction: Intravenous  PONV Risk Score and Plan: 2 and Ondansetron, Dexamethasone and Treatment may vary due to age or medical condition  Airway Management Planned: Oral ETT  Additional Equipment:   Intra-op Plan:   Post-operative Plan: Extubation in OR  Informed Consent: I have reviewed the patients History and Physical, chart, labs and discussed the procedure including the risks, benefits and alternatives for the proposed anesthesia with the patient or authorized representative who has indicated his/her understanding and acceptance.   Dental advisory given  Plan Discussed with: CRNA and Surgeon  Anesthesia Plan Comments:         Anesthesia Quick Evaluation

## 2017-12-24 NOTE — Anesthesia Procedure Notes (Signed)

## 2017-12-24 NOTE — Anesthesia Postprocedure Evaluation (Signed)
Anesthesia Post Note  Patient: Matthew Hodges  Procedure(s) Performed: LAPAROSCOPIC  BILATERAL INGUINAL HERNIA REPAIR WITH MESH, LYSIS OF ADHESIONS (N/A )     Patient location during evaluation: PACU Anesthesia Type: General Level of consciousness: awake and alert Pain management: pain level controlled Vital Signs Assessment: post-procedure vital signs reviewed and stable Respiratory status: spontaneous breathing, nonlabored ventilation, respiratory function stable and patient connected to nasal cannula oxygen Cardiovascular status: blood pressure returned to baseline and stable Postop Assessment: no apparent nausea or vomiting Anesthetic complications: no    Last Vitals:  Vitals:   12/24/17 1215 12/24/17 1315  BP: 135/73 138/67  Pulse: 83 87  Resp: 13 16  Temp:  (!) 36.3 C  SpO2: 92% 93%    Last Pain:  Vitals:   12/24/17 1315  TempSrc: Oral  PainSc: 3                  Gelena Klosinski S

## 2017-12-27 ENCOUNTER — Encounter (HOSPITAL_BASED_OUTPATIENT_CLINIC_OR_DEPARTMENT_OTHER): Payer: Self-pay | Admitting: Surgery

## 2018-11-01 ENCOUNTER — Other Ambulatory Visit: Payer: Self-pay | Admitting: Surgery

## 2018-11-01 DIAGNOSIS — K432 Incisional hernia without obstruction or gangrene: Secondary | ICD-10-CM

## 2018-11-05 ENCOUNTER — Ambulatory Visit
Admission: RE | Admit: 2018-11-05 | Discharge: 2018-11-05 | Disposition: A | Discharge status: Home / Self Care | Payer: Medicare Other | Source: Ambulatory Visit | Attending: Surgery | Admitting: Surgery

## 2018-11-05 DIAGNOSIS — K432 Incisional hernia without obstruction or gangrene: Secondary | ICD-10-CM

## 2018-11-05 MED ORDER — IOPAMIDOL (ISOVUE-300) INJECTION 61%
100.0000 mL | Freq: Once | INTRAVENOUS | Status: AC | PRN
  Administered 2018-11-05: 100 mL via INTRAVENOUS

## 2018-11-07 ENCOUNTER — Ambulatory Visit: Payer: Self-pay | Admitting: Surgery

## 2018-11-07 MED ORDER — DEXTROSE 5 % IV SOLN: 2.0000 g | INTRAVENOUS | Status: AC

## 2018-11-07 MED ORDER — BUPIVACAINE LIPOSOME 1.3 % IJ SUSP: 20.0000 mL | Freq: Once | INTRAMUSCULAR | Status: DC

## 2018-11-07 MED ORDER — GABAPENTIN 100 MG PO CAPS: 300.0000 mg | ORAL_CAPSULE | ORAL | Status: AC

## 2018-11-07 MED ORDER — ACETAMINOPHEN 500 MG PO TABS: 1000.0000 mg | ORAL_TABLET | ORAL | Status: AC

## 2018-12-27 NOTE — Patient Instructions (Addendum)
Your procedure is scheduled on: Thursday, Jan. 30, 2020   Surgery Time:  10AM-12:30PM   Report to [facility]    Report to admitting at 8:00 AM   Call this number if you have problems the morning of surgery [phone]   Do not eat food:After Midnight.   May have liquids until 7:00AM day of surgery   CLEAR LIQUID DIET  Foods Allowed                                                                     Foods Excluded  Water, Black Coffee and tea, regular and decaf                             liquids that you cannot  Plain Jell-O in any flavor                                             see through such as: Fruit ices (not with fruit pulp)                                     milk, soups, orange juice  Iced Popsicles                                    All solid food Carbonated beverages, regular and diet                                    Cranberry, grape and apple juices Sports drinks like Gatorade Lightly seasoned clear broth or consume(fat free) Sugar, honey syrup  Sample Menu Breakfast                                Lunch                                     Supper Cranberry juice                    Beef broth                            Chicken broth Jell-O                                     Grape juice                           Apple juice Coffee or tea  Jell-O                                      Popsicle                                                Coffee or tea                        Coffee or tea   Brush your teeth the morning of surgery.   Do NOT smoke after Midnight   Complete one Ensure drink the morning of surgery at 7:00AM the day of surgery.   Take these medicines the morning of surgery with A SIP OF WATER: Mybetriq                               You may not have any metal on your body including  jewelry, and body piercings             Do not wear lotions, powders, perfumes/cologne, or deodorant                           Men may shave face and neck.   Do not bring valuables to the hospital. [place].   Contacts, dentures or bridgework may not be worn into surgery.   Leave suitcase in the car. After surgery it may be brought to your room.    Special Instructions: Bring a copy of your healthcare power of attorney and living will documents         the day of surgery if you haven't scanned them in before.              Please read over the following fact sheets you were given:  [facility] - Preparing for Surgery Before surgery, you can play an important role.  Because skin is not sterile, your skin needs to be as free of germs as possible.  You can reduce the number of germs on your skin by washing with CHG (chlorahexidine gluconate) soap before surgery.  CHG is an antiseptic cleaner which kills germs and bonds with the skin to continue killing germs even after washing. Please DO NOT use if you have an allergy to CHG or antibacterial soaps.  If your skin becomes reddened/irritated stop using the CHG and inform your nurse when you arrive at Short Stay. Do not shave (including legs and underarms) for at least 48 hours prior to the first CHG shower.  You may shave your face/neck.  Please follow these instructions carefully:  1.  Shower with CHG Soap the night before surgery and the  morning of surgery.  2.  If you choose to wash your hair, wash your hair first as usual with your normal  shampoo.  3.  After you shampoo, rinse your hair and body thoroughly to remove the shampoo.                             4.  Use CHG  as you would any other liquid soap.  You can apply chg directly to the skin and wash.  Gently with a scrungie or clean washcloth.  5.  Apply the CHG Soap to your body ONLY FROM THE NECK DOWN.   Do   not use on face/ open                           Wound or open sores. Avoid contact with eyes, ears mouth and   genitals (private parts).                        Wash face,  Genitals (private parts) with your normal soap.             6.  Wash thoroughly, paying special attention to the area where your    surgery  will be performed.  7.  Thoroughly rinse your body with warm water from the neck down.  8.  DO NOT shower/wash with your normal soap after using and rinsing off the CHG Soap.                9.  Pat yourself dry with a clean towel.            10.  Wear clean pajamas.            11.  Place clean sheets on your bed the night of your first shower and do not  sleep with pets. Day of Surgery : Do not apply any lotions/deodorants the morning of surgery.  Please wear clean clothes to the hospital/surgery center.  FAILURE TO FOLLOW THESE INSTRUCTIONS MAY RESULT IN THE CANCELLATION OF YOUR SURGERY  PATIENT SIGNATURE_________________________________  NURSE SIGNATURE__________________________________  ________________________________________________________________________

## 2018-12-28 ENCOUNTER — Other Ambulatory Visit: Payer: Self-pay | Admitting: Surgery

## 2018-12-28 ENCOUNTER — Encounter (HOSPITAL_COMMUNITY): Payer: Self-pay

## 2018-12-28 ENCOUNTER — Encounter (HOSPITAL_COMMUNITY)
Admission: RE | Admit: 2018-12-28 | Discharge: 2018-12-28 | Disposition: A | Discharge status: Home / Self Care | Payer: Medicare Other | Source: Ambulatory Visit | Attending: Surgery | Admitting: Surgery

## 2018-12-28 ENCOUNTER — Ambulatory Visit: Payer: Self-pay | Admitting: Surgery

## 2018-12-28 ENCOUNTER — Other Ambulatory Visit: Payer: Self-pay

## 2018-12-28 DIAGNOSIS — I10 Essential (primary) hypertension: Secondary | ICD-10-CM | POA: Diagnosis not present

## 2018-12-28 DIAGNOSIS — E785 Hyperlipidemia, unspecified: Secondary | ICD-10-CM | POA: Diagnosis not present

## 2018-12-28 DIAGNOSIS — Z818 Family history of other mental and behavioral disorders: Secondary | ICD-10-CM | POA: Diagnosis not present

## 2018-12-28 DIAGNOSIS — Z6831 Body mass index (BMI) 31.0-31.9, adult: Secondary | ICD-10-CM | POA: Diagnosis not present

## 2018-12-28 DIAGNOSIS — K660 Peritoneal adhesions (postprocedural) (postinfection): Secondary | ICD-10-CM | POA: Diagnosis not present

## 2018-12-28 DIAGNOSIS — E669 Obesity, unspecified: Secondary | ICD-10-CM | POA: Diagnosis not present

## 2018-12-28 DIAGNOSIS — Z811 Family history of alcohol abuse and dependence: Secondary | ICD-10-CM | POA: Diagnosis not present

## 2018-12-28 DIAGNOSIS — Z7982 Long term (current) use of aspirin: Secondary | ICD-10-CM | POA: Diagnosis not present

## 2018-12-28 DIAGNOSIS — Z91040 Latex allergy status: Secondary | ICD-10-CM | POA: Diagnosis not present

## 2018-12-28 DIAGNOSIS — Z8546 Personal history of malignant neoplasm of prostate: Secondary | ICD-10-CM | POA: Diagnosis not present

## 2018-12-28 DIAGNOSIS — Z01812 Encounter for preprocedural laboratory examination: Secondary | ICD-10-CM | POA: Insufficient documentation

## 2018-12-28 DIAGNOSIS — Z79899 Other long term (current) drug therapy: Secondary | ICD-10-CM | POA: Diagnosis not present

## 2018-12-28 DIAGNOSIS — Z8673 Personal history of transient ischemic attack (TIA), and cerebral infarction without residual deficits: Secondary | ICD-10-CM | POA: Diagnosis not present

## 2018-12-28 DIAGNOSIS — K430 Incisional hernia with obstruction, without gangrene: Secondary | ICD-10-CM | POA: Diagnosis present

## 2018-12-28 DIAGNOSIS — Z9889 Other specified postprocedural states: Secondary | ICD-10-CM | POA: Diagnosis not present

## 2018-12-28 DIAGNOSIS — Z87891 Personal history of nicotine dependence: Secondary | ICD-10-CM | POA: Diagnosis not present

## 2018-12-28 DIAGNOSIS — Z85828 Personal history of other malignant neoplasm of skin: Secondary | ICD-10-CM | POA: Diagnosis not present

## 2018-12-28 HISTORY — DX: Presence of external hearing-aid: Z97.4

## 2018-12-28 HISTORY — DX: Presence of spectacles and contact lenses: Z97.3

## 2018-12-28 HISTORY — DX: Unspecified hearing loss, unspecified ear: H91.90

## 2018-12-28 HISTORY — DX: Presence of dental prosthetic device (complete) (partial): Z97.2

## 2018-12-28 HISTORY — DX: Benign cyst of testis: N44.2

## 2018-12-28 LAB — CBC
HCT: 41.4 % (ref 39.0–52.0)
Hemoglobin: 13.5 g/dL (ref 13.0–17.0)
MCH: 30.8 pg (ref 26.0–34.0)
MCHC: 32.6 g/dL (ref 30.0–36.0)
MCV: 94.5 fL (ref 80.0–100.0)
PLATELETS: 268 10*3/uL (ref 150–400)
RBC: 4.38 MIL/uL (ref 4.22–5.81)
RDW: 12.7 % (ref 11.5–15.5)
WBC: 7.7 10*3/uL (ref 4.0–10.5)
nRBC: 0 % (ref 0.0–0.2)

## 2018-12-28 NOTE — H&P (View-Only) (Signed)
Matthew Hodges Documented: 10/31/2018 8:45 AM Location: [facility] Patient #: [id number] DOB: 11-22-1941 Married / Language: [name] / Race: White Male   History of Present Illness [name] MD; 10/31/2018 2:31 PM) The patient is a 78 year old male who presents for a hernia surgery post-op. Note for "Hernia surgery post-op": ` ` ` The patient returns s/p numerous abdominal surgeries:  Hartmann resection colectomy/colostomy for perforated diverticulitis 2000 Colostomy takedown 2000 [place] midline incisional hernia repair with mesh 2001 Excision of stitch abscesses 2003-2007 Laparoscopic lysis of adhesions and repair of left lower quadrant colostomy site hernia 09/02/2007 Laparoscopic lysis adhesions and repair of periumbilical incisional hernias 04/23/2015. Laparoscopic lysis of adhesions for bowel obstruction 2017. Laparoscopic lysis adhesions and repair of bilateral inguinal, left femoral, left obturator hernias with mesh. 12/24/2017          The returns to clinic after surgery on the insistence of his wife. Had episode of sharp periumbilical pain that concerned him. Wife concern. Wished him come in. He's feels fine now. No nausea or vomiting. Usually moves his bowels several times in the morning. Takes a few bowel movements to clean out. Takes stool softeners at bedtime. Appetite okay. He remains obese but his weight has not changed markedly in the past year. Denies any fevers, chills, or sweats.  ` ` 12/24/2017  10:21 AM  PATIENT: Matthew Hodges 78 y.o. male  Patient Care Team: Darcus Austin, MD as PCP - General (Family Medicine) Druscilla Brownie, MD (Dermatology) Carolan Clines, MD as Attending Physician (Urology) Michael Boston, MD as Consulting Physician (General Surgery)  PRE-OPERATIVE DIAGNOSIS: BILATERAL INGUINAL HERNIA  POST-OPERATIVE DIAGNOSIS:   BILATERAL INGUINAL HERNIA LEFT FEMORAL HERNIA LEFT OBTURATOR  HERNIA  PROCEDURE:   LAPAROSCOPIC BILATERAL INGUINAL HERNIA REPAIR WITH MESH (TAPP) LAPAROSCOPIC left femoral hernia repair with mesh (TAPP) LAPAROSCOPIC left obturator hernia repair with mesh (TAPP) LAPAROSCOPIC LYSIS OF ADHESIONS X 60MIN  SURGEON: [name], MD  ASSISTANT: None  ANESTHESIA:   Regional ilioinguinal and genitofemoral and spermatic cord nerve blocks General  EBL: Total I/O In: 600 [I.V.:600] Out: 130 [Urine:100; Blood:30]. See anesthesia record  Delay start of Pharmacological VTE agent (>24hrs) due to surgical blood loss or risk of bleeding: no  DRAINS: NONE  SPECIMEN: NONE  DISPOSITION OF SPECIMEN: N/A  COUNTS: YES  PLAN OF CARE: Discharge to home after PACU  PATIENT DISPOSITION: PACU - hemodynamically stable.  INDICATION: Elderly gentleman with numerous incisional and other hernias requiring prior ventral hernia repairs with mesh. Developed bilateral inguinal hernias. Offered laparoscopic possible open exploration and repair of hernias found  The anatomy & physiology of the abdominal wall and pelvic floor was discussed. The pathophysiology of hernias in the inguinal and pelvic region was discussed. Natural history risks such as progressive enlargement, pain, incarceration & strangulation was discussed. Contributors to complications such as smoking, obesity, diabetes, prior surgery, etc were discussed.   I feel the risks of no intervention will lead to serious problems that outweigh the operative risks; therefore, I recommended surgery to reduce and repair the hernia. I explained laparoscopic techniques with possible need for an open approach. I noted usual use of mesh to patch and/or buttress hernia repair  Risks such as bleeding, infection, abscess, need for further treatment, heart attack, death, and other risks were discussed. I noted a good likelihood this will help address the problem. Goals of post-operative recovery were  discussed as well. Possibility that this will not correct all symptoms was explained. I stressed  the importance of low-impact activity, aggressive pain control, avoiding constipation, & not pushing through pain to minimize risk of post-operative chronic pain or injury. Possibility of reherniation was discussed. We will work to minimize complications.   An educational handout further explaining the pathology & treatment options was given as well. Questions were answered. The patient expresses understanding & wishes to proceed with surgery.  OR FINDINGS: Right direct and indirect pantaloon type inguinal hernias.  On the left a direct space inguinal hernia, femoral hernia, obturator hernia.  Moderate adhesions to the anterior abdominal wall. Some small bowel and greater omentum adherent to the central part of the abdomen. Much less infraumbilically. Supraumbilical adhesions left in situ.  DESCRIPTION:  The patient was identified & brought into the operating room. The patient was positioned supine with arms tucked. SCDs were active during the entire case. The patient underwent general anesthesia without any difficulty. The abdomen was prepped and draped in a sterile fashion. The patient's bladder was emptied. A Surgical Timeout confirmed our plan.  Peritoneal entry with a laparoscopic port was obtained using optical entry technique in the right upper abdomen as the patient was positioned in reverse Trendelenburg. Entry was clean. I induced carbon dioxide insufflation. Camera inspection revealed no injury. There were moderate central adhesions to the midline of numerous loops of small bowel and greater omentum. Extra ports were carefully placed under direct laparoscopic visualization. Carefully freed off adhesions infraumbilically. Not very dense infraumbilically, so I decided to proceed with laparoscopic repair of the hernias: transabdominal (TAPP) approach.   I scored the  peritoneum transversely from the right to left iliac crest infraumbilically, just inferior to the large central mesh used to repair former left lower quadrant parastomal and midline hernias.. I entered into the preperitoneal space and began to free the peritoneum off the anterior abdominal well and pelvis and retroperitoneum. Freed the bladder off its anterior abdominal wall attachments and down to the pubic rim as well. I focused attention on the LEFT pelvis since that was the dominant hernia side. I used blunt & focused sharp dissection to free the peritoneum off the flank and down to the pubic rim. I freed the anteriolateral bladder wall off the anteriolateral pelvic wall, sparing midline attachments. I located a swath of peritoneum going into a hernia fascial defect at the direct space consistent with a direct space inguinal hernia.. I gradually freed the peritoneal hernia sac off safely and reduced it into the preperitoneal space. I freed the peritoneum off the spermatic vessels & vas deferens. I freed peritoneum off the retroperitoneum along the psoas muscle. I reduced out obvious left femoral and obturator hernias. Spermatic cord lipoma was dissected away & removed. I checked & assured hemostasis.   I turned attention on the opposite RIGHT pelvis. I did dissection in a similar, mirror-image fashion. The patient had both a direct space and indirect inguinal hernias.Marland Kitchen Spermatic cord lipoma was dissected away & removed. I checked & assured hemostasis.   Since he had thin poor materials with obesity and history of numerous hernias including recurrent ones, I chose 15x15 cm sheets of medium weight polypropylene mesh (Ultrapro), one for each side. I cut a single sigmoid-shaped slit ~6cm from a corner of each mesh. I placed the meshes into the preperitoneal space & laid them as overlapping diamonds such that at the inferior points, a 6x6 cm corner flap rested in the true  anterolateral pelvis, covering the obturator & femoral foramina. I allowed the bladder to return to the pubis,  this helping tuck the corners of the mesh in the anteriolateral pelvis. The medial corners overlapped each other across midline cephalad to the pubic rim. Given the numerous hernias of moderate size, I placed a third 15x15cm mesh trimmed to a broad tip down pentagon in the center to provide coverage in the middle and good overlap between the numerous hernias especially the direct space ones that were bilateral. The lateral wings of the mesh overlap across the direct spaces and internal rings where the dominant hernias were. This provided good coverage and reinforcement of the hernia repairs.   The began to re-tacked the peritoneum to the anterior abdominal wall. It was somewhat thickened so I used 2-0 Vicryl suture transabdominal stitches to help go through the mesh and tacked the peritoneum to cover it along the superior edge interrupted x3. That help bring it up together well. I did use secure strap absorbable tackers to help tack the mesh in place and help retract some of the peritoneum on the superior lateral midline regions. Given the fact that he had numerous prior surgeries with recurrences and adhesions, there were some pockets of peritoneal openings after retacking it up. To avoid tacking into vessels and nerves in these regions left over, I used 2-0 Vicryl suture to close these peritoneal defects on the left side x2 in the right x1. This eliminated any mesh being exposed to the peritoneal cavity.  I did reinspection. Hemostasis is good. No evidence of bowel injury or other abnormalities. I closed the right upper quadrant port site with #1 PDS interrupted suture. I had upsized this port to allow mesh to come in and out and sutures to come in and out. I evacuated carbon dioxide. I closed the fascia with absorbable suture. I closed the skin using 4-0 monocryl stitch. Sterile  dressings were applied.  The patient was extubated & arrived in the PACU in stable condition.. I had discussed postoperative care with the patient in the holding area. Instructions are written in the chart. I discussed operative findings, updated the patient's status, discussed probable steps to recovery, and gave postoperative recommendations to the patient's spouse. Recommendations were made. Questions were answered. She expressed understanding & appreciation.   [name], M.D., F.A.C.S. Gastrointestinal and Minimally Invasive Surgery [facility] 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.9)  SMALL BOWEL OBSTRUCTION DUE TO ADHESIONS (K56.50)  F/U PRN - S/P LAPAROSCOPIC SURGERY (Z98.890)  BILATERAL INGUINAL HERNIA WITHOUT OBSTRUCTION OR GANGRENE, RECURRENCE NOT SPECIFIED (K40.20)  PREOP - ING HERNIA - ENCOUNTER FOR PREOPERATIVE EXAMINATION FOR GENERAL SURGICAL PROCEDURE (Z01.818)  UNILATERAL RECURRENT FEMORAL HERNIA WITHOUT OBSTRUCTION OR GANGRENE (K41.91)  OBTURATOR HERNIA (K45.8)  OBESITY (BMI 30.0-34.9) (E66.9)   Past Surgical History [name], MD; 10/31/2018 9:19 AM) Colon Removal - Partial  Resection of Stomach  COLECTOMY WITH END COLOSTOMY (44143) [01/1999]: Perf diverticulitis EXPLORATORY LAPAROTOMY (49000) [03/1999]: Exploratory laparotomy, drainage of abscesses, repair small bowel fistula. COLOSTOMY TAKEDOWN (40814) [08/1999]: Dr Excell Seltzer OPEN REPAIR, HERNIA, ABDOMINAL ([id number]) [02/2000]: Open repair of midline & LLQ hwernias with mesh. Dr Excell Seltzer LAPAROSCOPIC REPAIR, [place], REDUCIBLE [phone]) [2008]: PHYSICIAN: [name], MD DATE OF BIRTH: 1941-06-08  DATE OF PROCEDURE: 09/02/2007 DATE  OF DISCHARGE:   OPERATIVE REPORT  PRIMARY CARE PHYSICIAN: Conchita Paris, M.D.  SURGEON: Michael Boston,  M.D.  ASSISTANT: Edsel Petrin. Dalbert Batman, M.D., as well as Carron Curie, P.A. student.  PREOPERATIVE DIAGNOSIS: History of prior abdominal surgeries and ventral hernia with paracolostomy hernia, possible midline ventral hernia.  POSTOPERATIVE DIAGNOSES: 1. Left lower quadrant incarcerated ventral hernia from old colostomy  site, 9 x 14 cm in size. 2. Extensive abdominal wall adhesions to anterior abdominal wall. 3. No evidence of any significant ventral hernia.  PROCEDURE PERFORMED: 1. Laparoscopic lysis of adhesions x150 minutes (equals 75%). 2. Laparoscopic ventral hernia repair with 20 x 30 cm  Parietex/Seprafilm dual-sided mesh.  OPERATIVE FINDINGS: He had very dense adhesions onto his midline involving numerous loops of small intestine and omentum. He had numerous adhesions on his left lateral abdomen as well of omentum along with incarcerated hernia through his colostomy site about 9 x 14 cm in size with omentum and small intestine within it. He had no strong evidence of a midline incisional hernia with prior mesh intact. REMOVAL, SUTURE, WITH ANESTHESIA (95621) [2011]: Removal of stitch abscesses at old LLQ colostomy site and midline  Diagnostic Studies History [name], MD; 10/31/2018 9:19 AM) Colonoscopy  >10 years ago  Allergies (Tanisha A. Owens Shark, [place]; 10/31/2018 8:45 AM) Latex Exam Gloves *MEDICAL DEVICES AND SUPPLIES*  Allergies Reconciled   Medication History (Tanisha A. Owens Shark, [place]; 10/31/2018 8:46 AM) Vitamin D3 Complete (Oral Daily) Active. Myrbetriq (25MG  Tablet ER 24HR, Oral Daily) Active. Aspirin EC (81MG  Tablet DR, Oral) Active. Atorvastatin Calcium (10MG  Tablet, Oral) Active. Vitamin D (400UNIT Capsule, Oral) Active. Multivitamin/Fluoride (0.25MG  Tablet Chewable, Oral)  Active. Lovaza (1GM Capsule, Oral) Active. MetroNIDAZOLE (500MG  Tablet, Oral) Active. Ciprofloxacin HCl (500MG  Tablet, Oral) Active. Medications Reconciled  Social History [name], MD; 10/31/2018 9:19 AM) Alcohol use  Remotely quit alcohol use. Caffeine use  Coffee. No drug use  Tobacco use  Former smoker.  Family History [name], MD; 10/31/2018 9:19 AM) Alcohol Abuse  Brother, Father, Sister. Depression  Sister.  Other Problems [name], MD; 10/31/2018 9:19 AM) Alcohol Abuse  Bladder Problems  Cerebrovascular Accident  Hypercholesterolemia  Melanoma  Prostate Cancer   Vitals (Tanisha A. Brown RMA; 10/31/2018 8:45 AM) 10/31/2018 8:45 AM Weight: 203.2 lb Height: 68in Body Surface Area: 2.06 m Body Mass Index: 30.9 kg/m  Temp.: 98.50F  Pulse: 97 (Regular)  BP: 132/82 (Sitting, Left Arm, Standard)       Physical Exam [name] MD; 10/31/2018 2:32 PM) General Mental Status-Alert. General Appearance-Not in acute distress, Not Sickly. Orientation-Oriented X3. Hydration-Well hydrated. Voice-Normal. Note: Nontoxic. Not sickly.   Integumentary Global Assessment Upon inspection and palpation of skin surfaces of the - Axillae: non-tender, no inflammation or ulceration, no drainage. and Distribution of scalp and body hair is normal. General Characteristics Temperature - normal warmth is noted.  Head and Neck Head-normocephalic, atraumatic with no lesions or palpable masses. Face Global Assessment - atraumatic, no absence of expression. Neck Global Assessment - no abnormal movements, no bruit auscultated on the right, no bruit auscultated on the left, no decreased range of motion, non-tender. Trachea-midline. Thyroid Gland Characteristics - non-tender.  Eye Eyeball - Left-Extraocular movements intact, No Nystagmus. Eyeball - Right-Extraocular movements intact, No Nystagmus. Cornea -  Left-No Hazy. Cornea - Right-No Hazy. Sclera/Conjunctiva - Left-No scleral icterus, No Discharge. Sclera/Conjunctiva - Right-No scleral icterus, No Discharge. Pupil - Left-Direct reaction to light normal. Pupil - Right-Direct reaction to light normal. Note: Wears glasses. Vision corrected   ENMT Ears Pinna - Left - no drainage observed, no generalized tenderness observed.  Right - no drainage observed, no generalized tenderness observed. Nose and Sinuses External Inspection of the Nose - no destructive lesion observed. Inspection of the nares - Left - quiet respiration. Right - quiet respiration. Mouth and Throat Lips - Upper Lip - no fissures observed, no pallor noted. Lower Lip - no fissures observed, no pallor noted. Nasopharynx - no discharge present. Oral Cavity/Oropharynx - Tongue - no dryness observed. Oral Mucosa - no cyanosis observed. Hypopharynx - no evidence of airway distress observed.  Chest and Lung Exam Inspection Movements - Normal and Symmetrical. Accessory muscles - No use of accessory muscles in breathing. Palpation Palpation of the chest reveals - Non-tender. Auscultation Breath sounds - Normal and Clear.  Cardiovascular Auscultation Rhythm - Regular. Murmurs & Other Heart Sounds - Auscultation of the heart reveals - No Murmurs and No Systolic Clicks.  Abdomen Inspection Inspection of the abdomen reveals - No Visible peristalsis and No Abnormal pulsations. Umbilicus - No Bleeding, No Urine drainage. Palpation/Percussion Palpation and Percussion of the abdomen reveal - Soft, Non Tender, No Rebound tenderness, No Rigidity (guarding) and No Cutaneous hyperesthesia. Note: Abdomen obese with significant diastases recti but soft. Right paramedian supraumbilical swelling that reduces down to a smaller fascial defect suspicious for a recurrent hernia. Sensitive unreducible. No evidence of any lobe midline left parastomal or inguinal hernias. Soft.  Nontender. No guarding.   Male Genitourinary Sexual Maturity Tanner 5 - Adult hair pattern and Adult penile size and shape.  Peripheral Vascular Upper Extremity Inspection - Left - No Cyanotic nailbeds, Not Ischemic. Right - No Cyanotic nailbeds, Not Ischemic.  Neurologic Neurologic evaluation reveals -normal attention span and ability to concentrate, able to name objects and repeat phrases. Appropriate fund of knowledge , normal sensation and normal coordination. Mental Status Affect - not angry, not paranoid. Cranial Nerves-Normal Bilaterally. Gait-Normal.  Neuropsychiatric Mental status exam performed with findings of-able to articulate well with normal speech/language, rate, volume and coherence, thought content normal with ability to perform basic computations and apply abstract reasoning and no evidence of hallucinations, delusions, obsessions or homicidal/suicidal ideation.  Musculoskeletal Global Assessment Spine, Ribs and Pelvis - no instability, subluxation or laxity. Right Upper Extremity - no instability, subluxation or laxity.  Lymphatic Head & Neck  General Head & Neck Lymphatics: Bilateral - Description - No Localized lymphadenopathy. Axillary  General Axillary Region: Bilateral - Description - No Localized lymphadenopathy. Femoral & Inguinal  Generalized Femoral & Inguinal Lymphatics: Left - Description - No Localized lymphadenopathy. Right - Description - No Localized lymphadenopathy.    Assessment & Plan Lars Mage Spillers CMA; 10/31/2018 3:52 PM) RECURRENT VENTRAL INCISIONAL HERNIA (K43.2) Impression: Status post lap [place] repair with giant sheet of mesh for numerous Swiss cheese hernias in the setting of diastasis recti and hernia recurrence 2008, 2016.  Suspicious for hernia recurrence right paramedian region. Most likely this is the most rightward and superior aspect of the large sheet of mesh used to cover the periumbilical infraumbilical and  left lower quadrant colostomy hernias in the past.  CT scan confirms hernia   The anatomy & physiology of the abdominal wall was discussed.  The pathophysiology of hernias was discussed.  Natural history risks without surgery including progeressive enlargement, pain, incarceration, & strangulation was discussed.   Contributors to complications such as smoking, obesity, diabetes, prior surgery, etc were discussed.   I feel the risks of no intervention will lead to serious problems that outweigh the operative risks; therefore, I recommended surgery to reduce and repair the hernia.  I explained laparoscopic techniques with possible need for an open approach.  I noted the probable use of mesh to patch and/or buttress the hernia repair  Risks such as bleeding, infection, abscess, need for further treatment, injury to other organs, need for repair of tissues / organs, stroke, heart attack, death, and other risks were discussed.  I noted a good likelihood this will help address the problem.   Goals of post-operative recovery were discussed as well.  Possibility that this will not correct all symptoms was explained.  I stressed the importance of low-impact activity, aggressive pain control, avoiding constipation, & not pushing through pain to minimize risk of post-operative chronic pain or injury. Possibility of reherniation especially with smoking, obesity, diabetes, immunosuppression, and other health conditions was discussed.  We will work to minimize complications.     An educational handout further explaining the pathology & treatment options was given as well.  Questions were answered.  The patient expresses understanding & wishes to proceed with surgery.    Current Plans Follow Up - Call [facility] office after tests / studies doneto discuss further plans Pt Education - [facility] Pain Control (Demarko Zeimet) Pt Education - [facility] Good Bowel Health (Ashliegh Parekh) CT ABDOMEN PELVIS W CON (83291) (Pt needs CT A/P w/contrast to  eval. recurrent ventral incisional hernia/abd.pain.) OBESITY (BMI 30.0-34.9) (E66.9) Impression: Isidore and borderline obese but his weight actually has been stable for a year. He will try to intentionally lose some weight, but looks like he's been 190-200s for many many years. It would not change recommendations, but would be ideal in trying to avoid hernia recurrence.    Signed by [name], MD (11/01/2018 11:55 AM)

## 2018-12-28 NOTE — H&P (Signed)
Matthew Hodges Documented: 10/31/2018 8:45 AM Location: [facility] Patient #: [id number] DOB: 11-01-1941 Married / Language: [name] / Race: White Male   History of Present Illness [name] MD; 10/31/2018 2:31 PM) The patient is a 78 year old male who presents for a hernia surgery post-op. Note for "Hernia surgery post-op": ` ` ` The patient returns s/p numerous abdominal surgeries:  Hartmann resection colectomy/colostomy for perforated diverticulitis 2000 Colostomy takedown 2000 Onlay midline incisional hernia repair with mesh 2001 Excision of stitch abscesses 2003-2007 Laparoscopic lysis of adhesions and repair of left lower quadrant colostomy site hernia 09/02/2007 Laparoscopic lysis adhesions and repair of periumbilical incisional hernias 04/23/2015. Laparoscopic lysis of adhesions for bowel obstruction 2017. Laparoscopic lysis adhesions and repair of bilateral inguinal, left femoral, left obturator hernias with mesh. 12/24/2017          The returns to clinic after surgery on the insistence of his wife. Had episode of sharp periumbilical pain that concerned him. Wife concern. Wished him come in. He's feels fine now. No nausea or vomiting. Usually moves his bowels several times in the morning. Takes a few bowel movements to clean out. Takes stool softeners at bedtime. Appetite okay. He remains obese but his weight has not changed markedly in the past year. Denies any fevers, chills, or sweats.  ` ` 12/24/2017  10:21 AM  PATIENT: Matthew Hodges 78 y.o. male  Patient Care Team: Darcus Austin, MD as PCP - General (Family Medicine) Druscilla Brownie, MD (Dermatology) Carolan Clines, MD as Attending Physician (Urology) Michael Boston, MD as Consulting Physician (General Surgery)  PRE-OPERATIVE DIAGNOSIS: BILATERAL INGUINAL HERNIA  POST-OPERATIVE DIAGNOSIS:   BILATERAL INGUINAL HERNIA LEFT FEMORAL HERNIA LEFT OBTURATOR  HERNIA  PROCEDURE:   LAPAROSCOPIC BILATERAL INGUINAL HERNIA REPAIR WITH MESH (TAPP) LAPAROSCOPIC left femoral hernia repair with mesh (TAPP) LAPAROSCOPIC left obturator hernia repair with mesh (TAPP) LAPAROSCOPIC LYSIS OF ADHESIONS X 60MIN  SURGEON: [name], MD  ASSISTANT: None  ANESTHESIA:   Regional ilioinguinal and genitofemoral and spermatic cord nerve blocks General  EBL: Total I/O In: 600 [I.V.:600] Out: 130 [Urine:100; Blood:30]. See anesthesia record  Delay start of Pharmacological VTE agent (>24hrs) due to surgical blood loss or risk of bleeding: no  DRAINS: NONE  SPECIMEN: NONE  DISPOSITION OF SPECIMEN: N/A  COUNTS: YES  PLAN OF CARE: Discharge to home after PACU  PATIENT DISPOSITION: PACU - hemodynamically stable.  INDICATION: Elderly gentleman with numerous incisional and other hernias requiring prior ventral hernia repairs with mesh. Developed bilateral inguinal hernias. Offered laparoscopic possible open exploration and repair of hernias found  The anatomy & physiology of the abdominal wall and pelvic floor was discussed. The pathophysiology of hernias in the inguinal and pelvic region was discussed. Natural history risks such as progressive enlargement, pain, incarceration & strangulation was discussed. Contributors to complications such as smoking, obesity, diabetes, prior surgery, etc were discussed.   I feel the risks of no intervention will lead to serious problems that outweigh the operative risks; therefore, I recommended surgery to reduce and repair the hernia. I explained laparoscopic techniques with possible need for an open approach. I noted usual use of mesh to patch and/or buttress hernia repair  Risks such as bleeding, infection, abscess, need for further treatment, heart attack, death, and other risks were discussed. I noted a good likelihood this will help address the problem. Goals of post-operative recovery were  discussed as well. Possibility that this will not correct all symptoms was explained. I stressed  the importance of low-impact activity, aggressive pain control, avoiding constipation, & not pushing through pain to minimize risk of post-operative chronic pain or injury. Possibility of reherniation was discussed. We will work to minimize complications.   An educational handout further explaining the pathology & treatment options was given as well. Questions were answered. The patient expresses understanding & wishes to proceed with surgery.  OR FINDINGS: Right direct and indirect pantaloon type inguinal hernias.  On the left a direct space inguinal hernia, femoral hernia, obturator hernia.  Moderate adhesions to the anterior abdominal wall. Some small bowel and greater omentum adherent to the central part of the abdomen. Much less infraumbilically. Supraumbilical adhesions left in situ.  DESCRIPTION:  The patient was identified & brought into the operating room. The patient was positioned supine with arms tucked. SCDs were active during the entire case. The patient underwent general anesthesia without any difficulty. The abdomen was prepped and draped in a sterile fashion. The patient's bladder was emptied. A Surgical Timeout confirmed our plan.  Peritoneal entry with a laparoscopic port was obtained using optical entry technique in the right upper abdomen as the patient was positioned in reverse Trendelenburg. Entry was clean. I induced carbon dioxide insufflation. Camera inspection revealed no injury. There were moderate central adhesions to the midline of numerous loops of small bowel and greater omentum. Extra ports were carefully placed under direct laparoscopic visualization. Carefully freed off adhesions infraumbilically. Not very dense infraumbilically, so I decided to proceed with laparoscopic repair of the hernias: transabdominal (TAPP) approach.   I scored the  peritoneum transversely from the right to left iliac crest infraumbilically, just inferior to the large central mesh used to repair former left lower quadrant parastomal and midline hernias.. I entered into the preperitoneal space and began to free the peritoneum off the anterior abdominal well and pelvis and retroperitoneum. Freed the bladder off its anterior abdominal wall attachments and down to the pubic rim as well. I focused attention on the LEFT pelvis since that was the dominant hernia side. I used blunt & focused sharp dissection to free the peritoneum off the flank and down to the pubic rim. I freed the anteriolateral bladder wall off the anteriolateral pelvic wall, sparing midline attachments. I located a swath of peritoneum going into a hernia fascial defect at the direct space consistent with a direct space inguinal hernia.. I gradually freed the peritoneal hernia sac off safely and reduced it into the preperitoneal space. I freed the peritoneum off the spermatic vessels & vas deferens. I freed peritoneum off the retroperitoneum along the psoas muscle. I reduced out obvious left femoral and obturator hernias. Spermatic cord lipoma was dissected away & removed. I checked & assured hemostasis.   I turned attention on the opposite RIGHT pelvis. I did dissection in a similar, mirror-image fashion. The patient had both a direct space and indirect inguinal hernias.Marland Kitchen Spermatic cord lipoma was dissected away & removed. I checked & assured hemostasis.   Since he had thin poor materials with obesity and history of numerous hernias including recurrent ones, I chose 15x15 cm sheets of medium weight polypropylene mesh (Ultrapro), one for each side. I cut a single sigmoid-shaped slit ~6cm from a corner of each mesh. I placed the meshes into the preperitoneal space & laid them as overlapping diamonds such that at the inferior points, a 6x6 cm corner flap rested in the true  anterolateral pelvis, covering the obturator & femoral foramina. I allowed the bladder to return to the pubis,  this helping tuck the corners of the mesh in the anteriolateral pelvis. The medial corners overlapped each other across midline cephalad to the pubic rim. Given the numerous hernias of moderate size, I placed a third 15x15cm mesh trimmed to a broad tip down pentagon in the center to provide coverage in the middle and good overlap between the numerous hernias especially the direct space ones that were bilateral. The lateral wings of the mesh overlap across the direct spaces and internal rings where the dominant hernias were. This provided good coverage and reinforcement of the hernia repairs.   The began to re-tacked the peritoneum to the anterior abdominal wall. It was somewhat thickened so I used 2-0 Vicryl suture transabdominal stitches to help go through the mesh and tacked the peritoneum to cover it along the superior edge interrupted x3. That help bring it up together well. I did use secure strap absorbable tackers to help tack the mesh in place and help retract some of the peritoneum on the superior lateral midline regions. Given the fact that he had numerous prior surgeries with recurrences and adhesions, there were some pockets of peritoneal openings after retacking it up. To avoid tacking into vessels and nerves in these regions left over, I used 2-0 Vicryl suture to close these peritoneal defects on the left side x2 in the right x1. This eliminated any mesh being exposed to the peritoneal cavity.  I did reinspection. Hemostasis is good. No evidence of bowel injury or other abnormalities. I closed the right upper quadrant port site with #1 PDS interrupted suture. I had upsized this port to allow mesh to come in and out and sutures to come in and out. I evacuated carbon dioxide. I closed the fascia with absorbable suture. I closed the skin using 4-0 monocryl stitch. Sterile  dressings were applied.  The patient was extubated & arrived in the PACU in stable condition.. I had discussed postoperative care with the patient in the holding area. Instructions are written in the chart. I discussed operative findings, updated the patient's status, discussed probable steps to recovery, and gave postoperative recommendations to the patient's spouse. Recommendations were made. Questions were answered. She expressed understanding & appreciation.   [name], M.D., F.A.C.S. Gastrointestinal and Minimally Invasive Surgery [facility] 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.9)  SMALL BOWEL OBSTRUCTION DUE TO ADHESIONS (K56.50)  F/U PRN - S/P LAPAROSCOPIC SURGERY (Z98.890)  BILATERAL INGUINAL HERNIA WITHOUT OBSTRUCTION OR GANGRENE, RECURRENCE NOT SPECIFIED (K40.20)  PREOP - ING HERNIA - ENCOUNTER FOR PREOPERATIVE EXAMINATION FOR GENERAL SURGICAL PROCEDURE (Z01.818)  UNILATERAL RECURRENT FEMORAL HERNIA WITHOUT OBSTRUCTION OR GANGRENE (K41.91)  OBTURATOR HERNIA (K45.8)  OBESITY (BMI 30.0-34.9) (E66.9)   Past Surgical History [name], MD; 10/31/2018 9:19 AM) Colon Removal - Partial  Resection of Stomach  COLECTOMY WITH END COLOSTOMY (44143) [01/1999]: Perf diverticulitis EXPLORATORY LAPAROTOMY (49000) [03/1999]: Exploratory laparotomy, drainage of abscesses, repair small bowel fistula. COLOSTOMY TAKEDOWN (46270) [08/1999]: Dr Excell Seltzer OPEN REPAIR, HERNIA, ABDOMINAL (35009) [02/2000]: Open repair of midline & LLQ hwernias with mesh. Dr Excell Seltzer LAPAROSCOPIC REPAIR, [place], REDUCIBLE [phone]) [2008]: PHYSICIAN: [name], MD DATE OF BIRTH: 1941/04/20  DATE OF PROCEDURE: 09/02/2007 DATE  OF DISCHARGE:   OPERATIVE REPORT  PRIMARY CARE PHYSICIAN: Conchita Paris, M.D.  SURGEON: Michael Boston,  M.D.  ASSISTANT: Edsel Petrin. Dalbert Batman, M.D., as well as Carron Curie, P.A. student.  PREOPERATIVE DIAGNOSIS: History of prior abdominal surgeries and ventral hernia with paracolostomy hernia, possible midline ventral hernia.  POSTOPERATIVE DIAGNOSES: 1. Left lower quadrant incarcerated ventral hernia from old colostomy  site, 9 x 14 cm in size. 2. Extensive abdominal wall adhesions to anterior abdominal wall. 3. No evidence of any significant ventral hernia.  PROCEDURE PERFORMED: 1. Laparoscopic lysis of adhesions x150 minutes (equals 75%). 2. Laparoscopic ventral hernia repair with 20 x 30 cm  Parietex/Seprafilm dual-sided mesh.  OPERATIVE FINDINGS: He had very dense adhesions onto his midline involving numerous loops of small intestine and omentum. He had numerous adhesions on his left lateral abdomen as well of omentum along with incarcerated hernia through his colostomy site about 9 x 14 cm in size with omentum and small intestine within it. He had no strong evidence of a midline incisional hernia with prior mesh intact. REMOVAL, SUTURE, WITH ANESTHESIA (29518) [2011]: Removal of stitch abscesses at old LLQ colostomy site and midline  Diagnostic Studies History [name], MD; 10/31/2018 9:19 AM) Colonoscopy  >10 years ago  Allergies (Tanisha A. Owens Shark, [place]; 10/31/2018 8:45 AM) Latex Exam Gloves *MEDICAL DEVICES AND SUPPLIES*  Allergies Reconciled   Medication History (Tanisha A. Owens Shark, [place]; 10/31/2018 8:46 AM) Vitamin D3 Complete (Oral Daily) Active. Myrbetriq (25MG  Tablet ER 24HR, Oral Daily) Active. Aspirin EC (81MG  Tablet DR, Oral) Active. Atorvastatin Calcium (10MG  Tablet, Oral) Active. Vitamin D (400UNIT Capsule, Oral) Active. Multivitamin/Fluoride (0.25MG  Tablet Chewable, Oral)  Active. Lovaza (1GM Capsule, Oral) Active. MetroNIDAZOLE (500MG  Tablet, Oral) Active. Ciprofloxacin HCl (500MG  Tablet, Oral) Active. Medications Reconciled  Social History [name], MD; 10/31/2018 9:19 AM) Alcohol use  Remotely quit alcohol use. Caffeine use  Coffee. No drug use  Tobacco use  Former smoker.  Family History [name], MD; 10/31/2018 9:19 AM) Alcohol Abuse  Brother, Father, Sister. Depression  Sister.  Other Problems [name], MD; 10/31/2018 9:19 AM) Alcohol Abuse  Bladder Problems  Cerebrovascular Accident  Hypercholesterolemia  Melanoma  Prostate Cancer   Vitals (Tanisha A. Brown RMA; 10/31/2018 8:45 AM) 10/31/2018 8:45 AM Weight: 203.2 lb Height: 68in Body Surface Area: 2.06 m Body Mass Index: 30.9 kg/m  Temp.: 98.38F  Pulse: 97 (Regular)  BP: 132/82 (Sitting, Left Arm, Standard)       Physical Exam [name] MD; 10/31/2018 2:32 PM) General Mental Status-Alert. General Appearance-Not in acute distress, Not Sickly. Orientation-Oriented X3. Hydration-Well hydrated. Voice-Normal. Note: Nontoxic. Not sickly.   Integumentary Global Assessment Upon inspection and palpation of skin surfaces of the - Axillae: non-tender, no inflammation or ulceration, no drainage. and Distribution of scalp and body hair is normal. General Characteristics Temperature - normal warmth is noted.  Head and Neck Head-normocephalic, atraumatic with no lesions or palpable masses. Face Global Assessment - atraumatic, no absence of expression. Neck Global Assessment - no abnormal movements, no bruit auscultated on the right, no bruit auscultated on the left, no decreased range of motion, non-tender. Trachea-midline. Thyroid Gland Characteristics - non-tender.  Eye Eyeball - Left-Extraocular movements intact, No Nystagmus. Eyeball - Right-Extraocular movements intact, No Nystagmus. Cornea -  Left-No Hazy. Cornea - Right-No Hazy. Sclera/Conjunctiva - Left-No scleral icterus, No Discharge. Sclera/Conjunctiva - Right-No scleral icterus, No Discharge. Pupil - Left-Direct reaction to light normal. Pupil - Right-Direct reaction to light normal. Note: Wears glasses. Vision corrected   ENMT Ears Pinna - Left - no drainage observed, no generalized tenderness observed.  Right - no drainage observed, no generalized tenderness observed. Nose and Sinuses External Inspection of the Nose - no destructive lesion observed. Inspection of the nares - Left - quiet respiration. Right - quiet respiration. Mouth and Throat Lips - Upper Lip - no fissures observed, no pallor noted. Lower Lip - no fissures observed, no pallor noted. Nasopharynx - no discharge present. Oral Cavity/Oropharynx - Tongue - no dryness observed. Oral Mucosa - no cyanosis observed. Hypopharynx - no evidence of airway distress observed.  Chest and Lung Exam Inspection Movements - Normal and Symmetrical. Accessory muscles - No use of accessory muscles in breathing. Palpation Palpation of the chest reveals - Non-tender. Auscultation Breath sounds - Normal and Clear.  Cardiovascular Auscultation Rhythm - Regular. Murmurs & Other Heart Sounds - Auscultation of the heart reveals - No Murmurs and No Systolic Clicks.  Abdomen Inspection Inspection of the abdomen reveals - No Visible peristalsis and No Abnormal pulsations. Umbilicus - No Bleeding, No Urine drainage. Palpation/Percussion Palpation and Percussion of the abdomen reveal - Soft, Non Tender, No Rebound tenderness, No Rigidity (guarding) and No Cutaneous hyperesthesia. Note: Abdomen obese with significant diastases recti but soft. Right paramedian supraumbilical swelling that reduces down to a smaller fascial defect suspicious for a recurrent hernia. Sensitive unreducible. No evidence of any lobe midline left parastomal or inguinal hernias. Soft.  Nontender. No guarding.   Male Genitourinary Sexual Maturity Tanner 5 - Adult hair pattern and Adult penile size and shape.  Peripheral Vascular Upper Extremity Inspection - Left - No Cyanotic nailbeds, Not Ischemic. Right - No Cyanotic nailbeds, Not Ischemic.  Neurologic Neurologic evaluation reveals -normal attention span and ability to concentrate, able to name objects and repeat phrases. Appropriate fund of knowledge , normal sensation and normal coordination. Mental Status Affect - not angry, not paranoid. Cranial Nerves-Normal Bilaterally. Gait-Normal.  Neuropsychiatric Mental status exam performed with findings of-able to articulate well with normal speech/language, rate, volume and coherence, thought content normal with ability to perform basic computations and apply abstract reasoning and no evidence of hallucinations, delusions, obsessions or homicidal/suicidal ideation.  Musculoskeletal Global Assessment Spine, Ribs and Pelvis - no instability, subluxation or laxity. Right Upper Extremity - no instability, subluxation or laxity.  Lymphatic Head & Neck  General Head & Neck Lymphatics: Bilateral - Description - No Localized lymphadenopathy. Axillary  General Axillary Region: Bilateral - Description - No Localized lymphadenopathy. Femoral & Inguinal  Generalized Femoral & Inguinal Lymphatics: Left - Description - No Localized lymphadenopathy. Right - Description - No Localized lymphadenopathy.    Assessment & Plan Lars Mage Spillers CMA; 10/31/2018 3:52 PM) RECURRENT VENTRAL INCISIONAL HERNIA (K43.2) Impression: Status post lap Berthoud repair with giant sheet of mesh for numerous Swiss cheese hernias in the setting of diastasis recti and hernia recurrence 2008, 2016.  Suspicious for hernia recurrence right paramedian region. Most likely this is the most rightward and superior aspect of the large sheet of mesh used to cover the periumbilical infraumbilical and  left lower quadrant colostomy hernias in the past.  CT scan confirms hernia   The anatomy & physiology of the abdominal wall was discussed.  The pathophysiology of hernias was discussed.  Natural history risks without surgery including progeressive enlargement, pain, incarceration, & strangulation was discussed.   Contributors to complications such as smoking, obesity, diabetes, prior surgery, etc were discussed.   I feel the risks of no intervention will lead to serious problems that outweigh the operative risks; therefore, I recommended surgery to reduce and repair the hernia.  I explained laparoscopic techniques with possible need for an open approach.  I noted the probable use of mesh to patch and/or buttress the hernia repair  Risks such as bleeding, infection, abscess, need for further treatment, injury to other organs, need for repair of tissues / organs, stroke, heart attack, death, and other risks were discussed.  I noted a good likelihood this will help address the problem.   Goals of post-operative recovery were discussed as well.  Possibility that this will not correct all symptoms was explained.  I stressed the importance of low-impact activity, aggressive pain control, avoiding constipation, & not pushing through pain to minimize risk of post-operative chronic pain or injury. Possibility of reherniation especially with smoking, obesity, diabetes, immunosuppression, and other health conditions was discussed.  We will work to minimize complications.     An educational handout further explaining the pathology & treatment options was given as well.  Questions were answered.  The patient expresses understanding & wishes to proceed with surgery.    Current Plans Follow Up - Call [facility] office after tests / studies doneto discuss further plans Pt Education - [facility] Pain Control ([place]) Pt Education - [facility] Good Bowel Health ([place]) CT ABDOMEN PELVIS W CON (16010) (Pt needs CT A/P w/contrast to  eval. recurrent ventral incisional hernia/abd.pain.) OBESITY (BMI 30.0-34.9) (E66.9) Impression: [place] and borderline obese but his weight actually has been stable for a year. He will try to intentionally lose some weight, but looks like he's been 190-200s for many many years. It would not change recommendations, but would be ideal in trying to avoid hernia recurrence.    Signed by [name], MD (11/01/2018 11:55 AM)

## 2018-12-29 ENCOUNTER — Ambulatory Visit (HOSPITAL_COMMUNITY): Payer: Medicare Other | Admitting: Physician Assistant

## 2018-12-29 ENCOUNTER — Encounter (HOSPITAL_COMMUNITY): Payer: Self-pay | Admitting: Emergency Medicine

## 2018-12-29 ENCOUNTER — Observation Stay (HOSPITAL_COMMUNITY)
Admission: RE | Admit: 2018-12-29 | Discharge: 2018-12-30 | Disposition: A | Discharge status: Home / Self Care | Payer: Medicare Other | Attending: Surgery | Admitting: Surgery

## 2018-12-29 ENCOUNTER — Encounter (HOSPITAL_COMMUNITY)
Admission: RE | Disposition: A | Discharge status: Home / Self Care | Payer: Self-pay | Source: Home / Self Care | Attending: Surgery

## 2018-12-29 ENCOUNTER — Ambulatory Visit (HOSPITAL_COMMUNITY): Payer: Medicare Other | Admitting: Anesthesiology

## 2018-12-29 DIAGNOSIS — K430 Incisional hernia with obstruction, without gangrene: Principal | ICD-10-CM | POA: Insufficient documentation

## 2018-12-29 DIAGNOSIS — K56609 Unspecified intestinal obstruction, unspecified as to partial versus complete obstruction: Secondary | ICD-10-CM

## 2018-12-29 DIAGNOSIS — Z8673 Personal history of transient ischemic attack (TIA), and cerebral infarction without residual deficits: Secondary | ICD-10-CM | POA: Insufficient documentation

## 2018-12-29 DIAGNOSIS — Z91040 Latex allergy status: Secondary | ICD-10-CM | POA: Insufficient documentation

## 2018-12-29 DIAGNOSIS — Z9889 Other specified postprocedural states: Secondary | ICD-10-CM | POA: Insufficient documentation

## 2018-12-29 DIAGNOSIS — E785 Hyperlipidemia, unspecified: Secondary | ICD-10-CM | POA: Diagnosis not present

## 2018-12-29 DIAGNOSIS — I10 Essential (primary) hypertension: Secondary | ICD-10-CM | POA: Insufficient documentation

## 2018-12-29 DIAGNOSIS — Z79899 Other long term (current) drug therapy: Secondary | ICD-10-CM | POA: Insufficient documentation

## 2018-12-29 DIAGNOSIS — Z85828 Personal history of other malignant neoplasm of skin: Secondary | ICD-10-CM | POA: Insufficient documentation

## 2018-12-29 DIAGNOSIS — Z6831 Body mass index (BMI) 31.0-31.9, adult: Secondary | ICD-10-CM | POA: Insufficient documentation

## 2018-12-29 DIAGNOSIS — Z7982 Long term (current) use of aspirin: Secondary | ICD-10-CM | POA: Insufficient documentation

## 2018-12-29 DIAGNOSIS — E669 Obesity, unspecified: Secondary | ICD-10-CM | POA: Insufficient documentation

## 2018-12-29 DIAGNOSIS — K660 Peritoneal adhesions (postprocedural) (postinfection): Secondary | ICD-10-CM | POA: Diagnosis not present

## 2018-12-29 DIAGNOSIS — Z818 Family history of other mental and behavioral disorders: Secondary | ICD-10-CM | POA: Insufficient documentation

## 2018-12-29 DIAGNOSIS — Z811 Family history of alcohol abuse and dependence: Secondary | ICD-10-CM | POA: Insufficient documentation

## 2018-12-29 DIAGNOSIS — Z8546 Personal history of malignant neoplasm of prostate: Secondary | ICD-10-CM | POA: Insufficient documentation

## 2018-12-29 DIAGNOSIS — Z87891 Personal history of nicotine dependence: Secondary | ICD-10-CM | POA: Insufficient documentation

## 2018-12-29 HISTORY — DX: Bilateral inguinal hernia, without obstruction or gangrene, not specified as recurrent: K40.20

## 2018-12-29 HISTORY — DX: Unilateral femoral hernia, without obstruction or gangrene, not specified as recurrent: K41.90

## 2018-12-29 HISTORY — PX: VENTRAL HERNIA REPAIR: SHX424

## 2018-12-29 HISTORY — DX: Other specified abdominal hernia without obstruction or gangrene: K45.8

## 2018-12-29 SURGERY — REPAIR, HERNIA, VENTRAL, LAPAROSCOPIC
Anesthesia: General | Site: Abdomen

## 2018-12-29 MED ORDER — SODIUM CHLORIDE 0.9% FLUSH: 3.0000 mL | INTRAVENOUS | Status: DC | PRN

## 2018-12-29 MED ORDER — ACETAMINOPHEN 500 MG PO TABS
1000.0000 mg | ORAL_TABLET | Freq: Three times a day (TID) | ORAL | Status: DC
  Administered 2018-12-29: 1000 mg via ORAL
  Filled 2018-12-29 (×2): qty 2

## 2018-12-29 MED ORDER — FENTANYL CITRATE (PF) 100 MCG/2ML IJ SOLN
INTRAMUSCULAR | Status: AC
  Filled 2018-12-29: qty 2

## 2018-12-29 MED ORDER — ALUM & MAG HYDROXIDE-SIMETH 200-200-20 MG/5ML PO SUSP: 30.0000 mL | Freq: Four times a day (QID) | ORAL | Status: DC | PRN

## 2018-12-29 MED ORDER — SUGAMMADEX SODIUM 200 MG/2ML IV SOLN
INTRAVENOUS | Status: DC | PRN
  Administered 2018-12-29: 200 mg via INTRAVENOUS

## 2018-12-29 MED ORDER — MENTHOL 3 MG MT LOZG: 1.0000 | LOZENGE | OROMUCOSAL | Status: DC | PRN

## 2018-12-29 MED ORDER — STERILE WATER FOR IRRIGATION IR SOLN
Status: DC | PRN
  Administered 2018-12-29: 1000 mL

## 2018-12-29 MED ORDER — LIDOCAINE HCL 2 % IJ SOLN
INTRAMUSCULAR | Status: AC
  Filled 2018-12-29: qty 20

## 2018-12-29 MED ORDER — METHOCARBAMOL 500 MG PO TABS: 750.0000 mg | ORAL_TABLET | Freq: Four times a day (QID) | ORAL | Status: DC | PRN

## 2018-12-29 MED ORDER — LIDOCAINE 2% (20 MG/ML) 5 ML SYRINGE
INTRAMUSCULAR | Status: AC
  Filled 2018-12-29: qty 5

## 2018-12-29 MED ORDER — MIRABEGRON ER 25 MG PO TB24
25.0000 mg | ORAL_TABLET | Freq: Every morning | ORAL | Status: DC
  Filled 2018-12-29: qty 1

## 2018-12-29 MED ORDER — EPHEDRINE 5 MG/ML INJ
INTRAVENOUS | Status: AC
  Filled 2018-12-29: qty 10

## 2018-12-29 MED ORDER — GUAIFENESIN-DM 100-10 MG/5ML PO SYRP: 10.0000 mL | ORAL_SOLUTION | ORAL | Status: DC | PRN

## 2018-12-29 MED ORDER — ACETAMINOPHEN 500 MG PO TABS
1000.0000 mg | ORAL_TABLET | ORAL | Status: AC
  Administered 2018-12-29: 1000 mg via ORAL
  Filled 2018-12-29: qty 2

## 2018-12-29 MED ORDER — KETAMINE HCL 10 MG/ML IJ SOLN
INTRAMUSCULAR | Status: DC | PRN
  Administered 2018-12-29: 30 mg via INTRAVENOUS

## 2018-12-29 MED ORDER — LACTATED RINGERS IV SOLN: 1000.0000 mL | Freq: Three times a day (TID) | INTRAVENOUS | Status: DC | PRN

## 2018-12-29 MED ORDER — CHOLECALCIFEROL 10 MCG (400 UNIT) PO TABS
400.0000 [IU] | ORAL_TABLET | Freq: Every evening | ORAL | Status: DC
  Filled 2018-12-29: qty 1

## 2018-12-29 MED ORDER — BISACODYL 10 MG RE SUPP: 10.0000 mg | Freq: Every day | RECTAL | Status: DC | PRN

## 2018-12-29 MED ORDER — CO Q-10 300 MG PO CAPS: 300.0000 mg | ORAL_CAPSULE | Freq: Every day | ORAL | Status: DC

## 2018-12-29 MED ORDER — GABAPENTIN 300 MG PO CAPS
300.0000 mg | ORAL_CAPSULE | ORAL | Status: AC
  Administered 2018-12-29: 300 mg via ORAL
  Filled 2018-12-29: qty 1

## 2018-12-29 MED ORDER — DIPHENHYDRAMINE HCL 12.5 MG/5ML PO ELIX
12.5000 mg | ORAL_SOLUTION | Freq: Four times a day (QID) | ORAL | Status: DC | PRN
  Administered 2018-12-30: 12.5 mg via ORAL
  Filled 2018-12-29: qty 5

## 2018-12-29 MED ORDER — 0.9 % SODIUM CHLORIDE (POUR BTL) OPTIME
TOPICAL | Status: DC | PRN
  Administered 2018-12-29: 1000 mL

## 2018-12-29 MED ORDER — ONDANSETRON HCL 4 MG/2ML IJ SOLN
INTRAMUSCULAR | Status: AC
  Filled 2018-12-29: qty 2

## 2018-12-29 MED ORDER — LACTATED RINGERS IV SOLN
INTRAVENOUS | Status: DC
  Administered 2018-12-29 (×2): via INTRAVENOUS

## 2018-12-29 MED ORDER — TRAMADOL HCL 50 MG PO TABS
50.0000 mg | ORAL_TABLET | Freq: Four times a day (QID) | ORAL | Status: DC | PRN
  Administered 2018-12-29 – 2018-12-30 (×3): 100 mg via ORAL
  Filled 2018-12-29 (×3): qty 2

## 2018-12-29 MED ORDER — ASPIRIN EC 81 MG PO TBEC
81.0000 mg | DELAYED_RELEASE_TABLET | Freq: Every morning | ORAL | Status: DC
  Administered 2018-12-30: 81 mg via ORAL
  Filled 2018-12-29: qty 1

## 2018-12-29 MED ORDER — PROMETHAZINE HCL 25 MG/ML IJ SOLN: 6.2500 mg | INTRAMUSCULAR | Status: DC | PRN

## 2018-12-29 MED ORDER — FENTANYL CITRATE (PF) 100 MCG/2ML IJ SOLN
25.0000 ug | INTRAMUSCULAR | Status: DC | PRN
  Administered 2018-12-29 (×2): 25 ug via INTRAVENOUS

## 2018-12-29 MED ORDER — SODIUM CHLORIDE 0.9 % IV SOLN
INTRAVENOUS | Status: DC
  Administered 2018-12-29: 19:00:00 via INTRAVENOUS

## 2018-12-29 MED ORDER — POLYETHYLENE GLYCOL 3350 17 G PO PACK: 17.0000 g | PACK | Freq: Two times a day (BID) | ORAL | Status: DC | PRN

## 2018-12-29 MED ORDER — FENTANYL CITRATE (PF) 250 MCG/5ML IJ SOLN
INTRAMUSCULAR | Status: AC
  Filled 2018-12-29: qty 5

## 2018-12-29 MED ORDER — GABAPENTIN 300 MG PO CAPS
300.0000 mg | ORAL_CAPSULE | Freq: Two times a day (BID) | ORAL | Status: DC
  Administered 2018-12-29 – 2018-12-30 (×2): 300 mg via ORAL
  Filled 2018-12-29 (×2): qty 1

## 2018-12-29 MED ORDER — HYDROCORTISONE 2.5 % RE CREA: 1.0000 "application " | TOPICAL_CREAM | Freq: Four times a day (QID) | RECTAL | Status: DC | PRN

## 2018-12-29 MED ORDER — CEFAZOLIN SODIUM-DEXTROSE 2-4 GM/100ML-% IV SOLN
2.0000 g | INTRAVENOUS | Status: AC
  Administered 2018-12-29 (×2): 2 g via INTRAVENOUS
  Filled 2018-12-29: qty 100

## 2018-12-29 MED ORDER — SODIUM CHLORIDE 0.9 % IV SOLN: 250.0000 mL | INTRAVENOUS | Status: DC | PRN

## 2018-12-29 MED ORDER — ONDANSETRON HCL 4 MG/2ML IJ SOLN
INTRAMUSCULAR | Status: DC | PRN
  Administered 2018-12-29: 4 mg via INTRAVENOUS

## 2018-12-29 MED ORDER — BUPIVACAINE-EPINEPHRINE (PF) 0.5% -1:200000 IJ SOLN
INTRAMUSCULAR | Status: AC
  Filled 2018-12-29: qty 60

## 2018-12-29 MED ORDER — HYDROCORTISONE 1 % EX CREA: 1.0000 "application " | TOPICAL_CREAM | Freq: Three times a day (TID) | CUTANEOUS | Status: DC | PRN

## 2018-12-29 MED ORDER — SIMETHICONE 80 MG PO CHEW: 40.0000 mg | CHEWABLE_TABLET | Freq: Four times a day (QID) | ORAL | Status: DC | PRN

## 2018-12-29 MED ORDER — CEFAZOLIN SODIUM-DEXTROSE 2-4 GM/100ML-% IV SOLN
INTRAVENOUS | Status: AC
  Filled 2018-12-29: qty 100

## 2018-12-29 MED ORDER — HYDROMORPHONE HCL 1 MG/ML IJ SOLN
0.5000 mg | INTRAMUSCULAR | Status: DC | PRN
  Administered 2018-12-29: 1 mg via INTRAVENOUS
  Filled 2018-12-29: qty 1

## 2018-12-29 MED ORDER — LIDOCAINE 2% (20 MG/ML) 5 ML SYRINGE
INTRAMUSCULAR | Status: DC | PRN
  Administered 2018-12-29: 50 mg via INTRAVENOUS

## 2018-12-29 MED ORDER — BUPIVACAINE LIPOSOME 1.3 % IJ SUSP
INTRAMUSCULAR | Status: DC | PRN
  Administered 2018-12-29: 20 mL

## 2018-12-29 MED ORDER — SODIUM CHLORIDE 0.9% FLUSH
3.0000 mL | Freq: Two times a day (BID) | INTRAVENOUS | Status: DC
  Administered 2018-12-30: 3 mL via INTRAVENOUS

## 2018-12-29 MED ORDER — LIDOCAINE 20MG/ML (2%) 15 ML SYRINGE OPTIME: INTRAMUSCULAR | Status: DC | PRN

## 2018-12-29 MED ORDER — CEFAZOLIN SODIUM-DEXTROSE 2-4 GM/100ML-% IV SOLN
2.0000 g | Freq: Three times a day (TID) | INTRAVENOUS | Status: AC
  Administered 2018-12-29: 2 g via INTRAVENOUS
  Filled 2018-12-29: qty 100

## 2018-12-29 MED ORDER — DEXAMETHASONE SODIUM PHOSPHATE 10 MG/ML IJ SOLN
INTRAMUSCULAR | Status: DC | PRN
  Administered 2018-12-29: 8 mg via INTRAVENOUS

## 2018-12-29 MED ORDER — DOCUSATE SODIUM 100 MG PO CAPS
100.0000 mg | ORAL_CAPSULE | Freq: Every day | ORAL | Status: DC
  Administered 2018-12-30: 100 mg via ORAL
  Filled 2018-12-29: qty 1

## 2018-12-29 MED ORDER — ROCURONIUM BROMIDE 100 MG/10ML IV SOLN
INTRAVENOUS | Status: AC
  Filled 2018-12-29: qty 1

## 2018-12-29 MED ORDER — TRAMADOL HCL 50 MG PO TABS: 50.0000 mg | ORAL_TABLET | Freq: Four times a day (QID) | ORAL | 0 refills | Status: DC | PRN

## 2018-12-29 MED ORDER — LIDOCAINE 20MG/ML (2%) 15 ML SYRINGE OPTIME
INTRAMUSCULAR | Status: DC | PRN
  Administered 2018-12-29: 1.5 mg/kg/h via INTRAVENOUS

## 2018-12-29 MED ORDER — POLYETHYLENE GLYCOL 3350 17 G PO PACK: 17.0000 g | PACK | Freq: Every day | ORAL | Status: DC | PRN

## 2018-12-29 MED ORDER — POLYETHYLENE GLYCOL 3350 17 G PO PACK
17.0000 g | PACK | Freq: Two times a day (BID) | ORAL | Status: DC
  Administered 2018-12-30: 17 g via ORAL
  Filled 2018-12-29: qty 1

## 2018-12-29 MED ORDER — DIPHENHYDRAMINE HCL 50 MG/ML IJ SOLN: 12.5000 mg | Freq: Four times a day (QID) | INTRAMUSCULAR | Status: DC | PRN

## 2018-12-29 MED ORDER — PHENOL 1.4 % MT LIQD
1.0000 | OROMUCOSAL | Status: DC | PRN
  Filled 2018-12-29: qty 177

## 2018-12-29 MED ORDER — HYDRALAZINE HCL 20 MG/ML IJ SOLN: 5.0000 mg | INTRAMUSCULAR | Status: DC | PRN

## 2018-12-29 MED ORDER — PROCHLORPERAZINE EDISYLATE 10 MG/2ML IJ SOLN: 5.0000 mg | Freq: Four times a day (QID) | INTRAMUSCULAR | Status: DC | PRN

## 2018-12-29 MED ORDER — METOPROLOL TARTRATE 5 MG/5ML IV SOLN: 5.0000 mg | Freq: Four times a day (QID) | INTRAVENOUS | Status: DC | PRN

## 2018-12-29 MED ORDER — ROCURONIUM BROMIDE 10 MG/ML (PF) SYRINGE
PREFILLED_SYRINGE | INTRAVENOUS | Status: DC | PRN
  Administered 2018-12-29: 60 mg via INTRAVENOUS
  Administered 2018-12-29 (×6): 10 mg via INTRAVENOUS
  Administered 2018-12-29: 15 mg via INTRAVENOUS

## 2018-12-29 MED ORDER — BUPIVACAINE HCL (PF) 0.25 % IJ SOLN
INTRAMUSCULAR | Status: DC | PRN
  Administered 2018-12-29: 60 mL

## 2018-12-29 MED ORDER — LIP MEDEX EX OINT
1.0000 "application " | TOPICAL_OINTMENT | Freq: Two times a day (BID) | CUTANEOUS | Status: DC
  Administered 2018-12-29 – 2018-12-30 (×2): 1 via TOPICAL
  Filled 2018-12-29 (×2): qty 7

## 2018-12-29 MED ORDER — ENOXAPARIN SODIUM 40 MG/0.4ML ~~LOC~~ SOLN
40.0000 mg | SUBCUTANEOUS | Status: DC
  Administered 2018-12-30: 40 mg via SUBCUTANEOUS
  Filled 2018-12-29: qty 0.4

## 2018-12-29 MED ORDER — BUPIVACAINE-EPINEPHRINE (PF) 0.25% -1:200000 IJ SOLN
INTRAMUSCULAR | Status: AC
  Filled 2018-12-29: qty 30

## 2018-12-29 MED ORDER — ONDANSETRON HCL 4 MG/2ML IJ SOLN: 4.0000 mg | Freq: Four times a day (QID) | INTRAMUSCULAR | Status: DC | PRN

## 2018-12-29 MED ORDER — SUGAMMADEX SODIUM 200 MG/2ML IV SOLN
INTRAVENOUS | Status: AC
  Filled 2018-12-29: qty 2

## 2018-12-29 MED ORDER — ATORVASTATIN CALCIUM 20 MG PO TABS
20.0000 mg | ORAL_TABLET | Freq: Every day | ORAL | Status: DC
  Administered 2018-12-29: 20 mg via ORAL
  Filled 2018-12-29: qty 1

## 2018-12-29 MED ORDER — ENALAPRILAT 1.25 MG/ML IV SOLN
0.6250 mg | Freq: Four times a day (QID) | INTRAVENOUS | Status: DC | PRN
  Filled 2018-12-29: qty 1

## 2018-12-29 MED ORDER — ONDANSETRON 4 MG PO TBDP: 4.0000 mg | ORAL_TABLET | Freq: Four times a day (QID) | ORAL | Status: DC | PRN

## 2018-12-29 MED ORDER — BUPIVACAINE LIPOSOME 1.3 % IJ SUSP
20.0000 mL | Freq: Once | INTRAMUSCULAR | Status: DC
  Filled 2018-12-29: qty 20

## 2018-12-29 MED ORDER — MEPERIDINE HCL 50 MG/ML IJ SOLN: 6.2500 mg | INTRAMUSCULAR | Status: DC | PRN

## 2018-12-29 MED ORDER — DEXAMETHASONE SODIUM PHOSPHATE 10 MG/ML IJ SOLN
INTRAMUSCULAR | Status: AC
  Filled 2018-12-29: qty 1

## 2018-12-29 MED ORDER — PROPOFOL 10 MG/ML IV BOLUS
INTRAVENOUS | Status: AC
  Filled 2018-12-29: qty 20

## 2018-12-29 MED ORDER — EPHEDRINE SULFATE-NACL 50-0.9 MG/10ML-% IV SOSY
PREFILLED_SYRINGE | INTRAVENOUS | Status: DC | PRN
  Administered 2018-12-29 (×3): 10 mg via INTRAVENOUS

## 2018-12-29 MED ORDER — FENTANYL CITRATE (PF) 100 MCG/2ML IJ SOLN
INTRAMUSCULAR | Status: DC | PRN
  Administered 2018-12-29: 25 ug via INTRAVENOUS
  Administered 2018-12-29 (×5): 50 ug via INTRAVENOUS

## 2018-12-29 MED ORDER — FENTANYL CITRATE (PF) 100 MCG/2ML IJ SOLN
INTRAMUSCULAR | Status: AC
  Administered 2018-12-29: 25 ug via INTRAVENOUS
  Filled 2018-12-29: qty 2

## 2018-12-29 MED ORDER — PROPOFOL 10 MG/ML IV BOLUS
INTRAVENOUS | Status: DC | PRN
  Administered 2018-12-29: 150 mg via INTRAVENOUS

## 2018-12-29 MED ORDER — BUPIVACAINE HCL (PF) 0.25 % IJ SOLN
INTRAMUSCULAR | Status: AC
  Filled 2018-12-29: qty 60

## 2018-12-29 MED ORDER — PROCHLORPERAZINE MALEATE 10 MG PO TABS: 10.0000 mg | ORAL_TABLET | Freq: Four times a day (QID) | ORAL | Status: DC | PRN

## 2018-12-29 SURGICAL SUPPLY — 56 items
APL SWBSTK 6 STRL LF DISP (MISCELLANEOUS) ×1
APPLICATOR COTTON TIP 6 STRL (MISCELLANEOUS) IMPLANT
APPLICATOR COTTON TIP 6IN STRL (MISCELLANEOUS) ×3
APPLIER CLIP 5 13 M/L LIGAMAX5 (MISCELLANEOUS)
APR CLP MED LRG 5 ANG JAW (MISCELLANEOUS)
BINDER ABDOMINAL 12 ML 46-62 (SOFTGOODS) ×2 IMPLANT
CABLE HIGH FREQUENCY MONO STRZ (ELECTRODE) ×3 IMPLANT
CHLORAPREP W/TINT 26ML (MISCELLANEOUS) ×3 IMPLANT
CLIP APPLIE 5 13 M/L LIGAMAX5 (MISCELLANEOUS) IMPLANT
CLOSURE WOUND 1/2 X4 (GAUZE/BANDAGES/DRESSINGS) ×2
COVER SURGICAL LIGHT HANDLE (MISCELLANEOUS) ×3 IMPLANT
COVER WAND RF STERILE (DRAPES) ×3 IMPLANT
DECANTER SPIKE VIAL GLASS SM (MISCELLANEOUS) ×5 IMPLANT
DEVICE SECURE STRAP 25 ABSORB (INSTRUMENTS) ×2 IMPLANT
DEVICE TROCAR PUNCTURE CLOSURE (ENDOMECHANICALS) ×3 IMPLANT
DRAPE WARM FLUID 44X44 (DRAPE) ×3 IMPLANT
DRSG TEGADERM 2-3/8X2-3/4 SM (GAUZE/BANDAGES/DRESSINGS) ×13 IMPLANT
DRSG TEGADERM 4X4.75 (GAUZE/BANDAGES/DRESSINGS) ×5 IMPLANT
ELECT REM PT RETURN 15FT ADLT (MISCELLANEOUS) ×3 IMPLANT
GAUZE SPONGE 2X2 8PLY STRL LF (GAUZE/BANDAGES/DRESSINGS) IMPLANT
GLOVE BIOGEL PI IND STRL 6 (GLOVE) IMPLANT
GLOVE BIOGEL PI IND STRL 6.5 (GLOVE) IMPLANT
GLOVE BIOGEL PI IND STRL 7.0 (GLOVE) IMPLANT
GLOVE BIOGEL PI INDICATOR 6 (GLOVE) ×4
GLOVE BIOGEL PI INDICATOR 6.5 (GLOVE) ×8
GLOVE BIOGEL PI INDICATOR 7.0 (GLOVE) ×4
GLOVE ECLIPSE 8.0 STRL XLNG CF (GLOVE) ×3 IMPLANT
GLOVE INDICATOR 8.0 STRL GRN (GLOVE) ×3 IMPLANT
GLOVE SURG SS PI 6.0 STRL IVOR (GLOVE) ×4 IMPLANT
GOWN STRL REUS W/ TWL LRG LVL3 (GOWN DISPOSABLE) IMPLANT
GOWN STRL REUS W/TWL LRG LVL3 (GOWN DISPOSABLE) ×9
GOWN STRL REUS W/TWL XL LVL3 (GOWN DISPOSABLE) ×8 IMPLANT
IRRIG SUCT STRYKERFLOW 2 WTIP (MISCELLANEOUS) ×3
IRRIGATION SUCT STRKRFLW 2 WTP (MISCELLANEOUS) IMPLANT
KIT BASIN OR (CUSTOM PROCEDURE TRAY) ×3 IMPLANT
MARKER SKIN DUAL TIP RULER LAB (MISCELLANEOUS) ×3 IMPLANT
MESH VENTRALIGHT ST 8X10 (Mesh General) ×2 IMPLANT
NDL SPNL 22GX3.5 QUINCKE BK (NEEDLE) IMPLANT
NEEDLE SPNL 22GX3.5 QUINCKE BK (NEEDLE) ×3 IMPLANT
PAD POSITIONING PINK XL (MISCELLANEOUS) ×3 IMPLANT
PENCIL HANDSWITCHING (ELECTRODE) ×2 IMPLANT
SCISSORS LAP 5X35 DISP (ENDOMECHANICALS) ×3 IMPLANT
SET TUBE SMOKE EVAC HIGH FLOW (TUBING) ×3 IMPLANT
SLEEVE ADV FIXATION 5X100MM (TROCAR) ×7 IMPLANT
SPONGE GAUZE 2X2 STER 10/PKG (GAUZE/BANDAGES/DRESSINGS) ×2
STRIP CLOSURE SKIN 1/2X4 (GAUZE/BANDAGES/DRESSINGS) ×4 IMPLANT
SUT MNCRL AB 4-0 PS2 18 (SUTURE) ×5 IMPLANT
SUT PDS AB 1 CT1 27 (SUTURE) ×6 IMPLANT
SUT PROLENE 1 CT 1 30 (SUTURE) ×23 IMPLANT
SUT VIC AB 2-0 SH 18 (SUTURE) ×2 IMPLANT
TAPE UMBILICAL COTTON 1/8X30 (MISCELLANEOUS) ×2 IMPLANT
TOWEL OR 17X26 10 PK STRL BLUE (TOWEL DISPOSABLE) ×3 IMPLANT
TRAY LAPAROSCOPIC (CUSTOM PROCEDURE TRAY) ×3 IMPLANT
TROCAR ADV FIXATION 11X100MM (TROCAR) IMPLANT
TROCAR ADV FIXATION 5X100MM (TROCAR) ×3 IMPLANT
TROCAR BLADELESS OPT 5 100 (ENDOMECHANICALS) ×3 IMPLANT

## 2018-12-29 NOTE — Anesthesia Preprocedure Evaluation (Signed)
Anesthesia Evaluation  Patient identified by MRN, date of birth, ID band Patient awake    Reviewed: Allergy & Precautions, NPO status , Patient's Chart, lab work & pertinent test results  Airway Mallampati: II  TM Distance: >3 FB Neck ROM: Full    Dental  (+) Dental Advisory Given   Pulmonary neg pulmonary ROS, former smoker,    Pulmonary exam normal breath sounds clear to auscultation       Cardiovascular hypertension, + dysrhythmias + Valvular Problems/Murmurs  Rhythm:Regular Rate:Normal + Systolic murmurs Left ventricle: The cavity size was normal. Wall thickness was   increased in a pattern of mild LVH. Systolic function was normal.   The estimated ejection fraction was in the range of 60% to 65%.   Wall motion was normal; there were no regional wall motion   abnormalities. Doppler parameters are consistent with abnormal   left ventricular relaxation (grade 1 diastolic dysfunction). - Aortic valve: There was no stenosis. There was trivial   regurgitation. - Mitral valve: Mildly calcified annulus. There was trivial   regurgitation. - Right ventricle: The cavity size was normal. Systolic function   was normal. - Pulmonary arteries: No complete TR doppler jet so unable to   estimate PA systolic pressure. - Inferior vena cava: The vessel was normal in size. The   respirophasic diameter changes were in the normal range (>= 50%),   consistent with normal central venous pressure.  Impressions:  - Normal LV size with mild LV hypertrophy. EF 60-65%. Normal RV   size and systolic function. No significant valvular   abnormalities.   Neuro/Psych CVA negative psych ROS   GI/Hepatic negative GI ROS, Neg liver ROS,   Endo/Other  negative endocrine ROS  Renal/GU negative Renal ROS     Musculoskeletal negative musculoskeletal ROS (+)   Abdominal   Peds  Hematology negative hematology ROS (+)   Anesthesia Other  Findings   Reproductive/Obstetrics                             Anesthesia Physical  Anesthesia Plan  ASA: III  Anesthesia Plan: General   Post-op Pain Management:    Induction: Intravenous  PONV Risk Score and Plan: 2 and Ondansetron, Dexamethasone and Treatment may vary due to age or medical condition  Airway Management Planned: Oral ETT  Additional Equipment: None  Intra-op Plan:   Post-operative Plan: Extubation in OR  Informed Consent: I have reviewed the patients History and Physical, chart, labs and discussed the procedure including the risks, benefits and alternatives for the proposed anesthesia with the patient or authorized representative who has indicated his/her understanding and acceptance.     Dental advisory given  Plan Discussed with: CRNA and Surgeon  Anesthesia Plan Comments:         Anesthesia Quick Evaluation

## 2018-12-29 NOTE — Interval H&P Note (Signed)
History and Physical Interval Note:  12/29/2018 9:08 AM  Matthew Hodges  has presented today for surgery, with the diagnosis of [place]  The various methods of treatment have been discussed with the patient and family. After consideration of risks, benefits and other options for treatment, the patient has consented to  Procedure(s): [place] (N/A) as a surgical intervention .  The patient's history has been reviewed, patient examined, no change in status, stable for surgery.  I have reviewed the patient's chart and labs.  Questions were answered to the patient's satisfaction.    I have re-reviewed the the patient's records, history, medications, and allergies.  I have re-examined the patient.  I again discussed intraoperative plans and goals of post-operative recovery.  The patient agrees to proceed.  Matthew Hodges  [birth year] [id number]  Patient Care Team: Darcus Austin, MD (Inactive) as PCP - General (Family Medicine) Druscilla Brownie, MD (Dermatology) Carolan Clines, MD (Inactive) as Attending Physician (Urology) Michael Boston, MD as Consulting Physician (General Surgery)  Patient Active Problem List   Diagnosis Date Noted  . Obesity (BMI 30-39.9) 12/24/2017  . Left obturator hernia s/p lap repair w mesh 12/24/2017 12/24/2017  . Left femoral hernia s/p lap repair w mesh 12/24/2017 12/24/2017  . Bilateral inguinal hernia (BIH) s/p lap repair w mesh 12/24/2017 12/24/2017  . Abdominal pain 02/04/2016  . Hypertension   . Cerebral artery disease   . Hyperlipidemia   . Aortic stenosis 11/11/2011  . Vascular calcification 10/18/2011    Past Medical History:  Diagnosis Date  . Aortic atherosclerosis ([place]) 11/10/2017   noted on CT abd/pelvis  . Aortic stenosis 2012  . Basal cell carcinoma    "sqattered"  . BBB (bundle branch block) 04/17/2015   Right   . Bilateral inguinal hernia  11/10/2017   possible small, noted on CT abd/pelvis  . Cerebral artery disease   . Claudication of both lower extremities ([place])   . Diverticular disease   . Frequent urination   . Hearing impaired    bilateral hearing aids  . Hearing loss   . Heart murmur    "after surgeries before"  . 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 Hyperlipidemia   . Hypertension    hx. of . no meds since 2004  . Prostate cancer [facility])    treated with radiation  . Recurrent ventral incisional hernia s/p lap re-repair w mesh May 2016 04/23/2015  . Right acoustic neuroma ([place])   . SBO (small bowel obstruction) ([place]) 03/2016   last 04-21-16 admission([facility])"multiple occurrences"  . Squamous carcinoma    "sqattered"Scalp 1 polyethelene skin prostesis left frontal scalp area-removable.  . Stroke ([place]) 1979   no deficits  . Testicular cyst    history of right  . Wears dentures    upper and lower  . Wears glasses   . Wears hearing aid in both ears     Past Surgical History:  Procedure Laterality Date  . COLON SURGERY    . COLONOSCOPY    . COLOSTOMY TAKEDOWN  08/1999  . HERNIA REPAIR     "lots; had diverticulitis years ago"  . INCISIONAL HERNIA REPAIR  2001   open w mesh  . INGUINAL HERNIA REPAIR N/A 12/24/2017   Procedure: LAPAROSCOPIC  BILATERAL INGUINAL HERNIA REPAIR WITH MESH, LYSIS OF ADHESIONS;  Surgeon: Michael Boston, MD;  Location: [facility];  Service: General;  Laterality: N/A;  . LAPAROSCOPIC INCISIONAL / UMBILICAL / VENTRAL HERNIA REPAIR  2008   with mesh.  LLQ old colostomy site  . LAPAROSCOPIC LYSIS OF ADHESIONS N/A 06/25/2016   Procedure: LAPAROSCOPIC LYSIS OF ADHESIONS;  Surgeon: Michael Boston, MD;  Location: [facility];  Service: General;  Laterality: N/A;  . LYSIS OF ADHESION N/A 04/23/2015   Procedure: LYSIS OF ADHESION;  Surgeon: Michael Boston, MD;  Location: [facility];  Service: General;  Laterality: N/A;  . MOHS SURGERY  02/2017  . PARTIAL  COLECTOMY  01/1999   Hartmans procedure with colostomy  . PROSTATE BIOPSY  ~ 2002  . REMOVE SUTURE UNDER ANESTHESIA  2011   stitch abscesses LLQ & midline  . SMALL BOWEL REPAIR  03/1999   postop abscesses with fistula.  Abscesses drained.  Fistula repaired.  . TESTICLE SURGERY Right 1998  . VENTRAL HERNIA REPAIR N/A 04/23/2015   Procedure: LAPAROSCOPIC VENTRAL WALL HERNIA REPAIR AND LAP LYSIS OF ADHESIONS;  Surgeon: Michael Boston, MD;  Location: [facility];  Service: General;  Laterality: N/A;  With MESH    Social History   Socioeconomic History  . Marital status: Married    Spouse name: Not on file  . Number of children: Not on file  . Years of education: Not on file  . Highest education level: Not on file  Occupational History  . Not on file  Social Needs  . Financial resource strain: Not on file  . Food insecurity:    Worry: Not on file    Inability: Not on file  . Transportation needs:    Medical: Not on file    Non-medical: Not on file  Tobacco Use  . Smoking status: Former Smoker    Packs/day: 2.00    Years: 47.00    Pack years: 94.00    Types: Cigarettes    Last attempt to quit: 11/30/2004    Years since quitting: 14.0  . Smokeless tobacco: Never Used  Substance and Sexual Activity  . Alcohol use: No    Frequency: Never    Comment: Quit-recovering alcoholic sober since 7124  . Drug use: No  . Sexual activity: Not Currently  Lifestyle  . Physical activity:    Days per week: Not on file    Minutes per session: Not on file  . Stress: Not on file  Relationships  . Social connections:    Talks on phone: Not on file    Gets together: Not on file    Attends religious service: Not on file    Active member of club or organization: Not on file    Attends meetings of clubs or organizations: Not on file    Relationship status: Not on file  . Intimate partner violence:    Fear of current or ex partner: Not on file    Emotionally abused: Not on file    Physically abused:  Not on file    Forced sexual activity: Not on file  Other Topics Concern  . Not on file  Social History Narrative  . Not on file    History reviewed. No pertinent family history.  Medications Prior to Admission  Medication Sig Dispense Refill Last Dose  . aspirin EC 81 MG tablet Take 81 mg by mouth every morning.    12/28/2018 at Unknown time  . atorvastatin (LIPITOR) 40 MG tablet Take 20 mg by mouth at bedtime.   12/28/2018 at Unknown time  . Cholecalciferol (VITAMIN D3) 400 UNITS tablet Take 400 Units  by mouth every evening.    12/28/2018 at Unknown time  . Coenzyme Q10 (CO Q-10) 300 MG CAPS Take 300 mg by mouth daily.   12/28/2018 at Unknown time  . docusate sodium (COLACE) 100 MG capsule Take 100 mg by mouth daily.   12/28/2018 at Unknown time  . mirabegron ER (MYRBETRIQ) 25 MG TB24 tablet Take 25 mg by mouth every morning.   12/29/2018 at 0630  . Multiple Vitamins-Minerals (MEGA MULTIVITAMIN FOR MEN PO) Take 1 tablet by mouth every morning.    12/28/2018 at Unknown time  . Omega-3 Fatty Acids (FISH OIL) 1200 MG CAPS Take 1,200 mg by mouth daily.   12/28/2018 at Unknown time  . methocarbamol (ROBAXIN) 750 MG tablet Take 1 tablet (750 mg total) by mouth 4 (four) times daily as needed (use for muscle cramps/pain). (Patient not taking: Reported on 12/20/2018) 30 tablet 2 Not Taking at Unknown time  . traMADol (ULTRAM) 50 MG tablet Take 1-2 tablets (50-100 mg total) by mouth every 6 (six) hours as needed for moderate pain or severe pain. (Patient not taking: Reported on 12/20/2018) 30 tablet 0 Not Taking at Unknown time    Current Facility-Administered Medications  Medication Dose Route Frequency Provider Last Rate Last Dose  . bupivacaine liposome (EXPAREL) 1.3 % injection 266 mg  20 mL Infiltration Once Michael Boston, MD      . ceFAZolin (ANCEF) IVPB 2g/100 mL premix  2 g Intravenous On Call to OR Michael Boston, MD      . lactated ringers infusion   Intravenous Continuous Barnet Glasgow, MD 50  mL/hr at 12/29/18 1287     Facility-Administered Medications Ordered in Other Encounters  Medication Dose Route Frequency Provider Last Rate Last Dose  . bupivacaine liposome (EXPAREL) 1.3 % injection 266 mg  20 mL Infiltration Once Michael Boston, MD         Allergies  Allergen Reactions  . Latex Rash    Prolong contact     BP (!) 151/71   Pulse 75   Temp 97.7 F (36.5 C) (Oral)   Resp 18   Ht 5' 6.5" (1.689 m)   Wt 91.2 kg   SpO2 100%   BMI 31.96 kg/m   Labs: Results for orders placed or performed during the hospital encounter of 12/28/18 (from the past 48 hour(s))  CBC     Status: None   Collection Time: 12/28/18  2:51 PM  Result Value Ref Range   WBC 7.7 4.0 - 10.5 K/uL   RBC 4.38 4.22 - 5.81 MIL/uL   Hemoglobin 13.5 13.0 - 17.0 g/dL   HCT 41.4 39.0 - 52.0 %   MCV 94.5 80.0 - 100.0 fL   MCH 30.8 26.0 - 34.0 pg   MCHC 32.6 30.0 - 36.0 g/dL   RDW 12.7 11.5 - 15.5 %   Platelets 268 150 - 400 K/uL   nRBC 0.0 0.0 - 0.2 %    Comment: Performed at [facility], [place] [address] [place], [place] [address]    Imaging / Studies: No results found.   [name], M.D., F.A.C.S. Gastrointestinal and Minimally Invasive Surgery [facility] [address] [place], [place] [address] [phone] Main / Paging  12/29/2018 9:08 AM    [name]

## 2018-12-29 NOTE — Anesthesia Procedure Notes (Signed)
Procedure Name: Intubation Date/Time: 12/29/2018 10:01 AM Performed by: Junius Faucett D, CRNA Pre-anesthesia Checklist: Patient identified, Emergency Drugs available, Suction available and Patient being monitored Patient Re-evaluated:Patient Re-evaluated prior to induction Oxygen Delivery Method: Circle system utilized Preoxygenation: Pre-oxygenation with 100% oxygen Induction Type: IV induction Ventilation: Mask ventilation without difficulty Laryngoscope Size: Mac and 4 Grade View: Grade I Tube type: Oral Tube size: 7.5 mm Number of attempts: 1 Airway Equipment and Method: Stylet Placement Confirmation: ETT inserted through vocal cords under direct vision,  positive ETCO2 and breath sounds checked- equal and bilateral Secured at: 22 cm Tube secured with: Tape Dental Injury: Teeth and Oropharynx as per pre-operative assessment

## 2018-12-29 NOTE — Op Note (Signed)
12/29/2018  PATIENT:  Matthew Hodges  78 y.o. male  Patient Care Team: Darcus Austin, MD (Inactive) as PCP - General (Family Medicine) Druscilla Brownie, MD (Dermatology) Carolan Clines, MD (Inactive) as Attending Physician (Urology) Michael Boston, MD as Consulting Physician (General Surgery)  PRE-OPERATIVE DIAGNOSIS:  INCISIONAL ABDOMINAL WALL HERNIA  POST-OPERATIVE DIAGNOSIS:  incarcerated incisional hernia  PROCEDURE:    LAPAROSCOPIC LYSIS OF ADHESIONS X2.5 HOURS OPEN LYSIS OF ADHESIONS X1 HOUR [place]  SURGEON:  [name], MD  ASSISTANT: Nurse   ANESTHESIA:     General  Nerve block provided with liposomal bupivacaine (Experel) mixed with 0.25% bupivacaine as a Bilateral TAP block x 40 mL PER SIDE at the level of the transverse abdominis & preperitoneal spaces along the flank at the anterior axillary line, from subcostal ridge to iliac crest under laparoscopic guidance   Local anesthesia field block: (0.25% bupivacaine &  liposomal  Bupivacaine [Experel])  EBL:  Total I/O In: 1000 [I.V.:1000] Out: -   Per anesthesia record  Delay start of Pharmacological VTE agent (>24hrs) due to surgical blood loss or risk of bleeding:  no  DRAINS: none   SPECIMEN:  No Specimen  DISPOSITION OF SPECIMEN:  N/A  COUNTS:  YES  PLAN OF CARE: Admit for overnight observation  PATIENT DISPOSITION:  PACU - hemodynamically stable.  INDICATION: Pleasant patient that required emergency sigmoid colectomy and Hartman resection for perforated diverticulitis in 2000.  Complicated by wound infections and incisional hernias status post colostomy takedown 2000 and onlay repair with mesh 2001.  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  Developed incisional hernia at colostomy site.  I laparoscopically repaired this in 2008.    He then developed central incisional hernias.  I laparoscopically repair those with  large sheet of mesh in 2016.  Did laparoscopic lysis adhesions for small bowel obstruction in 2017.  He developed bilateral inguinal as well as left femoral and obturator hernias.  I laparoscopically fixed those in January 2019.  He then developed a new hernia in the right paramedian region.  CT scan showed a knuckle of bowel within this, most likely fraction of the old prior mesh versus herniation through the central part of the mesh in the past.  Recommendation was made for surgical repair:   The anatomy & physiology of the abdominal wall was discussed. The pathophysiology of hernias was discussed. Natural history risks without surgery including progeressive enlargement, pain, incarceration & strangulation was discussed. Contributors to complications such as smoking, obesity, diabetes, prior surgery, etc were discussed.  I feel the risks of no intervention will lead to serious problems that outweigh the operative risks; therefore, I recommended surgery to reduce and repair the hernia. I explained laparoscopic techniques with possible need for an open approach. I noted the probable use of mesh to patch and/or buttress the hernia repair   Risks such as bleeding, infection, abscess, need for further treatment, heart attack, death, and other risks were discussed. I noted a good likelihood this will help address the problem. Goals of post-operative recovery were discussed as well. Possibility that this will not correct all symptoms was explained. I stressed the importance of low-impact activity, aggressive pain control, avoiding constipation, & not pushing through pain to minimize risk of post-operative chronic pain or injury. Possibility of reherniation especially with smoking, obesity, diabetes, immunosuppression, and other health conditions was discussed. We will work to minimize complications.  An educational handout further explaining the pathology & treatment  options was given as well. Questions were  answered. The patient expresses understanding & wishes to proceed with surgery.   OR FINDINGS: Patient had very dense adhesions of greater omentum but mostly small intestine to the anterior abdominal wall.  He had a Richter's type herniation in the right periumbilical region actually within the right center of the old mesh itself, not at the edge.  4 x 3 cm defect as well as another 3 x 2 cm defect.  Bowel able to be freed off and spared.  Was a chronic transition point suspicious for mild partial bowel obstruction.  Extensive interloop lysis adhesions done as well.  No inguinal hernia recurrence.  No old colostomy site recurrence.  Central mesh hernia defects primarily closed as well as a underlay repair  Type of repair: Laparoscopic underlay repair   Placement of mesh: Intraperitoneal underlay repair  Name of mesh: Bard Ventralight dual sided (polypropylene / Seprafilm)  Size of mesh: 25x20cm  Orientation: Transverse  Mesh overlap:  5-7cm   DESCRIPTION:   Informed consent was confirmed. The patient underwent general anaesthesia without difficulty. The patient was positioned appropriately. VTE prevention in place. The patient's abdomen was clipped, prepped, & draped in a sterile fashion. Surgical timeout confirmed our plan.  The patient was positioned in reverse Trendelenburg. Abdominal entry was gained using optical entry technique in the left upper abdomen. Entry was clean. I induced carbon dioxide insufflation. Camera inspection revealed no injury. Extra ports were carefully placed under direct laparoscopic visualization.   I could see adhesions on the parietal peritoneum under the abdominal wall.   I did laparoscopic lysis of adhesions to expose the entire anterior abdominal wall.  I primarily used focused sharp dissection.  This took quite some time.  I freed off the falciform ligament and central peritoneum to expose the retrorectus fascia   I made sure hemostasis was good.  Centrally  there was an area of small bowel that seemed to actually be going through the center of the old mesh.  This correlated with the CAT scan.  Right paramedian slightly supraumbilical.  I cannot feel like I could get safely around it and reduce it.  Therefore I made a transverse incision in the right supraumbilical paramedian region and subcutaneous tissue and found an obvious hernia sac.  I did sharp dissection to free this hernia sac all the way around down to the level of the fascia.  I opened up the hernia sac and in fact there was small intestine in the subcutaneous tissues consistent with a chronically incarcerated hernia as the CAT scan suspected.  There were 2 locations.  I split the bridge between the 2 locations to help release the tension.  I was able to get into the peritoneal cavity at the medial midline corner with laparoscopic counter observation.  Carefully freed the remaining attachments around the edge of the mesh.    I did careful inspection of the small intestine and did interloop lyse adhesions through the hernia.  Did meticulous inspection numerous times and saw no evidence of any serosal injury or enterotomy despite the 3 and half hours of lyse adhesions done laparoscopically as well as open.  Freed greater omentum off the small intestine and anterior abdominal wall.  There was not that much left, but I was able to salvage what remained.  Reduced the small bowel back in.  Debrided some thin areas of peritoneal hernia sac.  I mapped out the region using a needle passer.   To  ensure that I would have at least 5 cm radial coverage outside of the hernia defect, I chose a 25x20cm dual sided mesh.  I placed #1 Prolene stitches around its edge about every 5 cm = 12 total.  I rolled the mesh & placed into the peritoneal cavity through the hernia defect.  I primarily closed the mesh through which the bowel and herniating through using #1 Prolene in a running fashion.  I reinsufflated the abdomen.  I  did diagnostic laparoscopy and ran the small bowel from the ileocecal valve to the proximal jejunum.  Freed off numerous interloop adhesions.  It was quite apparent that the area of proximal ileum that had been chronically incarcerated through the central old mesh was a chronic transition point as the ileum distal to it was decompressed and proximally the jejunum and ileum was chronically dilated.  I freed off interloop adhesions more proximally.  I ran the small bowel twice and saw no evidence of serosal injury or other abnormality.  The bowel laid well.  No internal hernias or odd twisting.  I unrolled the mesh and positioned it appropriately.  I secured the mesh to cover up the hernia defect using a laparoscopic suture passer to pass the tails of the Prolene through the abdominal wall & tagged them with clamps for good transfascial suturing.  I started out in four corners to make sure I had the mesh centered under the hernia defect appropriately, and then proceeded to work in quadrants.  The mesh was mainly right-sided with healthy fascia lateral to the rectus reached.  The left side went in and out the old giant mesh that primarily focused on the left flank and old colostomy site.  There is definite overlap between the new and old prior mesh  We evacuated CO2 & desufflated the abdomen.  I tied the fascial stitches down. I closed the fascial defect that I placed the mesh through using #1 PDS interrupted transverse stitches primarily.  I reinsufflated the abdomen. The mesh provided at least circumferential coverage around the entire region of hernia defects.  I secured the mesh centrally with an additional trans fascial stitch in & out the mesh using #1 prolene under laparoscopic visualization at the medial and lateral corners to have 2 areas of central tacking of the mesh..   I tacked the edges & central part of the mesh to the peritoneum/posterior rectus fascia with SecureStrap absorbable tacks.  I did bring  the greater omentum down and was able to cover most of the new mesh with it.  I did reinspection. Hemostasis was good. Mesh laid well. I completed a broad field block of local anesthesia at fascial stitch sites & fascial closure areas.    Capnoperitoneum was evacuated. Ports were removed. The skin was closed with Monocryl at the port sites and Steri-Strips on the fascial stitch puncture sites.  Patient is being extubated to go to the recovery room.  I discussed operative findings, updated the patient's status, discussed probable steps to recovery, and gave postoperative recommendations to the patient and his wife holding area.  I did reach his wife on the phone per her request and discussed Intra-Op findings and goals of care.  Breast understanding and appreciation.  Given the recurrent massive adhesions with prolonged adhesion lysis, he is at risk for ileus.  We will watch at least overnight.   [name], M.D., F.A.C.S. Gastrointestinal and Minimally Invasive Surgery [facility] [address], [place],  [place] [address] [phone] Main / Paging  12/29/2018 3:34 PM

## 2018-12-29 NOTE — Transfer of Care (Signed)
Immediate Anesthesia Transfer of Care Note  Patient: Matthew Hodges  Procedure(s) Performed: LAPAROSCOPIC LYSIS OF ADHESIONS X2.5 HOURS, OPEN LYSIS OF ADHESIONS X1 HOUR, laparoscopic REPAIR OF INCARCERATED INCISIONAL HERNIA WITH MESH (N/A Abdomen)  Patient Location: PACU  Anesthesia Type:General  Level of Consciousness: awake, alert , oriented and patient cooperative  Airway & Oxygen Therapy: Patient Spontanous Breathing and Patient connected to face mask oxygen  Post-op Assessment: Report given to RN and Post -op Vital signs reviewed and stable  Post vital signs: Reviewed and stable  Last Vitals:  Vitals Value Taken Time  BP 144/83 12/29/2018  3:43 PM  Temp    Pulse 89 12/29/2018  3:45 PM  Resp 11 12/29/2018  3:45 PM  SpO2 100 % 12/29/2018  3:45 PM  Vitals shown include unvalidated device data.  Last Pain:  Vitals:   12/29/18 0831  TempSrc:   PainSc: 0-No pain      Patients Stated Pain Goal: 4 (90/30/09 2330)  Complications: No apparent anesthesia complications

## 2018-12-29 NOTE — Anesthesia Postprocedure Evaluation (Signed)
Anesthesia Post Note  Patient: Matthew Hodges  Procedure(s) Performed: LAPAROSCOPIC LYSIS OF ADHESIONS X2.5 HOURS, OPEN LYSIS OF ADHESIONS X1 HOUR, laparoscopic REPAIR OF INCARCERATED INCISIONAL HERNIA WITH MESH (N/A Abdomen)     Patient location during evaluation: PACU Anesthesia Type: General Level of consciousness: sedated and patient cooperative Pain management: pain level controlled Vital Signs Assessment: post-procedure vital signs reviewed and stable Respiratory status: spontaneous breathing Cardiovascular status: stable Anesthetic complications: no    Last Vitals:  Vitals:   12/29/18 1615 12/29/18 1630  BP: (!) 150/85 (!) 166/89  Pulse: 88 90  Resp: 18 15  Temp:  36.6 C  SpO2: 96% 96%    Last Pain:  Vitals:   12/29/18 1615  TempSrc:   PainSc: [place]

## 2018-12-30 ENCOUNTER — Encounter (HOSPITAL_COMMUNITY): Payer: Self-pay | Admitting: Surgery

## 2018-12-30 DIAGNOSIS — K430 Incisional hernia with obstruction, without gangrene: Secondary | ICD-10-CM | POA: Diagnosis not present

## 2018-12-30 NOTE — Progress Notes (Signed)
Matthew Hodges [id number] 31-Jan-1941  CARE TEAM:  PCP: Darcus Austin, MD (Inactive)  Outpatient Care Team: Patient Care Team: Darcus Austin, MD (Inactive) as PCP - General (Family Medicine) Druscilla Brownie, MD (Dermatology) Carolan Clines, MD (Inactive) as Attending Physician (Urology) Michael Boston, MD as Consulting Physician (General Surgery)  Inpatient Treatment Team: Treatment Team: Attending Provider: Michael Boston, MD; Registered Nurse: Elza Rafter, RN; Technician: Rosalia Hammers, NT; Registered Nurse: Petra Kuba, RN   Problem List:   Principal Problem:   Incarcerated incisional hernia s/p lap repair w mesh 12/29/2018 Active Problems:   Recurrent small bowel obstructions s/p robotic adhesiolysis 12/29/2018   Hypertension   Hyperlipidemia   Obesity (BMI 30-39.9)   1 Day Post-Op  12/29/2018  Procedure(s): LAPAROSCOPIC LYSIS OF ADHESIONS X2.5 HOURS, OPEN LYSIS OF ADHESIONS X1 HOUR, laparoscopic REPAIR OF INCARCERATED INCISIONAL HERNIA WITH MESH    Assessment  Recovering rather well so far.  [facility] Stay = 0 days)  Plan:  Advance diet.  Stop IV fluids.  VTE prophylaxis- SCDs, etc  Mobilize as tolerated to help recovery.  Activities already walking the hallways is an optimistic sign  D/C patient from hospital when patient meets criteria (anticipate in 0-1 day(s)):  Tolerating oral intake well Ambulating well Adequate pain control without IV medications Urinating  Having flatus Disposition planning in place   20 minutes spent in review, evaluation, examination, counseling, and coordination of care.  More than 50% of that time was spent in counseling.  12/30/2018    Subjective: (Chief complaint)  Denies pain.  Walking hallways.  Tolerated liquids and soft diet  Objective:  Vital signs:  Vitals:   12/29/18 1850 12/29/18 1954 12/30/18 0139 12/30/18 0525  BP: (!) 154/70 (!) 156/79 128/65 136/65  Pulse: 79 87 81 84   Resp: 16 16 16 16   Temp: (!) 97.5 F (36.4 C) (!) 97.4 F (36.3 C) 98.2 F (36.8 C) 98.4 F (36.9 C)  TempSrc: Oral Oral Oral Oral  SpO2: 100% 98% 92% 92%  Weight:      Height:        Last BM Date: 12/28/18  Intake/Output   Yesterday:  01/30 0701 - 01/31 0700 In: 6712 [P.O.:600; I.V.:2810; IV Piggyback:200] Out: 460 [Urine:460] This shift:  No intake/output data recorded.  Bowel function:  Flatus: No  BM:  No  Drain: (No drain)   Physical Exam:  General: Pt awake/alert/oriented x4 in no acute distress Eyes: PERRL, normal EOM.  Sclera clear.  No icterus Neuro: CN II-XII intact w/o focal sensory/motor deficits. Lymph: No head/neck/groin lymphadenopathy Psych:  No delerium/psychosis/paranoia HENT: Normocephalic, Mucus membranes moist.  No thrush Neck: Supple, No tracheal deviation Chest: No chest wall pain w good excursion CV:  Pulses intact.  Regular rhythm MS: Normal AROM mjr joints.  No obvious deformity  Abdomen: Soft.  Mildy distended.  Nontender.  No evidence of peritonitis.  No incarcerated hernias.  Ext:  No deformity.  No mjr edema.  No cyanosis Skin: No petechiae / purpura  Results:   Labs: Results for orders placed or performed during the hospital encounter of 12/28/18 (from the past 48 hour(s))  CBC     Status: None   Collection Time: 12/28/18  2:51 PM  Result Value Ref Range   WBC 7.7 4.0 - 10.5 K/uL   RBC 4.38 4.22 - 5.81 MIL/uL   Hemoglobin 13.5 13.0 - 17.0 g/dL   HCT 41.4 39.0 - 52.0 %   MCV 94.5 80.0 - 100.0 fL  MCH 30.8 26.0 - 34.0 pg   MCHC 32.6 30.0 - 36.0 g/dL   RDW 12.7 11.5 - 15.5 %   Platelets 268 150 - 400 K/uL   nRBC 0.0 0.0 - 0.2 %    Comment: Performed at [facility], [place] [address], [place], [place] [address]    Imaging / Studies: No results found.  Medications / Allergies: per chart  Antibiotics: Anti-infectives (From admission, onward)   Start     Dose/Rate Route Frequency Ordered Stop    12/29/18 2200  ceFAZolin (ANCEF) IVPB 2g/100 mL premix     2 g 200 mL/hr over 30 Minutes Intravenous Every 8 hours 12/29/18 1654 12/29/18 2148   12/29/18 1409  ceFAZolin (ANCEF) 2-4 GM/100ML-% IVPB    Note to Pharmacy:  Lavon Paganini   : cabinet override      12/29/18 1409 12/29/18 1410   12/29/18 0815  ceFAZolin (ANCEF) IVPB 2g/100 mL premix     2 g 200 mL/hr over 30 Minutes Intravenous On call to O.R. 12/29/18 0807 12/29/18 1440        Note: Portions of this report may have been transcribed using voice recognition software. Every effort was made to ensure accuracy; however, inadvertent computerized transcription errors may be present.   Any transcriptional errors that result from this process are unintentional.     [name], MD, FACS, MASCRS Gastrointestinal and Minimally Invasive Surgery    [address] [place], [place] [address] [phone] Main / Paging [phone] Fax

## 2018-12-30 NOTE — Discharge Instructions (Signed)
HERNIA REPAIR: POST OP INSTRUCTIONS ° °###################################################################### ° °EAT °Gradually transition to a high fiber diet with a fiber supplement over the next few weeks after discharge.  Start with a pureed / full liquid diet (see below) ° °WALK °Walk an hour a day.  Control your pain to do that.   ° °CONTROL PAIN °Control pain so that you can walk, sleep, tolerate sneezing/coughing, and go up/down stairs. ° °HAVE A BOWEL MOVEMENT DAILY °Keep your bowels regular to avoid problems.  OK to try a laxative to override constipation.  OK to use an antidairrheal to slow down diarrhea.  Call if not better after 2 tries ° °CALL IF YOU HAVE PROBLEMS/CONCERNS °Call if you are still struggling despite following these instructions. °Call if you have concerns not answered by these instructions ° °###################################################################### ° ° ° °1. DIET: Follow a light bland diet the first 24 hours after arrival home, such as soup, liquids, crackers, etc.  Be sure to include lots of fluids daily.  Advance to a low fat / high fiber diet over the next few days after surgery.  Avoid fast food or heavy meals the first week as your are more likely to get nauseated.   ° °2. Take your usually prescribed home medications unless otherwise directed. ° °3. PAIN CONTROL: °a. Pain is best controlled by a usual combination of three different methods TOGETHER: °i. Ice/Heat °ii. Over the counter pain medication °iii. Prescription pain medication °b. Most patients will experience some swelling and bruising around the hernia(s) such as the bellybutton, groins, or old incisions.  Ice packs or heating pads (30-60 minutes up to 6 times a day) will help. Use ice for the first few days to help decrease swelling and bruising, then switch to heat to help relax tight/sore spots and speed recovery.  Some people prefer to use ice alone, heat alone, alternating between ice & heat.  Experiment  to what works for you.  Swelling and bruising can take several weeks to resolve.   °c. It is helpful to take an over-the-counter pain medication regularly for the first few weeks.  Choose one of the following that works best for you: °i. Naproxen (Aleve, etc)  Two 220mg tabs twice a day °ii. Ibuprofen (Advil, etc) Three 200mg tabs four times a day (every meal & bedtime) °iii. Acetaminophen (Tylenol, etc) 325-650mg four times a day (every meal & bedtime) °d. A  prescription for pain medication should be given to you upon discharge.  Take your pain medication as prescribed.  °i. If you are having problems/concerns with the prescription medicine (does not control pain, nausea, vomiting, rash, itching, etc), please call us [phone] to see if we need to switch you to a different pain medicine that will work better for you and/or control your side effect better. °ii. If you need a refill on your pain medication, please contact your pharmacy.  They will contact our office to request authorization. Prescriptions will not be filled after 5 pm or on week-ends. ° °4. Avoid getting constipated.  Between the surgery and the pain medications, it is common to experience some constipation.  Increasing fluid intake and taking a fiber supplement (such as Metamucil, Citrucel, FiberCon, MiraLax, etc) 1-2 times a day regularly will usually help prevent this problem from occurring.  A mild laxative (prune juice, Milk of Magnesia, MiraLax, etc) should be taken according to package directions if there are no bowel movements after 48 hours.   ° °5. Wash / shower every   day.  You may shower over the dressings as they are waterproof.    6. Remove your waterproof bandages, skin tapes, and other bandages Monday 03 February.  He will have shoelace light wicks in your middle largest incision.  Pull those out as well.  You may replace a dressing/Band-Aid to cover the incision for comfort if you wish. You may leave the incisions open to air.   You may replace a dressing/Band-Aid to cover an incision for comfort if you wish.  Continue to shower over incision(s) after the dressing is off.  7. ACTIVITIES as tolerated:   a. You may resume regular (light) daily activities beginning the next day--such as daily self-care, walking, climbing stairs--gradually increasing activities as tolerated.  Control your pain so that you can walk an hour a day.  If you can walk 30 minutes without difficulty, it is safe to try more intense activity such as jogging, treadmill, bicycling, low-impact aerobics, swimming, etc. b. Save the most intensive and strenuous activity for last such as sit-ups, heavy lifting, contact sports, etc  Refrain from any heavy lifting or straining until you are off narcotics for pain control.   c. DO NOT PUSH THROUGH PAIN.  Let pain be your guide: If it hurts to do something, don't do it.  Pain is your body warning you to avoid that activity for another week until the pain goes down. d. You may drive when you are no longer taking prescription pain medication, you can comfortably wear a seatbelt, and you can safely maneuver your car and apply brakes. e. Dennis Bast may have sexual intercourse when it is comfortable.   8. FOLLOW UP in our office a. Please call [facility] at [phone] to set up an appointment to see your surgeon in the office for a follow-up appointment approximately 2-3 weeks after your surgery. b. Make sure that you call for this appointment the day you arrive home to insure a convenient appointment time.  9.  If you have disability of FMLA / Family leave forms, please bring the forms to the office for processing.  (do not give to your surgeon).  WHEN TO CALL us [phone]: 1. Poor pain control 2. Reactions / problems with new medications (rash/itching, nausea, etc)  3. Fever over 101.5 F (38.5 C) 4. Inability to urinate 5. Nausea and/or vomiting 6. Worsening swelling or bruising 7. Continued bleeding from  incision. 8. Increased pain, redness, or drainage from the incision   The clinic staff is available to answer your questions during regular business hours (8:30am-5pm).  Please dont hesitate to call and ask to speak to one of our nurses for clinical concerns.   If you have a medical emergency, go to the nearest emergency room or call 911.  A surgeon from [facility] is always on call at the hospitals in [facility], [place], [place], [place], [place]  [address] ?  [address] [address], [place],    [address] MAIN: [id number] ? TOLL FREE: [phone] ? FAX: [phone] [email or web address]    Hernia, Adult     A hernia is the bulging of an organ or tissue through a weak spot in the muscles of the abdomen (abdominal wall). Hernias develop most often near the belly button (navel) or the area where the leg meets the lower abdomen (groin). Common types of hernias include:  Incisional hernia. This type bulges through a scar from an abdominal surgery.  Umbilical hernia. This type  develops near the navel.  Inguinal hernia. This type develops in the groin or scrotum.  Femoral hernia. This type develops under the groin, in the upper thigh area.  Hiatal hernia. This type occurs when part of the stomach slides above the muscle that separates the abdomen from the chest (diaphragm). What are the causes? This condition may be caused by:  Heavy lifting.  Coughing over a long period of time.  Straining to have a bowel movement. Constipation can lead to straining.  An incision made during an abdominal surgery.  A physical problem that is present at birth (congenital defect).  Being overweight or obese.  Smoking.  Excess fluid in the abdomen.  Undescended testicles in males. What are the signs or symptoms? The main symptom is a skin-colored, rounded bulge in the area of the hernia. However, a bulge may not always be  present. It may grow bigger or be more visible when you cough or strain (such as when lifting something heavy). A hernia that can be pushed back into the area (is reducible) rarely causes pain. A hernia that cannot be pushed back into the area (is incarcerated) may lose its blood supply (become strangulated). A hernia that is incarcerated may cause:  Pain.  Fever.  Nausea and vomiting.  Swelling.  Constipation. How is this diagnosed? A hernia may be diagnosed based on:  Your symptoms and medical history.  A physical exam. Your health care provider may ask you to cough or move in certain ways to see if the hernia becomes visible.  Imaging tests, such as: ? X-rays. ? Ultrasound. ? CT scan. How is this treated? A hernia that is small and painless may not need to be treated. A hernia that is large or painful may be treated with surgery. Inguinal hernias may be treated with surgery to prevent incarceration or strangulation. Strangulated hernias are always treated with surgery because a lack of blood supply to the trapped organ or tissue can cause it to die. Surgery to treat a hernia involves pushing the bulge back into place and repairing the weak area of the muscle or abdominal wall. Follow these instructions at home: Activity  Avoid straining.  Do not lift anything that is heavier than 10 lb (4.5 kg), or the limit that you are told, until your health care provider says that it is safe.  When lifting heavy objects, lift with your leg muscles, not your back muscles. Preventing constipation  Take actions to prevent constipation. Constipation leads to straining with bowel movements, which can make a hernia worse or cause a hernia repair to break down. Your health care provider may recommend that you: ? Drink enough fluid to keep your urine pale yellow. ? Eat foods that are high in fiber, such as fresh fruits and vegetables, whole grains, and beans. ? Limit foods that are high in fat  and processed sugars, such as fried or sweet foods. ? Take an over-the-counter or prescription medicine for constipation. General instructions  When coughing, try to cough gently.  You may try to push the hernia back in place by very gently pressing on it while lying down. Do not try to force the bulge back in if it will not push in easily.  If you are overweight, work with your health care provider to lose weight safely.  Do not use any products that contain nicotine or tobacco, such as cigarettes and e-cigarettes. If you need help quitting, ask your health care provider.  If you are  scheduled for hernia repair, watch your hernia for any changes in shape, size, or color. Tell your health care provider about any changes or new symptoms.  Take over-the-counter and prescription medicines only as told by your health care provider.  Keep all follow-up visits as told by your health care provider. This is important. Contact a health care provider if:  You develop new pain, swelling, or redness around your hernia.  You have signs of constipation, such as: ? Fewer bowel movements in a week than normal. ? Difficulty having a bowel movement. ? Stools that are dry, hard, or larger than normal. Get help right away if:  You have a fever.  You have abdomen pain that gets worse.  You feel nauseous or you vomit.  You cannot push the hernia back in place by very gently pressing on it while lying down. Do not try to force the bulge back in if it will not push in easily.  The hernia: ? Changes in shape, size, or color. ? Feels hard or tender. These symptoms may represent a serious problem that is an emergency. Do not wait to see if the symptoms will go away. Get medical help right away. Call your local emergency services (911 in the [place] Summary  A hernia is the bulging of an organ or tissue through a weak spot in the muscles of the abdomen (abdominal wall).  The main symptom is a  skin-colored, rounded lump (bulge) in the hernia area. However, a bulge may not always be present. It may grow bigger or more visible when you cough or strain (such as when having a bowel movement).  A hernia that is small and painless may not need to be treated. A hernia that is large or painful may be treated with surgery.  Surgery to treat a hernia involves pushing the bulge back into place and repairing the weak part of the abdomen. This information is not intended to replace advice given to you by your health care provider. Make sure you discuss any questions you have with your health care provider. Document Released: 11/16/2005 Document Revised: 08/18/2017 Document Reviewed: 08/18/2017 Elsevier Interactive Patient Education  2019 [employer].

## 2018-12-30 NOTE — Plan of Care (Signed)
Patient lying in bed this morning. No complaints of significant pain at this time. Wants to ambulate, but states he wants to sit up in chair for a while first. Assisted up to chair. Will continue to monitor.

## 2018-12-30 NOTE — Discharge Summary (Signed)
Physician Discharge Summary    Patient ID: Matthew Hodges MRN: [id number] DOB/AGE: [birth year]  78 y.o.  Patient Care Team: Darcus Austin, MD (Inactive) as PCP - General (Family Medicine) Druscilla Brownie, MD (Dermatology) Carolan Clines, MD (Inactive) as Attending Physician (Urology) Michael Boston, MD as Consulting Physician (General Surgery)  Admit date: 12/29/2018  Discharge date: 12/30/2018  Hospital Stay = 0 days    Discharge Diagnoses:  Principal Problem:   Incarcerated incisional hernia s/p lap repair w mesh 12/29/2018 Active Problems:   Recurrent small bowel obstructions s/p robotic adhesiolysis 12/29/2018   Hypertension   Hyperlipidemia   Obesity (BMI 30-39.9)   1 Day Post-Op  12/29/2018  POST-OPERATIVE DIAGNOSIS:  incarcerated incisional hernia  PROCEDURE:    LAPAROSCOPIC LYSIS OF ADHESIONS X2.5 HOURS OPEN LYSIS OF ADHESIONS X1 HOUR LAPARPSCOPIC REPAIR OF INCARCERATED INCISIONAL HERNIAS WITH MESH  SURGEON:  [name], MD     Consults: None  Hospital Course:   Pleasant patient with history of numerous prior abdominal surgeries.  Started with perforated colon with Hartmann resection.  Takedown.  Hernia repair.  Stitch abscesses removal.  Recurrent incisional hernia.  Laparoscopic underlay repair.  Bowel obstruction requiring lyse adhesions.  Midline incisional hernia requiring underlay repair with mesh.  Inguinal hernias requiring repair.  Patient developed new hernia in right paramedian region.  He underwent laparoscopically adhesions and takedown and repair of the hernia on the day of admission.  His pain was minimal.  Mobilized the night of surgery and was tolerating liquids well.  He was able to be gradually advanced to a solid diet.  Pain and other symptoms were treated aggressively.    By the time of discharge, the patient was walking well the hallways, eating food, having flatus.  Pain was well-controlled on an oral medications.  Based on  meeting discharge criteria and continuing to recover, I felt it was safe for the patient to be discharged from the hospital to further recover with close followup. Postoperative recommendations were discussed in detail.  They are written as well.  Discharged Condition: good  Discharge Exam: Blood pressure 136/65, pulse 84, temperature 98.4 F (36.9 C), temperature source Oral, resp. rate 16, height 5' 6.5" (1.689 m), weight 91.2 kg, SpO2 92 %.  General: Pt awake/alert/oriented x4 in No acute distress Eyes: PERRL, normal EOM.  Sclera clear.  No icterus Neuro: CN II-XII intact w/o focal sensory/motor deficits. Lymph: No head/neck/groin lymphadenopathy Psych:  No delerium/psychosis/paranoia HENT: Normocephalic, Mucus membranes moist.  No thrush Neck: Supple, No tracheal deviation Chest: No chest wall pain w good excursion CV:  Pulses intact.  Regular rhythm MS: Normal AROM mjr joints.  No obvious deformity Abdomen: Soft.  Mildly distended.  Mildly tender at incisions only.  No evidence of peritonitis.  No incarcerated hernias. Ext:  SCDs BLE.  No mjr edema.  No cyanosis Skin: No petechiae / purpura   Disposition:   Follow-up Information    Michael Boston, MD. Schedule an appointment as soon as possible for a visit in 3 weeks.   Specialty:  General Surgery Why:  To follow up after your operation, To follow up after your hospital stay Contact information: [place] [address] [phone]           Discharge disposition: 01-Home or Self Care       Discharge Instructions    Call MD for:   Complete by:  As directed    FEVER > 101.5 F  (temperatures < 101.5  F are not significant)   Call MD for:  extreme fatigue   Complete by:  As directed    Call MD for:  persistant dizziness or light-headedness   Complete by:  As directed    Call MD for:  persistant nausea and vomiting   Complete by:  As directed    Call MD for:  redness, tenderness, or  signs of infection (pain, swelling, redness, odor or green/yellow discharge around incision site)   Complete by:  As directed    Call MD for:  severe uncontrolled pain   Complete by:  As directed    Diet - low sodium heart healthy   Complete by:  As directed    Start with a bland diet such as soups, liquids, starchy foods, low fat foods, etc. the first few days at home. Gradually advance to a solid, low-fat, high fiber diet by the end of the first week at home.   Add a fiber supplement to your diet (Metamucil, etc) If you feel full, bloated, or constipated, stay on a full liquid or pureed/blenderized diet for a few days until you feel better and are no longer constipated.   Discharge instructions   Complete by:  As directed    See Discharge Instructions If you are not getting better after two weeks or are noticing you are getting worse, contact our office [phone] for further advice.  We may need to adjust your medications, re-evaluate you in the office, send you to the emergency room, or see what other things we can do to help. The clinic staff is available to answer your questions during regular business hours (8:30am-5pm).  Please don't hesitate to call and ask to speak to one of our nurses for clinical concerns.    A surgeon from [facility] is always on call at the hospitals 24 hours/day If you have a medical emergency, go to the nearest emergency room or call 911.   Discharge wound care:   Complete by:  As directed    It is good for closed incisions and even open wounds to be washed every day.  Shower every day.  Short baths are fine.  Wash the incisions and wounds clean with soap & water.     You may leave closed incisions open to air if it is dry.   You may cover the incision with clean gauze & replace it after your daily shower for comfort.   If you have skin tapes (Steristrips) or skin glue (Dermabond) on your incision, leave them in place.  They will fall off on  their own like a scab.  You may trim any edges that curl up with clean scissors.    If you have skin staples, set up an appointment for them to be removed in the office in 10 days after surgery.  If you have a drain, wash around the skin exit site with soap & water and place a new dressing of gauze or band aid around the skin every day.  Keep the drain site clean & dry.   Driving Restrictions   Complete by:  As directed    You may drive when: - you are no longer taking narcotic prescription pain medication - you can comfortably wear a seatbelt - you can safely make sudden turns/stops without pain.   Increase activity slowly   Complete by:  As directed    Start light daily activities --- self-care, walking, climbing stairs- beginning the day after surgery.  Gradually increase activities as tolerated.  Control your pain to be active.  Stop when you are tired.  Ideally, walk several times a day, eventually an hour a day.   Most people are back to most day-to-day activities in a few weeks.  It takes 4-6 weeks to get back to unrestricted, intense activity. If you can walk 30 minutes without difficulty, it is safe to try more intense activity such as jogging, treadmill, bicycling, low-impact aerobics, swimming, etc. Save the most intensive and strenuous activity for last (Usually 4-8 weeks after surgery) such as sit-ups, heavy lifting, contact sports, etc.  Refrain from any intense heavy lifting or straining until you are off narcotics for pain control.  You will have off days, but things should improve week-by-week. DO NOT PUSH THROUGH PAIN.  Let pain be your guide: If it hurts to do something, don't do it.   Lifting restrictions   Complete by:  As directed    If you can walk 30 minutes without difficulty, it is safe to try more intense activity such as jogging, treadmill, bicycling, low-impact aerobics, swimming, etc. Save the most intensive and strenuous activity for last (Usually 4-8 weeks after  surgery) such as sit-ups, heavy lifting, contact sports, etc.   Refrain from any intense heavy lifting or straining until you are off narcotics for pain control.  You will have off days, but things should improve week-by-week. DO NOT PUSH THROUGH PAIN.  Let pain be your guide: If it hurts to do something, don't do it.  Pain is your body warning you to avoid that activity for another week until the pain goes down.   May shower / Bathe   Complete by:  As directed    May walk up steps   Complete by:  As directed    Sexual Activity Restrictions   Complete by:  As directed    You may have sexual intercourse when it is comfortable. If it hurts to do something, stop.      Allergies as of 12/30/2018      Reactions   Latex Rash   Prolong contact       Medication List    TAKE these medications   aspirin EC 81 MG tablet Take 81 mg by mouth every morning.   atorvastatin 40 MG tablet Commonly known as:  LIPITOR Take 20 mg by mouth at bedtime.   Co Q-10 300 MG Caps Take 300 mg by mouth daily.   docusate sodium 100 MG capsule Commonly known as:  COLACE Take 100 mg by mouth daily.   Fish Oil 1200 MG Caps Take 1,200 mg by mouth daily.   MEGA MULTIVITAMIN FOR MEN PO Take 1 tablet by mouth every morning.   methocarbamol 750 MG tablet Commonly known as:  ROBAXIN Take 1 tablet (750 mg total) by mouth 4 (four) times daily as needed (use for muscle cramps/pain).   mirabegron ER 25 MG Tb24 tablet Commonly known as:  MYRBETRIQ Take 25 mg by mouth every morning.   traMADol 50 MG tablet Commonly known as:  ULTRAM Take 1-2 tablets (50-100 mg total) by mouth every 6 (six) hours as needed for moderate pain or severe pain.   Vitamin D3 10 MCG (400 UNIT) tablet Take 400 Units by mouth every evening.            Discharge Care Instructions  (From admission, onward)         Start     Ordered   12/29/18 0000  Discharge wound care:    Comments:  It is good for closed incisions and even  open wounds to be washed every day.  Shower every day.  Short baths are fine.  Wash the incisions and wounds clean with soap & water.     You may leave closed incisions open to air if it is dry.   You may cover the incision with clean gauze & replace it after your daily shower for comfort.   If you have skin tapes (Steristrips) or skin glue (Dermabond) on your incision, leave them in place.  They will fall off on their own like a scab.  You may trim any edges that curl up with clean scissors.    If you have skin staples, set up an appointment for them to be removed in the office in 10 days after surgery.  If you have a drain, wash around the skin exit site with soap & water and place a new dressing of gauze or band aid around the skin every day.  Keep the drain site clean & dry.   12/29/18 0941          Significant Diagnostic Studies:  Results for orders placed or performed during the hospital encounter of 12/28/18 (from the past 72 hour(s))  CBC     Status: None   Collection Time: 12/28/18  2:51 PM  Result Value Ref Range   WBC 7.7 4.0 - 10.5 K/uL   RBC 4.38 4.22 - 5.81 MIL/uL   Hemoglobin 13.5 13.0 - 17.0 g/dL   HCT 41.4 39.0 - 52.0 %   MCV 94.5 80.0 - 100.0 fL   MCH 30.8 26.0 - 34.0 pg   MCHC 32.6 30.0 - 36.0 g/dL   RDW 12.7 11.5 - 15.5 %   Platelets 268 150 - 400 K/uL   nRBC 0.0 0.0 - 0.2 %    Comment: Performed at [facility], [place] [address], [place], [place] [address]    No results found.  Past Medical History:  Diagnosis Date  . Aortic atherosclerosis ([place]) 11/10/2017   noted on CT abd/pelvis  . Aortic stenosis 2012  . Basal cell carcinoma    "sqattered"  . BBB (bundle branch block) 04/17/2015   Right   . Bilateral inguinal hernia 11/10/2017   possible small, noted on CT abd/pelvis  . Bilateral inguinal hernia (BIH) s/p lap repair w mesh 12/24/2017 12/24/2017  . Cerebral artery disease   . Claudication of both lower extremities ([place])   .  Diverticular disease   . Frequent urination   . Hearing impaired    bilateral hearing aids  . Hearing loss   . Heart murmur    "after surgeries before"  . 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 Hyperlipidemia   . Hypertension    hx. of . no meds since 2004  . Left femoral hernia s/p lap repair w mesh 12/24/2017 12/24/2017  . Left obturator hernia s/p lap repair w mesh 12/24/2017 12/24/2017  . Prostate cancer [facility])    treated with radiation  . Recurrent ventral incisional hernia s/p lap re-repair w mesh May 2016 04/23/2015  . Right acoustic neuroma ([place])   . SBO (small bowel obstruction) ([place]) 03/2016   last 04-21-16 admission([facility])"multiple occurrences"  . Squamous carcinoma    "sqattered"Scalp 1 polyethelene skin prostesis left frontal scalp area-removable.  . Stroke ([place]) 1979   no deficits  . Testicular cyst    history of right  .  Wears dentures    upper and lower  . Wears glasses   . Wears hearing aid in both ears     Past Surgical History:  Procedure Laterality Date  . COLECTOMY WITH COLOSTOMY CREATION/HARTMANN PROCEDURE  01/1999   Hartmans procedure with colostomy  . COLONOSCOPY    . COLOSTOMY TAKEDOWN  08/1999  . INCISIONAL HERNIA REPAIR  2001   open w mesh  . INGUINAL HERNIA REPAIR N/A 12/24/2017   Procedure: LAPAROSCOPIC  BILATERAL INGUINAL HERNIA REPAIR WITH MESH, LYSIS OF ADHESIONS;  Surgeon: Michael Boston, MD;  Location: [facility];  Service: General;  Laterality: N/A;  . LAPAROSCOPIC INCISIONAL / UMBILICAL / [place]  2008   with mesh.  LLQ old colostomy site  . LAPAROSCOPIC LYSIS OF ADHESIONS N/A 06/25/2016   Procedure: LAPAROSCOPIC LYSIS OF ADHESIONS;  Surgeon: Michael Boston, MD;  Location: [facility];  Service: General;  Laterality: N/A;  . LYSIS OF ADHESION N/A 04/23/2015   Procedure: LYSIS OF ADHESION;  Surgeon: Michael Boston, MD;  Location: [facility];  Service: General;  Laterality: N/A;  . MOHS SURGERY  02/2017   . PROSTATE BIOPSY  ~ 2002  . REMOVE SUTURE UNDER ANESTHESIA  2011   stitch abscesses LLQ & midline  . SMALL BOWEL REPAIR  03/1999   postop abscesses with fistula.  Abscesses drained.  Fistula repaired.  . TESTICLE SURGERY Right 1998  . VENTRAL HERNIA REPAIR N/A 04/23/2015   Procedure: LAPAROSCOPIC VENTRAL WALL HERNIA REPAIR AND LAP LYSIS OF ADHESIONS;  Surgeon: Michael Boston, MD;  Location: [facility];  Service: General;  Laterality: N/A;  With MESH    Social History   Socioeconomic History  . Marital status: Married    Spouse name: Not on file  . Number of children: Not on file  . Years of education: Not on file  . Highest education level: Not on file  Occupational History  . Not on file  Social Needs  . Financial resource strain: Not on file  . Food insecurity:    Worry: Not on file    Inability: Not on file  . Transportation needs:    Medical: Not on file    Non-medical: Not on file  Tobacco Use  . Smoking status: Former Smoker    Packs/day: 2.00    Years: 47.00    Pack years: 94.00    Types: Cigarettes    Last attempt to quit: 11/30/2004    Years since quitting: 14.0  . Smokeless tobacco: Never Used  Substance and Sexual Activity  . Alcohol use: No    Frequency: Never    Comment: Quit-recovering alcoholic sober since 9767  . Drug use: No  . Sexual activity: Not Currently  Lifestyle  . Physical activity:    Days per week: Not on file    Minutes per session: Not on file  . Stress: Not on file  Relationships  . Social connections:    Talks on phone: Not on file    Gets together: Not on file    Attends religious service: Not on file    Active member of club or organization: Not on file    Attends meetings of clubs or organizations: Not on file    Relationship status: Not on file  . Intimate partner violence:    Fear of current or ex partner: Not on file    Emotionally abused: Not on file    Physically abused: Not on file    Forced sexual activity: Not  on file   Other Topics Concern  . Not on file  Social History Narrative  . Not on file    History reviewed. No pertinent family history.  Current Facility-Administered Medications  Medication Dose Route Frequency Provider Last Rate Last Dose  . 0.9 %  sodium chloride infusion   Intravenous Continuous Michael Boston, MD 50 mL/hr at 12/29/18 1930    . 0.9 %  sodium chloride infusion  250 mL Intravenous PRN Michael Boston, MD      . 0.9 %  sodium chloride infusion  250 mL Intravenous PRN Michael Boston, MD      . acetaminophen (TYLENOL) tablet 1,000 mg  1,000 mg Oral Tor Netters, MD   1,000 mg at 12/29/18 1906  . alum & mag hydroxide-simeth (MAALOX/MYLANTA) 200-200-20 MG/5ML suspension 30 mL  30 mL Oral Q6H PRN Michael Boston, MD      . aspirin EC tablet 81 mg  81 mg Oral q morning - 10a Michael Boston, MD      . atorvastatin (LIPITOR) tablet 20 mg  20 mg Oral Ardeen Fillers, MD   20 mg at 12/29/18 2117  . bisacodyl (DULCOLAX) suppository 10 mg  10 mg Rectal Daily PRN Michael Boston, MD      . cholecalciferol (VITAMIN D3) tablet 400 Units  400 Units Oral QPM Michael Boston, MD      . diphenhydrAMINE (BENADRYL) 12.5 MG/5ML elixir 12.5 mg  12.5 mg Oral Q6H PRN Michael Boston, MD   12.5 mg at 12/30/18 0122   Or  . diphenhydrAMINE (BENADRYL) injection 12.5 mg  12.5 mg Intravenous Q6H PRN Michael Boston, MD      . docusate sodium (COLACE) capsule 100 mg  100 mg Oral Daily Michael Boston, MD      . enalaprilat (VASOTEC) injection 0.625-1.25 mg  0.625-1.25 mg Intravenous Q6H PRN Michael Boston, MD      . enoxaparin (LOVENOX) injection 40 mg  40 mg Subcutaneous Q24H Michael Boston, MD      . gabapentin (NEURONTIN) capsule 300 mg  300 mg Oral BID Michael Boston, MD   300 mg at 12/29/18 2117  . guaiFENesin-dextromethorphan (ROBITUSSIN DM) 100-10 MG/5ML syrup 10 mL  10 mL Oral Q4H PRN Michael Boston, MD      . hydrALAZINE (APRESOLINE) injection 5-20 mg  5-20 mg Intravenous Q4H PRN Michael Boston, MD      .  hydrocortisone (ANUSOL-HC) 2.5 % rectal cream 1 application  1 application Topical QID PRN Michael Boston, MD      . hydrocortisone cream 1 % 1 application  1 application Topical TID PRN Michael Boston, MD      . HYDROmorphone (DILAUDID) injection 0.5-2 mg  0.5-2 mg Intravenous Q2H PRN Michael Boston, MD   1 mg at 12/29/18 2015  . lactated ringers infusion 1,000 mL  1,000 mL Intravenous Q8H PRN Michael Boston, MD      . lip balm (CARMEX) ointment 1 application  1 application Topical BID Michael Boston, MD   1 application at 26/71/24 2117  . menthol-cetylpyridinium (CEPACOL) lozenge 3 mg  1 lozenge Oral PRN Michael Boston, MD      . methocarbamol (ROBAXIN) tablet 750 mg  750 mg Oral Q6H PRN Michael Boston, MD      . metoprolol tartrate (LOPRESSOR) injection 5 mg  5 mg Intravenous Q6H PRN Michael Boston, MD      . mirabegron ER (MYRBETRIQ) tablet 25 mg  25 mg Oral q morning - Lorenza Burton, MD      .  ondansetron (ZOFRAN-ODT) disintegrating tablet 4 mg  4 mg Oral Q6H PRN Michael Boston, MD       Or  . ondansetron (ZOFRAN) injection 4 mg  4 mg Intravenous Q6H PRN Michael Boston, MD      . phenol (CHLORASEPTIC) mouth spray 1-2 spray  1-2 spray Mouth/Throat PRN Michael Boston, MD      . polyethylene glycol (MIRALAX / GLYCOLAX) packet 17 g  17 g Oral Daily PRN Michael Boston, MD      . polyethylene glycol (MIRALAX / GLYCOLAX) packet 17 g  17 g Oral BID Michael Boston, MD      . prochlorperazine (COMPAZINE) tablet 10 mg  10 mg Oral Q6H PRN Michael Boston, MD       Or  . prochlorperazine (COMPAZINE) injection 5-10 mg  5-10 mg Intravenous Q6H PRN Michael Boston, MD      . simethicone (MYLICON) chewable tablet 40 mg  40 mg Oral Q6H PRN Michael Boston, MD      . sodium chloride flush (NS) 0.9 % injection 3 mL  3 mL Intravenous Gorden Harms, MD      . sodium chloride flush (NS) 0.9 % injection 3 mL  3 mL Intravenous PRN Michael Boston, MD      . sodium chloride flush (NS) 0.9 % injection 3 mL  3 mL Intravenous Gorden Harms, MD      . sodium chloride flush (NS) 0.9 % injection 3 mL  3 mL Intravenous PRN Michael Boston, MD      . traMADol Veatrice Bourbon) tablet 50-100 mg  50-100 mg Oral Q6H PRN Michael Boston, MD   100 mg at 12/30/18 0127   Facility-Administered Medications Ordered in Other Encounters  Medication Dose Route Frequency Provider Last Rate Last Dose  . bupivacaine liposome (EXPAREL) 1.3 % injection 266 mg  20 mL Infiltration Once Michael Boston, MD         Allergies  Allergen Reactions  . Latex Rash    Prolong contact     Signed: Morton Peters, MD, FACS, MASCRS Gastrointestinal and Minimally Invasive Surgery    [address] [place], [place] [address] [phone] Main / Paging [phone] Fax   12/30/2018, 7:42 AM

## 2019-03-01 DEATH — deceased

## 2019-11-07 ENCOUNTER — Other Ambulatory Visit: Payer: Self-pay

## 2019-11-07 DIAGNOSIS — Z20822 Contact with and (suspected) exposure to covid-19: Secondary | ICD-10-CM

## 2019-11-09 LAB — NOVEL CORONAVIRUS, NAA: SARS-CoV-2, NAA: NOT DETECTED

## 2019-11-17 ENCOUNTER — Ambulatory Visit
Admission: RE | Admit: 2019-11-17 | Discharge: 2019-11-17 | Disposition: A | Discharge status: Home / Self Care | Payer: Medicare Other | Source: Ambulatory Visit | Attending: Family Medicine | Admitting: Family Medicine

## 2019-11-17 ENCOUNTER — Other Ambulatory Visit: Payer: Self-pay | Admitting: Family Medicine

## 2019-11-17 ENCOUNTER — Other Ambulatory Visit: Payer: Self-pay

## 2019-11-17 DIAGNOSIS — R0602 Shortness of breath: Secondary | ICD-10-CM

## 2019-11-22 ENCOUNTER — Encounter: Payer: Self-pay | Admitting: Cardiology

## 2019-11-22 ENCOUNTER — Other Ambulatory Visit: Payer: Self-pay

## 2019-11-22 ENCOUNTER — Ambulatory Visit: Payer: Medicare Other | Admitting: Cardiology

## 2019-11-22 VITALS — BP 144/80 | HR 93 | Ht 68.0 in | Wt 206.9 lb

## 2019-11-22 DIAGNOSIS — E7800 Pure hypercholesterolemia, unspecified: Secondary | ICD-10-CM | POA: Diagnosis not present

## 2019-11-22 DIAGNOSIS — R0989 Other specified symptoms and signs involving the circulatory and respiratory systems: Secondary | ICD-10-CM

## 2019-11-22 DIAGNOSIS — I2510 Atherosclerotic heart disease of native coronary artery without angina pectoris: Secondary | ICD-10-CM | POA: Diagnosis not present

## 2019-11-22 DIAGNOSIS — I10 Essential (primary) hypertension: Secondary | ICD-10-CM | POA: Diagnosis not present

## 2019-11-22 DIAGNOSIS — R0609 Other forms of dyspnea: Secondary | ICD-10-CM

## 2019-11-22 DIAGNOSIS — Q251 Coarctation of aorta: Secondary | ICD-10-CM | POA: Diagnosis not present

## 2019-11-22 DIAGNOSIS — I739 Peripheral vascular disease, unspecified: Secondary | ICD-10-CM

## 2019-11-22 MED ORDER — ATORVASTATIN CALCIUM 40 MG PO TABS: 20.0000 mg | ORAL_TABLET | Freq: Every day | ORAL | 1 refills | Status: DC

## 2019-11-22 MED ORDER — ASPIRIN EC 81 MG PO TBEC: 81.0000 mg | DELAYED_RELEASE_TABLET | Freq: Every day | ORAL | 3 refills | Status: AC

## 2019-11-22 MED ORDER — METOPROLOL SUCCINATE ER 50 MG PO TB24: 50.0000 mg | ORAL_TABLET | Freq: Every day | ORAL | 2 refills | Status: DC

## 2019-11-22 NOTE — Progress Notes (Signed)
 Primary Physician/Referring:  Morrow, Aaron, MD  Patient ID: Matthew Hodges, male    DOB: 08/13/1941, 78 y.o.   MRN: [id number]  Chief Complaint  Patient presents with  . Shortness of Breath  . New Patient (Initial Visit)   HPI:    Matthew Hodges  is a 78 y.o. Caucasian male with hypertension, hyperlipidemia, abdominal aortic stenosis with bilateral hip claudication, prior heavy Alcohol use, referred to me on an urgent basis for evaluation of worsening dyspnea over the past one month.  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  This is new to him although he has had mild chronic dyspnea and had remained stable.  His next-door neighbor had similar symptoms and ended up needing CABG and he got concerned as his symptoms very very similar.  He also states that over the past one year or so he has gained weight due to the fact that he has not been able to walk much due to severe bilateral hip pain and he has to frequently stop and rest.  He denies chest pain, palpitations, dizziness, syncope, TIA-like symptoms.  Past Medical History:  Diagnosis Date  . Aortic atherosclerosis (HCC) 11/10/2017   noted on CT abd/pelvis  . Aortic stenosis 2012  . Basal cell carcinoma    "sqattered"  . BBB (bundle branch block) 04/17/2015   Right   . Bilateral inguinal hernia 11/10/2017   possible small, noted on CT abd/pelvis  . Bilateral inguinal hernia (BIH) s/p lap repair w mesh 12/24/2017 12/24/2017  . Cerebral artery disease   . Claudication of both lower extremities (HCC)   . Diverticular disease   . Frequent urination   . Hearing impaired    bilateral hearing aids  . Hearing loss   . Heart murmur    "after surgeries before"  . 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 Hyperlipidemia   . Hypertension    hx. of . no meds since 2004  . Left femoral  hernia s/p lap repair w mesh 12/24/2017 12/24/2017  . Left obturator hernia s/p lap repair w mesh 12/24/2017 12/24/2017  . Prostate cancer (HCC)    treated with radiation  . Recurrent ventral incisional hernia s/p lap re-repair w mesh May 2016 04/23/2015  . Right acoustic neuroma (HCC)   . SBO (small bowel obstruction) (HCC) 03/2016   last 04-21-16 admission([facility])"multiple occurrences"  . Squamous carcinoma    "sqattered"Scalp 1 polyethelene skin prostesis left frontal scalp area-removable.  . Stroke (HCC) 1979   no deficits  . Testicular cyst    history of right  . Wears dentures    upper and lower  . Wears glasses   . Wears hearing aid in both ears    Past Surgical History:  Procedure Laterality Date  . COLECTOMY WITH COLOSTOMY CREATION/HARTMANN PROCEDURE  01/1999   Hartmans procedure with colostomy  . COLONOSCOPY    . COLOSTOMY TAKEDOWN  08/1999  . INCISIONAL HERNIA REPAIR  2001   open w mesh  . INGUINAL HERNIA REPAIR N/A 12/24/2017   Procedure: LAPAROSCOPIC  BILATERAL INGUINAL HERNIA REPAIR WITH MESH, LYSIS OF ADHESIONS;  Surgeon: Gross, Steven, MD;  Location: [facility];  Service: General;  Laterality: N/A;  . LAPAROSCOPIC INCISIONAL / UMBILICAL / VENTRAL HERNIA REPAIR  2008   with mesh.  LLQ old colostomy site  .   LAPAROSCOPIC LYSIS OF ADHESIONS N/A 06/25/2016   Procedure: LAPAROSCOPIC LYSIS OF ADHESIONS;  Surgeon: Steven Gross, MD;  Location: [facility];  Service: General;  Laterality: N/A;  . LYSIS OF ADHESION N/A 04/23/2015   Procedure: LYSIS OF ADHESION;  Surgeon: Steven Gross, MD;  Location: [facility];  Service: General;  Laterality: N/A;  . MOHS SURGERY  02/2017  . PROSTATE BIOPSY  ~ 2002  . REMOVE SUTURE UNDER ANESTHESIA  2011   stitch abscesses LLQ & midline  . SMALL BOWEL REPAIR  03/1999   postop abscesses with fistula.  Abscesses drained.  Fistula repaired.  . TESTICLE SURGERY Right 1998  . VENTRAL HERNIA REPAIR N/A 04/23/2015   Procedure: LAPAROSCOPIC VENTRAL  WALL HERNIA REPAIR AND LAP LYSIS OF ADHESIONS;  Surgeon: Steven Gross, MD;  Location: [facility];  Service: General;  Laterality: N/A;  With MESH  . VENTRAL HERNIA REPAIR N/A 12/29/2018   Procedure: LAPAROSCOPIC LYSIS OF ADHESIONS X2.5 HOURS, OPEN LYSIS OF ADHESIONS X1 HOUR, laparoscopic REPAIR OF INCARCERATED INCISIONAL HERNIA WITH MESH;  Surgeon: Gross, Steven, MD;  Location: [facility];  Service: General;  Laterality: N/A;   Social History   Socioeconomic History  . Marital status: Married    Spouse name: Not on file  . Number of children: 2  . Years of education: Not on file  . Highest education level: Not on file  Occupational History  . Not on file  Tobacco Use  . Smoking status: Former Smoker    Packs/day: 2.00    Years: 47.00    Pack years: 94.00    Types: Cigarettes    Quit date: 11/30/2004    Years since quitting: 14.9  . Smokeless tobacco: Never Used  Substance and Sexual Activity  . Alcohol use: No    Comment: Quit-recovering alcoholic sober since 2004  . Drug use: No  . Sexual activity: Not Currently  Other Topics Concern  . Not on file  Social History Narrative  . Not on file   Social Determinants of Health   Financial Resource Strain:   . Difficulty of Paying Living Expenses: Not on file  Food Insecurity:   . Worried About Running Out of Food in the Last Year: Not on file  . Ran Out of Food in the Last Year: Not on file  Transportation Needs:   . Lack of Transportation (Medical): Not on file  . Lack of Transportation (Non-Medical): Not on file  Physical Activity:   . Days of Exercise per Week: Not on file  . Minutes of Exercise per Session: Not on file  Stress:   . Feeling of Stress : Not on file  Social Connections:   . Frequency of Communication with Friends and Family: Not on file  . Frequency of Social Gatherings with Friends and Family: Not on file  . Attends Religious Services: Not on file  . Active Member of Clubs or Organizations: Not on file  .  Attends Club or Organization Meetings: Not on file  . Marital Status: Not on file  Intimate Partner Violence:   . Fear of Current or Ex-Partner: Not on file  . Emotionally Abused: Not on file  . Physically Abused: Not on file  . Sexually Abused: Not on file   ROS  Review of Systems  Constitution: Negative for chills, decreased appetite, malaise/fatigue and weight gain.  Cardiovascular: Positive for claudication and dyspnea on exertion. Negative for chest pain, leg swelling and syncope.  Endocrine: Negative for cold intolerance.  Hematologic/Lymphatic: Does not   bruise/bleed easily.  Musculoskeletal: Negative for joint swelling.  Gastrointestinal: Negative for abdominal pain, anorexia, change in bowel habit, hematochezia and melena.  Neurological: Negative for headaches and light-headedness.  Psychiatric/Behavioral: Negative for depression and substance abuse.  All other systems reviewed and are negative.  Objective  Blood pressure (!) 144/80, pulse 93, height 5' 8" (1.727 m), weight 206 lb 14.4 oz (93.8 kg), SpO2 97 %.  Vitals with BMI 11/22/2019 11/22/2019 12/30/2018  Height - 5' 8" -  Weight - 206 lbs 14 oz -  BMI - 60.63 -  Systolic 016 010 932  Diastolic 80 77 69  Pulse - 93 86     Physical Exam  Constitutional: He appears well-developed and well-nourished.  HENT:  Head: Atraumatic.  Eyes: Conjunctivae are normal.  Neck: No JVD present. No thyromegaly present.  Cardiovascular: Normal rate, regular rhythm, normal heart sounds and intact distal pulses. Exam reveals no gallop.  No murmur heard. Pulses:      Carotid pulses are on the left side with bruit.      Femoral pulses are 2+ on the right side and 2+ on the left side.      Dorsalis pedis pulses are 2+ on the right side and 2+ on the left side.       Posterior tibial pulses are 1+ on the right side and 1+ on the left side.  No leg edema, no JVD.  Pulmonary/Chest: Effort normal and breath sounds normal.  Abdominal:  Soft. Bowel sounds are normal.  Musculoskeletal:        General: Normal range of motion.     Cervical back: Neck supple.  Neurological: He is alert.  Skin: Skin is warm and dry.  Psychiatric: He has a normal mood and affect.   Laboratory examination:   No results for input(s): NA, K, CL, CO2, GLUCOSE, BUN, CREATININE, CALCIUM, GFRNONAA, GFRAA in the last 8760 hours. CrCl cannot be calculated (Patient's most recent lab result is older than the maximum 21 days allowed.).  CMP Latest Ref Rng & Units 12/24/2017 06/26/2016 06/22/2016  Glucose 65 - 99 mg/dL 126(H) 103(H) 115(H)  BUN 6 - 20 mg/dL - 14 19  Creatinine 0.61 - 1.24 mg/dL - 1.10 1.15  Sodium 135 - 145 mmol/L 141 139 139  Potassium 3.5 - 5.1 mmol/L 4.1 4.1 3.9  Chloride 101 - 111 mmol/L - 104 107  CO2 22 - 32 mmol/L - 29 25  Calcium 8.9 - 10.3 mg/dL - 8.6(L) 8.8(L)  Total Protein 6.5 - 8.1 g/dL - - -  Total Bilirubin 0.3 - 1.2 mg/dL - - -  Alkaline Phos 38 - 126 U/L - - -  AST 15 - 41 U/L - - -  ALT 17 - 63 U/L - - -   CBC Latest Ref Rng & Units 12/28/2018 12/24/2017 06/26/2016  WBC 4.0 - 10.5 K/uL 7.7 - 6.9  Hemoglobin 13.0 - 17.0 g/dL 13.5 13.6 11.7(L)  Hematocrit 39.0 - 52.0 % 41.4 40.0 34.5(L)  Platelets 150 - 400 K/uL 268 - 188   Lipid Panel  No results found for: CHOL, TRIG, HDL, CHOLHDL, VLDL, LDLCALC, LDLDIRECT HEMOGLOBIN A1C Lab Results  Component Value Date   HGBA1C 5.9 (H) 02/04/2016   MPG 123 02/04/2016   TSH No results for input(s): TSH in the last 8760 hours.  Labs 11/10/2018: Serum glucose 93 mg, BUN 18, EGFR >60 mL, CMP normal.  Total cholesterol 156, triglycerides 171, HDL 36, LDL 85.  PSA normal.  Medications and allergies  Allergies  Allergen Reactions  . Latex Rash    Prolong contact      Current Outpatient Medications  Medication Instructions  . aspirin EC 81 mg, Oral, Daily  . atorvastatin (LIPITOR) 20 mg, Oral, Daily at bedtime  . Co Q-10 300 mg, Oral, Daily  . docusate sodium (COLACE)  100 mg, Oral, Daily  . Fish Oil 1,200 mg, Oral, Daily  . Melatonin 5 mg, Oral, Daily  . methocarbamol (ROBAXIN) 750 mg, Oral, 4 times daily PRN  . metoprolol succinate (TOPROL-XL) 50 mg, Oral, Daily, Take with or immediately following a meal.  . mirabegron ER (MYRBETRIQ) 25 mg, Oral,  Every morning - 10a  . Multiple Vitamins-Minerals (MEGA MULTIVITAMIN FOR MEN PO) 1 tablet, Oral,  Every morning - 10a  . traMADol (ULTRAM) 50-100 mg, Oral, Every 6 hours PRN  . Vitamin D3 400 Units, Oral, Every evening    Radiology:  No results found.  Cardiac Studies:   Echocardiogram 10/15/2016:  Normal LV systolic function, EF 60-65%.  Mild LVH, grade 1 diastolic dysfunction.  Bilateral lower extremity segmental pressures 02/02/2017: 1. Normal bilateral resting ankle-brachial indices, arterial waveforms, pulse volume recordings and segmental pressures. 2. However, correlation with prior CT imaging demonstrates bulky calcified atherosclerotic plaque in the infrarenal abdominal aorta with a coral-reef configuration resulting in significant narrowing of the luminal diameter consistent with inflow disease. This could explain the patient's clinical symptoms of buttock/hip and upper thigh claudication.  Assessment     ICD-10-CM   1. DOE (dyspnea on exertion)  R06.00 EKG 12-Lead    PCV ECHOCARDIOGRAM COMPLETE    PCV MYOCARDIAL PERFUSION WITH LEXISCAN  2. Coronary artery calcification seen on CAT scan 2019  I25.10   3. Hypercholesteremia  E78.00 atorvastatin (LIPITOR) 40 MG tablet  4. Primary hypertension  I10 metoprolol succinate (TOPROL-XL) 50 MG 24 hr tablet  5. Abdominal aortic stenosis CTA abd 02/04/2016  Q25.1   6. Claudication of gluteal region (HCC)  I73.9 aspirin EC 81 MG tablet    PCV LOWER ARTERIAL (BILATERAL)  7. Left carotid bruit  R09.89 PCV CAROTID DUPLEX (BILATERAL)    EKG 11/22/2019: Normal sinus rhythm at rate of 82 bpm, left atrial enlargement, left axis deviation, left anterior  fascicular block.  Incomplete right bundle branch block.  Poor R wave progression, cannot exclude anteroseptal infarct old.  Nonspecific T abnormality.    Recommendations:   Meds ordered this encounter  Medications  . atorvastatin (LIPITOR) 40 MG tablet    Sig: Take 0.5 tablets (20 mg total) by mouth at bedtime.    Dispense:  90 tablet    Refill:  1  . metoprolol succinate (TOPROL-XL) 50 MG 24 hr tablet    Sig: Take 1 tablet (50 mg total) by mouth daily. Take with or immediately following a meal.    Dispense:  30 tablet    Refill:  2  . aspirin EC 81 MG tablet    Sig: Take 1 tablet (81 mg total) by mouth daily.    Dispense:  90 tablet    Refill:  3    Matthew Hodges  is a 78 y.o.  Caucasian male with hypertension, hyperlipidemia, abdominal aortic stenosis with bilateral hip claudication, prior heavy Alcohol use, referred to me on an urgent basis for evaluation of worsening dyspnea over the past one month.  He has multiple cardiovascular risk factors and abnormal physical exam with bilateral carotid bruit, abnormal vascular lower extremity examination, hypertension and hyperlipidemia.  Will schedule for an echocardiogram.   Schedule for a Lexiscan Sestamibi stress test to evaluate for myocardial ischemia. Patient unable to do treadmill stress testing due to dyspnea and claudication. Obtain Lower extremity arterial duplex/ABI for evaluation of PAD and or claudication. Schedule for carotid duplex for bruit/follow-up surveillance of carotid stenosis.    Adrian Prows, MD, [facility] 11/26/2019, 6:43 PM [facility] Pager: [phone] Office: [phone]

## 2019-11-29 ENCOUNTER — Other Ambulatory Visit: Payer: Self-pay

## 2019-11-29 ENCOUNTER — Ambulatory Visit (INDEPENDENT_AMBULATORY_CARE_PROVIDER_SITE_OTHER): Payer: Medicare Other

## 2019-11-29 DIAGNOSIS — R0609 Other forms of dyspnea: Secondary | ICD-10-CM

## 2019-12-06 ENCOUNTER — Other Ambulatory Visit: Payer: Self-pay

## 2019-12-06 ENCOUNTER — Ambulatory Visit (INDEPENDENT_AMBULATORY_CARE_PROVIDER_SITE_OTHER): Payer: Medicare Other

## 2019-12-06 ENCOUNTER — Ambulatory Visit: Payer: Medicare Other

## 2019-12-06 DIAGNOSIS — I739 Peripheral vascular disease, unspecified: Secondary | ICD-10-CM | POA: Diagnosis not present

## 2019-12-06 DIAGNOSIS — R0609 Other forms of dyspnea: Secondary | ICD-10-CM | POA: Diagnosis not present

## 2019-12-06 DIAGNOSIS — R0989 Other specified symptoms and signs involving the circulatory and respiratory systems: Secondary | ICD-10-CM

## 2019-12-29 ENCOUNTER — Ambulatory Visit: Payer: Medicare Other

## 2020-01-03 ENCOUNTER — Other Ambulatory Visit: Payer: Self-pay

## 2020-01-03 ENCOUNTER — Encounter: Payer: Self-pay | Admitting: Cardiology

## 2020-01-03 ENCOUNTER — Ambulatory Visit: Payer: Medicare Other | Admitting: Cardiology

## 2020-01-03 VITALS — BP 150/78 | HR 67 | Temp 98.0°F | Ht 68.0 in | Wt 205.8 lb

## 2020-01-03 DIAGNOSIS — I10 Essential (primary) hypertension: Secondary | ICD-10-CM

## 2020-01-03 DIAGNOSIS — I2510 Atherosclerotic heart disease of native coronary artery without angina pectoris: Secondary | ICD-10-CM

## 2020-01-03 DIAGNOSIS — I739 Peripheral vascular disease, unspecified: Secondary | ICD-10-CM | POA: Diagnosis not present

## 2020-01-03 DIAGNOSIS — E7800 Pure hypercholesterolemia, unspecified: Secondary | ICD-10-CM | POA: Diagnosis not present

## 2020-01-03 DIAGNOSIS — R0609 Other forms of dyspnea: Secondary | ICD-10-CM | POA: Diagnosis not present

## 2020-01-03 MED ORDER — METOPROLOL SUCCINATE ER 50 MG PO TB24: 50.0000 mg | ORAL_TABLET | Freq: Every day | ORAL | 2 refills | Status: DC

## 2020-01-03 MED ORDER — LOSARTAN POTASSIUM-HCTZ 50-12.5 MG PO TABS: 1.0000 | ORAL_TABLET | Freq: Every day | ORAL | 2 refills | Status: DC

## 2020-01-03 MED ORDER — ATORVASTATIN CALCIUM 40 MG PO TABS: 40.0000 mg | ORAL_TABLET | Freq: Every day | ORAL | 1 refills | Status: DC

## 2020-01-03 NOTE — Patient Instructions (Signed)
Please have labs done at [facility] in 2 weeks and see me in 4 weeks.

## 2020-01-03 NOTE — Progress Notes (Signed)
Primary Physician/Referring:  London Pepper, MD  Patient ID: Matthew Hodges, male    DOB: [birth year], 79 y.o.   MRN: [id number]  Chief Complaint  Patient presents with  . Shortness of Breath  . Claudication  . Results    echo, stress, dopp  . Follow-up    6wk   HPI:    Matthew Hodges  is a 79 y.o. Caucasian male with hypertension, hyperlipidemia, abdominal aortic stenosis with bilateral hip claudication, prior heavy Alcohol use, presents for f/u of worsening dyspnea with exertion, bilateral thigh cramping suggestive of claudication and hypertension.   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  This is new to him although he has had mild chronic dyspnea and had remained stable.  His next-door neighbor had similar symptoms and ended up needing CABG and he got concerned as his symptoms very very similar.  He also states that over the past one year or so he has gained weight due to the fact that he has not been able to walk much due to severe bilateral hip pain and he has to frequently stop and rest.  He denies chest pain, palpitations, dizziness, syncope, TIA-like symptoms.  Past Medical History:  Diagnosis Date  . Aortic atherosclerosis ([place]) 11/10/2017   noted on CT abd/pelvis  . Aortic stenosis 2012  . Basal cell carcinoma    "sqattered"  . BBB (bundle branch block) 04/17/2015   Right   . Bilateral inguinal hernia 11/10/2017   possible small, noted on CT abd/pelvis  . Bilateral inguinal hernia (BIH) s/p lap repair w mesh 12/24/2017 12/24/2017  . Cerebral artery disease   . Claudication of both lower extremities ([place])   . Diverticular disease   . Frequent urination   . Hearing impaired    bilateral hearing aids  . Hearing loss   . Heart murmur    "after surgeries before"  . 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 Hyperlipidemia   . Hypertension    hx. of  . no meds since 2004  . Left femoral hernia s/p lap repair w mesh 12/24/2017 12/24/2017  . Left obturator hernia s/p lap repair w mesh 12/24/2017 12/24/2017  . Prostate cancer [facility])    treated with radiation  . Recurrent ventral incisional hernia s/p lap re-repair w mesh May 2016 04/23/2015  . Right acoustic neuroma ([place])   . SBO (small bowel obstruction) ([place]) 03/2016   last 04-21-16 admission([facility])"multiple occurrences"  . Squamous carcinoma    "sqattered"Scalp 1 polyethelene skin prostesis left frontal scalp area-removable.  . Stroke ([place]) 1979   no deficits  . Testicular cyst    history of right  . Wears dentures    upper and lower  . Wears glasses   . Wears hearing aid in both ears    Past Surgical History:  Procedure Laterality Date  . COLECTOMY WITH COLOSTOMY CREATION/HARTMANN PROCEDURE  01/1999   Hartmans procedure with colostomy  . COLONOSCOPY    . COLOSTOMY TAKEDOWN  08/1999  . INCISIONAL HERNIA REPAIR  2001   open w mesh  . INGUINAL HERNIA REPAIR N/A 12/24/2017   Procedure: LAPAROSCOPIC  BILATERAL INGUINAL HERNIA REPAIR WITH MESH, LYSIS OF ADHESIONS;  Surgeon: Michael Boston, MD;  Location: [place];  Service: General;  Laterality: N/A;  . LAPAROSCOPIC INCISIONAL / UMBILICAL / [place]  2008  with mesh.  LLQ old colostomy site  . LAPAROSCOPIC LYSIS OF ADHESIONS N/A 06/25/2016   Procedure: LAPAROSCOPIC LYSIS OF ADHESIONS;  Surgeon: Michael Boston, MD;  Location: [facility];  Service: General;  Laterality: N/A;  . LYSIS OF ADHESION N/A 04/23/2015   Procedure: LYSIS OF ADHESION;  Surgeon: Michael Boston, MD;  Location: [facility];  Service: General;  Laterality: N/A;  . MOHS SURGERY  02/2017  . PROSTATE BIOPSY  ~ 2002  . REMOVE SUTURE UNDER ANESTHESIA  2011   stitch abscesses LLQ & midline  . SMALL BOWEL REPAIR  03/1999   postop abscesses with fistula.  Abscesses drained.  Fistula repaired.  . TESTICLE SURGERY Right 1998  . VENTRAL HERNIA REPAIR N/A  04/23/2015   Procedure: LAPAROSCOPIC VENTRAL WALL HERNIA REPAIR AND LAP LYSIS OF ADHESIONS;  Surgeon: Michael Boston, MD;  Location: [facility];  Service: General;  Laterality: N/A;  With MESH  . VENTRAL HERNIA REPAIR N/A 12/29/2018   Procedure: LAPAROSCOPIC LYSIS OF ADHESIONS X2.5 HOURS, OPEN LYSIS OF ADHESIONS X1 HOUR, laparoscopic REPAIR OF INCARCERATED INCISIONAL HERNIA WITH MESH;  Surgeon: Michael Boston, MD;  Location: [facility];  Service: General;  Laterality: N/A;   Social History   Socioeconomic History  . Marital status: Married    Spouse name: Not on file  . Number of children: 2  . Years of education: Not on file  . Highest education level: Not on file  Occupational History  . Not on file  Tobacco Use  . Smoking status: Former Smoker    Packs/day: 2.00    Years: 47.00    Pack years: 94.00    Types: Cigarettes    Quit date: 11/30/2004    Years since quitting: 15.1  . Smokeless tobacco: Never Used  Substance and Sexual Activity  . Alcohol use: No    Comment: Quit-recovering alcoholic sober since 6384  . Drug use: No  . Sexual activity: Not Currently  Other Topics Concern  . Not on file  Social History Narrative  . Not on file   Social Determinants of Health   Financial Resource Strain:   . Difficulty of Paying Living Expenses: Not on file  Food Insecurity:   . Worried About Charity fundraiser in the Last Year: Not on file  . Ran Out of Food in the Last Year: Not on file  Transportation Needs:   . Lack of Transportation (Medical): Not on file  . Lack of Transportation (Non-Medical): Not on file  Physical Activity:   . Days of Exercise per Week: Not on file  . Minutes of Exercise per Session: Not on file  Stress:   . Feeling of Stress : Not on file  Social Connections:   . Frequency of Communication with Friends and Family: Not on file  . Frequency of Social Gatherings with Friends and Family: Not on file  . Attends Religious Services: Not on file  . Active Member of  Clubs or Organizations: Not on file  . Attends Archivist Meetings: Not on file  . Marital Status: Not on file  Intimate Partner Violence:   . Fear of Current or Ex-Partner: Not on file  . Emotionally Abused: Not on file  . Physically Abused: Not on file  . Sexually Abused: Not on file   ROS  Review of Systems  Constitution: Negative for chills, decreased appetite, malaise/fatigue and weight gain.  Cardiovascular: Positive for claudication and dyspnea on exertion. Negative for chest pain, leg swelling and syncope.  Endocrine: Negative for cold intolerance.  Hematologic/Lymphatic: Does not bruise/bleed easily.  Musculoskeletal: Negative for joint swelling.  Gastrointestinal: Negative for abdominal pain, anorexia, change in bowel habit, hematochezia and melena.  Neurological: Negative for headaches and light-headedness.  Psychiatric/Behavioral: Negative for depression and substance abuse.  All other systems reviewed and are negative.  Objective  Blood pressure (!) 150/78, pulse 67, temperature 98 F (36.7 C), height '5\' 8"'$  (1.727 m), weight 205 lb 12.8 oz (93.4 kg), SpO2 94 %.  Vitals with BMI 01/03/2020 11/22/2019 11/22/2019  Height '5\' 8"'$  - '5\' 8"'$   Weight 205 lbs 13 oz - 206 lbs 14 oz  BMI 63.8 - 93.73  Systolic 428 768 115  Diastolic 78 80 77  Pulse 67 - 93     Physical Exam  Constitutional: He appears well-developed and well-nourished.  HENT:  Head: Atraumatic.  Eyes: Conjunctivae are normal.  Neck: No JVD present. No thyromegaly present.  Cardiovascular: Normal rate, regular rhythm, normal heart sounds and intact distal pulses. Exam reveals no gallop.  No murmur heard. Pulses:      Carotid pulses are on the left side with bruit.      Femoral pulses are 2+ on the right side and 2+ on the left side.      Dorsalis pedis pulses are 2+ on the right side and 2+ on the left side.       Posterior tibial pulses are 1+ on the right side and 1+ on the left side.  No leg  edema, no JVD.  Pulmonary/Chest: Effort normal and breath sounds normal.  Abdominal: Soft. Bowel sounds are normal.  Musculoskeletal:        General: Normal range of motion.     Cervical back: Neck supple.  Neurological: He is alert.  Skin: Skin is warm and dry.  Psychiatric: He has a normal mood and affect.   Laboratory examination:   No results for input(s): NA, K, CL, CO2, GLUCOSE, BUN, CREATININE, CALCIUM, GFRNONAA, GFRAA in the last 8760 hours. CrCl cannot be calculated (Patient's most recent lab result is older than the maximum 21 days allowed.).  CMP Latest Ref Rng & Units 12/24/2017 06/26/2016 06/22/2016  Glucose 65 - 99 mg/dL 126(H) 103(H) 115(H)  BUN 6 - 20 mg/dL - 14 19  Creatinine 0.61 - 1.24 mg/dL - 1.10 1.15  Sodium 135 - 145 mmol/L 141 139 139  Potassium 3.5 - 5.1 mmol/L 4.1 4.1 3.9  Chloride 101 - 111 mmol/L - 104 107  CO2 22 - 32 mmol/L - 29 25  Calcium 8.9 - 10.3 mg/dL - 8.6(L) 8.8(L)  Total Protein 6.5 - 8.1 g/dL - - -  Total Bilirubin 0.3 - 1.2 mg/dL - - -  Alkaline Phos 38 - 126 U/L - - -  AST 15 - 41 U/L - - -  ALT 17 - 63 U/L - - -   CBC Latest Ref Rng & Units 12/28/2018 12/24/2017 06/26/2016  WBC 4.0 - 10.5 K/uL 7.7 - 6.9  Hemoglobin 13.0 - 17.0 g/dL 13.5 13.6 11.7(L)  Hematocrit 39.0 - 52.0 % 41.4 40.0 34.5(L)  Platelets 150 - 400 K/uL 268 - 188   Lipid Panel  No results found for: CHOL, TRIG, HDL, CHOLHDL, VLDL, LDLCALC, LDLDIRECT HEMOGLOBIN A1C Lab Results  Component Value Date   HGBA1C 5.9 (H) 02/04/2016   MPG 123 02/04/2016   TSH No results for input(s): TSH in the last 8760 hours.  Labs 11/10/2018: Serum glucose 93 mg, BUN 18, EGFR >60 mL, CMP  normal.  Total cholesterol 156, triglycerides 171, HDL 36, LDL 85.  PSA normal.  Medications and allergies   Allergies  Allergen Reactions  . Latex Rash    Prolong contact      Current Outpatient Medications  Medication Instructions  . aspirin EC 81 mg, Oral, Daily  . atorvastatin (LIPITOR) 40  mg, Oral, Daily at bedtime  . Co Q-10 300 mg, Oral, Daily  . docusate sodium (COLACE) 100 mg, Oral, Daily  . Fish Oil 1,200 mg, Oral, Daily  . losartan-hydrochlorothiazide (HYZAAR) 50-12.5 MG tablet 1 tablet, Oral, Daily  . Melatonin 5 mg, Oral, Daily  . metoprolol succinate (TOPROL-XL) 50 mg, Oral, Daily, Take with or immediately following a meal.  . mirabegron ER (MYRBETRIQ) 25 mg, Oral,  Every morning - 10a  . Multiple Vitamins-Minerals (MEGA MULTIVITAMIN FOR MEN PO) 1 tablet, Oral,  Every morning - 10a  . Vitamin D3 400 Units, Oral, Every evening    Radiology:  No results found.  Cardiac Studies:   Echocardiogram 10/15/2016:  Normal LV systolic function, EF 69-62%.  Mild LVH, grade 1 diastolic dysfunction.  Bilateral lower extremity segmental pressures 02/02/2017: 1. Normal bilateral resting ankle-brachial indices, arterial waveforms, pulse volume recordings and segmental pressures. 2. However, correlation with prior CT imaging demonstrates bulky calcified atherosclerotic plaque in the infrarenal abdominal aorta with a coral-reef configuration resulting in significant narrowing of the luminal diameter consistent with inflow disease. This could explain the patient's clinical symptoms of buttock/hip and upper thigh claudication.  Lexiscan Tetrofosmin stress test 11/29/2019: No previous exam available for comparison. Lexiscan/walking nuclear stress test performed using 1-day protocol. Stress EKG is non-diagnostic, as this is pharmacological stress test. Stress EKG at 77% MPHR showed sinus tachycardia, RBBB.  SPECT imaging show small sized, mild intensity, predominantly reversible perfusion defect in basal inferior myocardium. Stress LVEF 60%. Low risk study.  Carotid artery duplex 12/06/2019:  Mild heterogeneous plaque noted in the left carotid artery.  Doppler flow velocities in the left internal carotid and common carotid  artery are consistent with stenosis in the range of 1-15%  with mild  heterogeneous plaque.  Antegrade right vertebral artery flow. Antegrade left vertebral artery flow.  Follow up studies appropriate if clinically indicated.  Echocardiogram 12/06/2019:  Left ventricle cavity is normal in size. Mild concentric hypertrophy of  the left ventricle. Normal LV systolic function with EF 55%. Normal global  wall motion. Doppler evidence of grade I (impaired) diastolic dysfunction,  normal LAP.  Trileaflet aortic valve. Mild to moderate aortic regurgitation.  Moderate (Grade II) mitral regurgitation.  Mild tricuspid regurgitation. Estimated pulmonary artery systolic pressure is 20 mmHg.  Lower Extremity Arterial Duplex 12/06/2019:  No hemodynamically significant stenoses are identified in the bilateral  lower extremity arterial system.   This exam reveals mildly decreased perfusion of the right lower extremity,  noted at the post tibial artery level (ABI 0.94) and mildly decreased  perfusion of the left lower extremity, noted at the post tibial artery  level (ABI 0.94).  Consider evaluation for pseudoclaudication.  Assessment     ICD-10-CM   1. DOE (dyspnea on exertion)  R06.00   2. Coronary artery calcification seen on CAT scan 2019  I25.10   3. Claudication of gluteal region ([place])  I73.9   4. Hypercholesteremia  E78.00 atorvastatin (LIPITOR) 40 MG tablet    Lipid Panel With LDL/HDL Ratio  5. Primary hypertension  I10 metoprolol succinate (TOPROL-XL) 50 MG 24 hr tablet    CMP14+EGFR    EKG 11/22/2019:  Normal sinus rhythm at rate of 82 bpm, left atrial enlargement, left axis deviation, left anterior fascicular block.  Incomplete right bundle branch block.  Poor R wave progression, cannot exclude anteroseptal infarct old.  Nonspecific T abnormality.    Recommendations:   Meds ordered this encounter  Medications  . atorvastatin (LIPITOR) 40 MG tablet    Sig: Take 1 tablet (40 mg total) by mouth at bedtime.    Dispense:  90 tablet    Refill:   1  . metoprolol succinate (TOPROL-XL) 50 MG 24 hr tablet    Sig: Take 1 tablet (50 mg total) by mouth daily. Take with or immediately following a meal.    Dispense:  90 tablet    Refill:  2  . losartan-hydrochlorothiazide (HYZAAR) 50-12.5 MG tablet    Sig: Take 1 tablet by mouth daily.    Dispense:  30 tablet    Refill:  2    Matthew Hodges  is a 79 y.o. Caucasian male with hypertension, hyperlipidemia, abdominal aortic stenosis with bilateral hip claudication, prior heavy Alcohol use,  worsening dyspnea. He has multiple cardiovascular risk factors and abnormal physical exam with bilateral carotid bruit, abnormal vascular lower extremity examination, hypertension and hyperlipidemia.    I have reviewed the results of the tests,, patient taken to complex and his wife is present.  Extensive discussion was held regarding medical therapy versus proceeding with angiography both for claudication symptoms and also for dyspnea on exertion including coronary angiogram and PV arteriogram.  Overall low to stress test, would recommend aggressive risk factor modification.  Could consider a trial of Pletal.   He is tolerating Lipitor 40 mg once a day, I have refilled the prescription, he is also tolerating metoprolol succinate 50 mg daily for hypertension and also presumed myocardial ischemia which she will continue.  I have added losartan HCT 50/12.5 mg in the morning and will obtain lipids along with BMP in 2 weeks and see me back in 4 weeks.    Extensive discussion was also held regarding weight loss. Weight loss stressed again and gave positive reinforcement. [facility] diet (low glycemic) sheet given to the patient.  Discussed regarding DASH diet and salt restriction. This was a 40-minute office visit encounter.   Adrian Prows, MD, [facility] 01/03/2020, 8:39 PM [facility]. [place] Office: [phone]

## 2020-01-06 ENCOUNTER — Ambulatory Visit: Payer: Medicare Other | Attending: Internal Medicine

## 2020-01-06 DIAGNOSIS — Z23 Encounter for immunization: Secondary | ICD-10-CM

## 2020-01-06 NOTE — Progress Notes (Signed)
   Covid-19 Vaccination Clinic  Name:  Matthew Hodges    MRN: [id number] DOB: 01-07-1941  01/06/2020  [name] was observed post Covid-19 immunization for 15 minutes without incidence. He was provided with Vaccine Information Sheet and instruction to access the V-Safe system.   [name] was instructed to call 911 with any severe reactions post vaccine: Marland Kitchen Difficulty breathing  . Swelling of your face and throat  . A fast heartbeat  . A bad rash all over your body  . Dizziness and weakness    Immunizations Administered    Name Date Dose VIS Date Route   Pfizer COVID-19 Vaccine 01/06/2020  3:27 PM 0.3 mL 11/10/2019 Intramuscular   Manufacturer: [place]   Lot: [id number]   [place]: [id number]

## 2020-01-22 ENCOUNTER — Other Ambulatory Visit: Payer: Self-pay | Admitting: Cardiology

## 2020-01-22 DIAGNOSIS — E7800 Pure hypercholesterolemia, unspecified: Secondary | ICD-10-CM

## 2020-01-22 DIAGNOSIS — I10 Essential (primary) hypertension: Secondary | ICD-10-CM

## 2020-01-24 LAB — CMP14+EGFR
ALT: 19 IU/L (ref 0–44)
AST: 23 IU/L (ref 0–40)
Albumin/Globulin Ratio: 1.8 (ref 1.2–2.2)
Albumin: 4.5 g/dL (ref 3.7–4.7)
Alkaline Phosphatase: 89 IU/L (ref 39–117)
BUN/Creatinine Ratio: 13 (ref 10–24)
BUN: 16 mg/dL (ref 8–27)
Bilirubin Total: 0.4 mg/dL (ref 0.0–1.2)
CO2: 23 mmol/L (ref 20–29)
Calcium: 9.9 mg/dL (ref 8.6–10.2)
Chloride: 98 mmol/L (ref 96–106)
Creatinine, Ser: 1.24 mg/dL (ref 0.76–1.27)
GFR calc Af Amer: 64 mL/min/{1.73_m2} (ref >59)
GFR calc non Af Amer: 55 mL/min/{1.73_m2} — ABNORMAL LOW (ref >59)
Globulin, Total: 2.5 g/dL (ref 1.5–4.5)
Glucose: 100 mg/dL — ABNORMAL HIGH (ref 65–99)
Potassium: 5.3 mmol/L — ABNORMAL HIGH (ref 3.5–5.2)
Sodium: 138 mmol/L (ref 134–144)
Total Protein: 7 g/dL (ref 6.0–8.5)

## 2020-01-24 LAB — LIPID PANEL WITH LDL/HDL RATIO
Cholesterol, Total: 174 mg/dL (ref 100–199)
HDL: 34 mg/dL — ABNORMAL LOW (ref >39)
LDL Chol Calc (NIH): 104 mg/dL — ABNORMAL HIGH (ref 0–99)
LDL/HDL Ratio: 3.1 ratio (ref 0.0–3.6)
Triglycerides: 208 mg/dL — ABNORMAL HIGH (ref 0–149)
VLDL Cholesterol Cal: 36 mg/dL (ref 5–40)

## 2020-01-24 NOTE — Progress Notes (Signed)
Mild decrease in renal function, but overall stable. Hyperkalemia mild and will advise patient to avoid potassium rich foods. Lipids need improvement

## 2020-01-31 ENCOUNTER — Ambulatory Visit: Payer: Medicare Other | Attending: Internal Medicine

## 2020-01-31 DIAGNOSIS — Z23 Encounter for immunization: Secondary | ICD-10-CM | POA: Insufficient documentation

## 2020-01-31 NOTE — Progress Notes (Signed)
   Covid-19 Vaccination Clinic  Name:  Matthew Hodges    MRN: [id number] DOB: May 09, 1941  01/31/2020  [name] was observed post Covid-19 immunization for 15 minutes without incident. He was provided with Vaccine Information Sheet and instruction to access the V-Safe system.   [name] was instructed to call 911 with any severe reactions post vaccine: Marland Kitchen Difficulty breathing  . Swelling of face and throat  . A fast heartbeat  . A bad rash all over body  . Dizziness and weakness   Immunizations Administered    Name Date Dose VIS Date Route   Pfizer COVID-19 Vaccine 01/31/2020 11:37 AM 0.3 mL 11/10/2019 Intramuscular   Manufacturer: [place]   Lot: [id number]   [place]: [id number]

## 2020-02-05 ENCOUNTER — Encounter: Payer: Self-pay | Admitting: Cardiology

## 2020-02-05 ENCOUNTER — Other Ambulatory Visit: Payer: Self-pay

## 2020-02-05 ENCOUNTER — Ambulatory Visit: Payer: Medicare Other | Admitting: Cardiology

## 2020-02-05 VITALS — BP 191/71 | HR 60 | Temp 98.0°F | Resp 14 | Ht 68.0 in | Wt 203.0 lb

## 2020-02-05 DIAGNOSIS — R0609 Other forms of dyspnea: Secondary | ICD-10-CM

## 2020-02-05 DIAGNOSIS — E7800 Pure hypercholesterolemia, unspecified: Secondary | ICD-10-CM

## 2020-02-05 DIAGNOSIS — Q251 Coarctation of aorta: Secondary | ICD-10-CM

## 2020-02-05 DIAGNOSIS — I739 Peripheral vascular disease, unspecified: Secondary | ICD-10-CM

## 2020-02-05 DIAGNOSIS — I10 Essential (primary) hypertension: Secondary | ICD-10-CM

## 2020-02-05 MED ORDER — AMLODIPINE BESYLATE 5 MG PO TABS: 5.0000 mg | ORAL_TABLET | Freq: Every day | ORAL | 2 refills | Status: DC

## 2020-02-05 MED ORDER — EZETIMIBE 10 MG PO TABS: 10.0000 mg | ORAL_TABLET | Freq: Every day | ORAL | 2 refills | Status: DC

## 2020-02-05 NOTE — H&P (View-Only) (Signed)
Primary Physician/Referring:  London Pepper, MD  Patient ID: Matthew Hodges, male    DOB: 1941/03/21, 79 y.o.   MRN: [id number]  Chief Complaint  Patient presents with  . Hypertension  . Shortness of Breath  . Claudication  . Follow-up    4 week    HPI:    Matthew Hodges  is a 79 y.o. Caucasian male with hypertension, hyperlipidemia, abdominal aortic stenosis with bilateral hip claudication, prior heavy Alcohol use, presents for f/u of worsening dyspnea with exertion, bilateral thigh cramping suggestive of claudication and hypertension.  He was started on losartan and also beta-blocker metoprolol, developed mild worsening renal function and also hyperkalemia hence losartan was discontinued 3 days ago.  States that his blood pressure has improved since being on metoprolol.  Continues to complain of severe bilateral hip pain as his primary complaint today.  He has noticed some improvement in dyspnea.  Past Medical History:  Diagnosis Date  . Aortic atherosclerosis ([place]) 11/10/2017   noted on CT abd/pelvis  . Aortic stenosis 2012  . Basal cell carcinoma    "sqattered"  . BBB (bundle branch block) 04/17/2015   Right   . Bilateral inguinal hernia 11/10/2017   possible small, noted on CT abd/pelvis  . Bilateral inguinal hernia (BIH) s/p lap repair w mesh 12/24/2017 12/24/2017  . Cerebral artery disease   . Claudication of both lower extremities ([place])   . Diverticular disease   . Frequent urination   . Hearing impaired    bilateral hearing aids  . Hearing loss   . Heart murmur    "after surgeries before"  . 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 Hyperlipidemia   . Hypertension    hx. of . no meds since 2004  . Left femoral hernia s/p lap repair w mesh 12/24/2017 12/24/2017  . Left obturator hernia s/p lap repair w mesh 12/24/2017 12/24/2017  . Prostate cancer [facility])    treated with radiation  . Recurrent ventral incisional hernia s/p lap re-repair w  mesh May 2016 04/23/2015  . Right acoustic neuroma ([place])   . SBO (small bowel obstruction) ([place]) 03/2016   last 04-21-16 admission([facility])"multiple occurrences"  . Squamous carcinoma    "sqattered"Scalp 1 polyethelene skin prostesis left frontal scalp area-removable.  . Stroke ([place]) 1979   no deficits  . Testicular cyst    history of right  . Wears dentures    upper and lower  . Wears glasses   . Wears hearing aid in both ears    Past Surgical History:  Procedure Laterality Date  . COLECTOMY WITH COLOSTOMY CREATION/HARTMANN PROCEDURE  01/1999   Hartmans procedure with colostomy  . COLONOSCOPY    . COLOSTOMY TAKEDOWN  08/1999  . INCISIONAL HERNIA REPAIR  2001   open w mesh  . INGUINAL HERNIA REPAIR N/A 12/24/2017   Procedure: LAPAROSCOPIC  BILATERAL INGUINAL HERNIA REPAIR WITH MESH, LYSIS OF ADHESIONS;  Surgeon: Michael Boston, MD;  Location: [place];  Service: General;  Laterality: N/A;  . LAPAROSCOPIC INCISIONAL / UMBILICAL / [place]  2008   with mesh.  LLQ old colostomy site  . LAPAROSCOPIC LYSIS OF ADHESIONS N/A 06/25/2016   Procedure: LAPAROSCOPIC LYSIS OF ADHESIONS;  Surgeon: Michael Boston, MD;  Location: [facility];  Service: General;  Laterality: N/A;  . LYSIS OF ADHESION N/A 04/23/2015   Procedure: LYSIS OF ADHESION;  Surgeon: Michael Boston, MD;  Location: [facility];  Service: General;  Laterality:  N/A;  . MOHS SURGERY  02/2017  . PROSTATE BIOPSY  ~ 2002  . REMOVE SUTURE UNDER ANESTHESIA  2011   stitch abscesses LLQ & midline  . SMALL BOWEL REPAIR  03/1999   postop abscesses with fistula.  Abscesses drained.  Fistula repaired.  . TESTICLE SURGERY Right 1998  . VENTRAL HERNIA REPAIR N/A 04/23/2015   Procedure: LAPAROSCOPIC VENTRAL WALL HERNIA REPAIR AND LAP LYSIS OF ADHESIONS;  Surgeon: Michael Boston, MD;  Location: [facility];  Service: General;  Laterality: N/A;  With MESH  . VENTRAL HERNIA REPAIR N/A 12/29/2018   Procedure: LAPAROSCOPIC LYSIS OF ADHESIONS  X2.5 HOURS, OPEN LYSIS OF ADHESIONS X1 HOUR, laparoscopic REPAIR OF INCARCERATED INCISIONAL HERNIA WITH MESH;  Surgeon: Michael Boston, MD;  Location: [facility];  Service: General;  Laterality: N/A;   Social History   Tobacco Use  . Smoking status: Former Smoker    Packs/day: 2.00    Years: 47.00    Pack years: 94.00    Types: Cigarettes    Quit date: 11/30/2004    Years since quitting: 15.1  . Smokeless tobacco: Never Used  Substance Use Topics  . Alcohol use: No    Comment: Quit-recovering alcoholic sober since 8588    ROS  Review of Systems  Cardiovascular: Positive for claudication and dyspnea on exertion. Negative for chest pain and leg swelling.  Gastrointestinal: Negative for melena.   Objective  Blood pressure (!) 191/71, pulse 60, temperature 98 F (36.7 C), temperature source Temporal, resp. rate 14, height '5\' 8"'$  (1.727 m), weight 203 lb (92.1 kg), SpO2 95 %.  Vitals with BMI 02/05/2020 01/03/2020 11/22/2019  Height '5\' 8"'$  '5\' 8"'$  -  Weight 203 lbs 205 lbs 13 oz -  BMI 50.27 74.1 -  Systolic 287 867 672  Diastolic 71 78 80  Pulse 60 67 -     Physical Exam  Constitutional: He appears well-developed and well-nourished.  HENT:  Head: Atraumatic.  Cardiovascular: Normal rate, regular rhythm, normal heart sounds and intact distal pulses. Exam reveals no gallop.  No murmur heard. Pulses:      Carotid pulses are on the left side with bruit.      Femoral pulses are 2+ on the right side and 2+ on the left side.      Dorsalis pedis pulses are 2+ on the right side and 2+ on the left side.       Posterior tibial pulses are 1+ on the right side and 1+ on the left side.  No leg edema, no JVD.  Pulmonary/Chest: Effort normal and breath sounds normal.  Abdominal: Soft. Bowel sounds are normal.   Laboratory examination:   Recent Labs    01/23/20 0855  NA 138  K 5.3*  CL 98  CO2 23  GLUCOSE 100*  BUN 16  CREATININE 1.24  CALCIUM 9.9  GFRNONAA 74*  GFRAA 64   estimated  creatinine clearance is 54.1 mL/min (by C-G formula based on SCr of 1.24 mg/dL).  CMP Latest Ref Rng & Units 01/23/2020 12/24/2017 06/26/2016  Glucose 65 - 99 mg/dL 100(H) 126(H) 103(H)  BUN 8 - 27 mg/dL 16 - 14  Creatinine 0.76 - 1.27 mg/dL 1.24 - 1.10  Sodium 134 - 144 mmol/L 138 141 139  Potassium 3.5 - 5.2 mmol/L 5.3(H) 4.1 4.1  Chloride 96 - 106 mmol/L 98 - 104  CO2 20 - 29 mmol/L 23 - 29  Calcium 8.6 - 10.2 mg/dL 9.9 - 8.6(L)  Total Protein 6.0 - 8.5  g/dL 7.0 - -  Total Bilirubin 0.0 - 1.2 mg/dL 0.4 - -  Alkaline Phos 39 - 117 IU/L 89 - -  AST 0 - 40 IU/L 23 - -  ALT 0 - 44 IU/L 19 - -   CBC Latest Ref Rng & Units 12/28/2018 12/24/2017 06/26/2016  WBC 4.0 - 10.5 K/uL 7.7 - 6.9  Hemoglobin 13.0 - 17.0 g/dL 13.5 13.6 11.7(L)  Hematocrit 39.0 - 52.0 % 41.4 40.0 34.5(L)  Platelets 150 - 400 K/uL 268 - 188   Lipid Panel     Component Value Date/Time   CHOL 174 01/23/2020 0854   TRIG 208 (H) 01/23/2020 0854   HDL 34 (L) 01/23/2020 0854   LDLCALC 104 (H) 01/23/2020 0854   HEMOGLOBIN A1C Lab Results  Component Value Date   HGBA1C 5.9 (H) 02/04/2016   MPG 123 02/04/2016   TSH No results for input(s): TSH in the last 8760 hours.  Labs 11/10/2018: Serum glucose 93 mg, BUN 18, EGFR >60 mL, CMP normal.  Total cholesterol 156, triglycerides 171, HDL 36, LDL 85.  PSA normal.  Medications and allergies   Allergies  Allergen Reactions  . Latex Rash    Prolong contact      Current Outpatient Medications  Medication Instructions  . amLODipine (NORVASC) 5 mg, Oral, Daily  . aspirin EC 81 mg, Oral, Daily  . atorvastatin (LIPITOR) 40 mg, Oral, Daily at bedtime  . Co Q-10 300 mg, Oral, Daily  . docusate sodium (COLACE) 100 mg, Oral, Daily  . ezetimibe (ZETIA) 10 mg, Oral, Daily  . Fish Oil 1,200 mg, Oral, Daily  . Melatonin 5 mg, Oral, Daily  . metoprolol succinate (TOPROL-XL) 50 mg, Oral, Daily, Take with or immediately following a meal.  . mirabegron ER (MYRBETRIQ) 25 mg,  Oral,  Every morning - 10a  . Multiple Vitamins-Minerals (MEGA MULTIVITAMIN FOR MEN PO) 1 tablet, Oral,  Every morning - 10a  . Vitamin D3 400 Units, Oral, Every evening    Radiology:  No results found.  Cardiac Studies:   Echocardiogram 10/15/2016:  Normal LV systolic function, EF 47-42%.  Mild LVH, grade 1 diastolic dysfunction.  Bilateral lower extremity segmental pressures 02/02/2017: 1. Normal bilateral resting ankle-brachial indices, arterial waveforms, pulse volume recordings and segmental pressures. 2. However, correlation with prior CT imaging demonstrates bulky calcified atherosclerotic plaque in the infrarenal abdominal aorta with a coral-reef configuration resulting in significant narrowing of the luminal diameter consistent with inflow disease. This could explain the patient's clinical symptoms of buttock/hip and upper thigh claudication.  Lexiscan Tetrofosmin stress test 11/29/2019: No previous exam available for comparison. Lexiscan/walking nuclear stress test performed using 1-day protocol. Stress EKG is non-diagnostic, as this is pharmacological stress test. Stress EKG at 77% MPHR showed sinus tachycardia, RBBB.  SPECT imaging show small sized, mild intensity, predominantly reversible perfusion defect in basal inferior myocardium. Stress LVEF 60%. Low risk study.  Carotid artery duplex 12/06/2019:  Mild heterogeneous plaque noted in the left carotid artery.  Doppler flow velocities in the left internal carotid and common carotid  artery are consistent with stenosis in the range of 1-15% with mild heterogeneous plaque.  Antegrade right vertebral artery flow. Antegrade left vertebral artery flow.  Follow up studies appropriate if clinically indicated.  Echocardiogram 12/06/2019:  Left ventricle cavity is normal in size. Mild concentric hypertrophy of  the left ventricle. Normal LV systolic function with EF 55%. Normal global  wall motion. Doppler evidence of grade I  (impaired) diastolic dysfunction,  normal  LAP.  Trileaflet aortic valve. Mild to moderate aortic regurgitation.  Moderate (Grade II) mitral regurgitation.  Mild tricuspid regurgitation. Estimated pulmonary artery systolic pressure is 20 mmHg.  Lower Extremity Arterial Duplex 12/06/2019:  No hemodynamically significant stenoses are identified in the bilateral  lower extremity arterial system.   This exam reveals mildly decreased perfusion of the right lower extremity,  noted at the post tibial artery level (ABI 0.94) and mildly decreased  perfusion of the left lower extremity, noted at the post tibial artery  level (ABI 0.94).  Consider evaluation for pseudoclaudication.  Assessment     ICD-10-CM   1. Resistant hypertension  I10 amLODipine (NORVASC) 5 MG tablet    Basic metabolic panel    CBC    TSH  2. Hypercholesteremia  E78.00 ezetimibe (ZETIA) 10 MG tablet  3. Claudication of gluteal region ([place])  I73.9   4. Abdominal aortic stenosis CTA abd 02/04/2016  Q25.1   5. DOE (dyspnea on exertion)  R06.00     EKG 11/22/2019: Normal sinus rhythm at rate of 82 bpm, left atrial enlargement, left axis deviation, left anterior fascicular block.  Incomplete right bundle branch block.  Poor R wave progression, cannot exclude anteroseptal infarct old.  Nonspecific T abnormality.    Recommendations:   Meds ordered this encounter  Medications  . amLODipine (NORVASC) 5 MG tablet    Sig: Take 1 tablet (5 mg total) by mouth daily.    Dispense:  30 tablet    Refill:  2  . ezetimibe (ZETIA) 10 MG tablet    Sig: Take 1 tablet (10 mg total) by mouth daily.    Dispense:  30 tablet    Refill:  2    Matthew Hodges  is a 79 y.o. Caucasian male with hypertension, hyperlipidemia, abdominal aortic stenosis with bilateral hip claudication, prior heavy Alcohol use, chronic dyspnea on exertion,  Presents for evaluation.  In view of his symptoms suggestive of claudication, I have recommended that we  proceed with arteriogram.  Since he has got resistant hypertension, I also suspect he probably has bilateral renal artery stenosis as recently after I started him on ARB, he developed hyponatremia and worsening renal function.  He is also tolerating metoprolol succinate 50 mg daily for hypertension and also presumed myocardial ischemia which he will continue along with starting amlodipine 5 mg daily.  Also amlodipine may help with this presumed dyspnea on exertion as anginal equivalent.  Extensive discussion was held regarding medical therapy versus proceeding with angiography both for claudication symptoms. He is tolerating Lipitor 40 mg once a day, LDL is still not at goal, will add Zetia 10 mg daily. Extensive discussion was also held regarding weight loss. Weight loss stressed again and gave positive reinforcement.    Will schedule for peripheral arteriogram and possible angioplasty given symptoms. Patient understands the risks, benefits, alternatives including medical therapy, CT angiography. Patient understands <1-2% risk of death, embolic complications, bleeding, infection, renal failure, urgent surgical revascularization, but not limited to these and wants to proceed.   Adrian Prows, MD, [facility] 02/05/2020, 7:04 PM [facility] [place] Office: [phone]

## 2020-02-05 NOTE — Progress Notes (Signed)
Primary Physician/Referring:  London Pepper, MD  Patient ID: Matthew Hodges, male    DOB: [birth year], 79 y.o.   MRN: [id number]  Chief Complaint  Patient presents with  . Hypertension  . Shortness of Breath  . Claudication  . Follow-up    4 week    HPI:    Matthew Hodges  is a 79 y.o. Caucasian male with hypertension, hyperlipidemia, abdominal aortic stenosis with bilateral hip claudication, prior heavy Alcohol use, presents for f/u of worsening dyspnea with exertion, bilateral thigh cramping suggestive of claudication and hypertension.  He was started on losartan and also beta-blocker metoprolol, developed mild worsening renal function and also hyperkalemia hence losartan was discontinued 3 days ago.  States that his blood pressure has improved since being on metoprolol.  Continues to complain of severe bilateral hip pain as his primary complaint today.  He has noticed some improvement in dyspnea.  Past Medical History:  Diagnosis Date  . Aortic atherosclerosis ([place]) 11/10/2017   noted on CT abd/pelvis  . Aortic stenosis 2012  . Basal cell carcinoma    "sqattered"  . BBB (bundle branch block) 04/17/2015   Right   . Bilateral inguinal hernia 11/10/2017   possible small, noted on CT abd/pelvis  . Bilateral inguinal hernia (BIH) s/p lap repair w mesh 12/24/2017 12/24/2017  . Cerebral artery disease   . Claudication of both lower extremities ([place])   . Diverticular disease   . Frequent urination   . Hearing impaired    bilateral hearing aids  . Hearing loss   . Heart murmur    "after surgeries before"  . 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 Hyperlipidemia   . Hypertension    hx. of . no meds since 2004  . Left femoral hernia s/p lap repair w mesh 12/24/2017 12/24/2017  . Left obturator hernia s/p lap repair w mesh 12/24/2017 12/24/2017  . Prostate cancer [facility])    treated with radiation  . Recurrent ventral incisional hernia s/p lap re-repair w  mesh May 2016 04/23/2015  . Right acoustic neuroma ([place])   . SBO (small bowel obstruction) ([place]) 03/2016   last 04-21-16 admission([facility])"multiple occurrences"  . Squamous carcinoma    "sqattered"Scalp 1 polyethelene skin prostesis left frontal scalp area-removable.  . Stroke ([place]) 1979   no deficits  . Testicular cyst    history of right  . Wears dentures    upper and lower  . Wears glasses   . Wears hearing aid in both ears    Past Surgical History:  Procedure Laterality Date  . COLECTOMY WITH COLOSTOMY CREATION/HARTMANN PROCEDURE  01/1999   Hartmans procedure with colostomy  . COLONOSCOPY    . COLOSTOMY TAKEDOWN  08/1999  . INCISIONAL HERNIA REPAIR  2001   open w mesh  . INGUINAL HERNIA REPAIR N/A 12/24/2017   Procedure: LAPAROSCOPIC  BILATERAL INGUINAL HERNIA REPAIR WITH MESH, LYSIS OF ADHESIONS;  Surgeon: Michael Boston, MD;  Location: [facility];  Service: General;  Laterality: N/A;  . LAPAROSCOPIC INCISIONAL / UMBILICAL / [place]  2008   with mesh.  LLQ old colostomy site  . LAPAROSCOPIC LYSIS OF ADHESIONS N/A 06/25/2016   Procedure: LAPAROSCOPIC LYSIS OF ADHESIONS;  Surgeon: Michael Boston, MD;  Location: [facility];  Service: General;  Laterality: N/A;  . LYSIS OF ADHESION N/A 04/23/2015   Procedure: LYSIS OF ADHESION;  Surgeon: Michael Boston, MD;  Location: [facility];  Service: General;  Laterality:  N/A;  . MOHS SURGERY  02/2017  . PROSTATE BIOPSY  ~ 2002  . REMOVE SUTURE UNDER ANESTHESIA  2011   stitch abscesses LLQ & midline  . SMALL BOWEL REPAIR  03/1999   postop abscesses with fistula.  Abscesses drained.  Fistula repaired.  . TESTICLE SURGERY Right 1998  . VENTRAL HERNIA REPAIR N/A 04/23/2015   Procedure: LAPAROSCOPIC VENTRAL WALL HERNIA REPAIR AND LAP LYSIS OF ADHESIONS;  Surgeon: Michael Boston, MD;  Location: [facility];  Service: General;  Laterality: N/A;  With MESH  . VENTRAL HERNIA REPAIR N/A 12/29/2018   Procedure: LAPAROSCOPIC LYSIS OF ADHESIONS  X2.5 HOURS, OPEN LYSIS OF ADHESIONS X1 HOUR, laparoscopic REPAIR OF INCARCERATED INCISIONAL HERNIA WITH MESH;  Surgeon: Michael Boston, MD;  Location: [facility];  Service: General;  Laterality: N/A;   Social History   Tobacco Use  . Smoking status: Former Smoker    Packs/day: 2.00    Years: 47.00    Pack years: 94.00    Types: Cigarettes    Quit date: 11/30/2004    Years since quitting: 15.1  . Smokeless tobacco: Never Used  Substance Use Topics  . Alcohol use: No    Comment: Quit-recovering alcoholic sober since 3007    ROS  Review of Systems  Cardiovascular: Positive for claudication and dyspnea on exertion. Negative for chest pain and leg swelling.  Gastrointestinal: Negative for melena.   Objective  Blood pressure (!) 191/71, pulse 60, temperature 98 F (36.7 C), temperature source Temporal, resp. rate 14, height '5\' 8"'$  (1.727 m), weight 203 lb (92.1 kg), SpO2 95 %.  Vitals with BMI 02/05/2020 01/03/2020 11/22/2019  Height '5\' 8"'$  '5\' 8"'$  -  Weight 203 lbs 205 lbs 13 oz -  BMI 62.26 33.3 -  Systolic 545 625 638  Diastolic 71 78 80  Pulse 60 67 -     Physical Exam  Constitutional: He appears well-developed and well-nourished.  HENT:  Head: Atraumatic.  Cardiovascular: Normal rate, regular rhythm, normal heart sounds and intact distal pulses. Exam reveals no gallop.  No murmur heard. Pulses:      Carotid pulses are on the left side with bruit.      Femoral pulses are 2+ on the right side and 2+ on the left side.      Dorsalis pedis pulses are 2+ on the right side and 2+ on the left side.       Posterior tibial pulses are 1+ on the right side and 1+ on the left side.  No leg edema, no JVD.  Pulmonary/Chest: Effort normal and breath sounds normal.  Abdominal: Soft. Bowel sounds are normal.   Laboratory examination:   Recent Labs    01/23/20 0855  NA 138  K 5.3*  CL 98  CO2 23  GLUCOSE 100*  BUN 16  CREATININE 1.24  CALCIUM 9.9  GFRNONAA 1*  GFRAA 64   estimated  creatinine clearance is 54.1 mL/min (by C-G formula based on SCr of 1.24 mg/dL).  CMP Latest Ref Rng & Units 01/23/2020 12/24/2017 06/26/2016  Glucose 65 - 99 mg/dL 100(H) 126(H) 103(H)  BUN 8 - 27 mg/dL 16 - 14  Creatinine 0.76 - 1.27 mg/dL 1.24 - 1.10  Sodium 134 - 144 mmol/L 138 141 139  Potassium 3.5 - 5.2 mmol/L 5.3(H) 4.1 4.1  Chloride 96 - 106 mmol/L 98 - 104  CO2 20 - 29 mmol/L 23 - 29  Calcium 8.6 - 10.2 mg/dL 9.9 - 8.6(L)  Total Protein 6.0 - 8.5  g/dL 7.0 - -  Total Bilirubin 0.0 - 1.2 mg/dL 0.4 - -  Alkaline Phos 39 - 117 IU/L 89 - -  AST 0 - 40 IU/L 23 - -  ALT 0 - 44 IU/L 19 - -   CBC Latest Ref Rng & Units 12/28/2018 12/24/2017 06/26/2016  WBC 4.0 - 10.5 K/uL 7.7 - 6.9  Hemoglobin 13.0 - 17.0 g/dL 13.5 13.6 11.7(L)  Hematocrit 39.0 - 52.0 % 41.4 40.0 34.5(L)  Platelets 150 - 400 K/uL 268 - 188   Lipid Panel     Component Value Date/Time   CHOL 174 01/23/2020 0854   TRIG 208 (H) 01/23/2020 0854   HDL 34 (L) 01/23/2020 0854   LDLCALC 104 (H) 01/23/2020 0854   HEMOGLOBIN A1C Lab Results  Component Value Date   HGBA1C 5.9 (H) 02/04/2016   MPG 123 02/04/2016   TSH No results for input(s): TSH in the last 8760 hours.  Labs 11/10/2018: Serum glucose 93 mg, BUN 18, EGFR >60 mL, CMP normal.  Total cholesterol 156, triglycerides 171, HDL 36, LDL 85.  PSA normal.  Medications and allergies   Allergies  Allergen Reactions  . Latex Rash    Prolong contact      Current Outpatient Medications  Medication Instructions  . amLODipine (NORVASC) 5 mg, Oral, Daily  . aspirin EC 81 mg, Oral, Daily  . atorvastatin (LIPITOR) 40 mg, Oral, Daily at bedtime  . Co Q-10 300 mg, Oral, Daily  . docusate sodium (COLACE) 100 mg, Oral, Daily  . ezetimibe (ZETIA) 10 mg, Oral, Daily  . Fish Oil 1,200 mg, Oral, Daily  . Melatonin 5 mg, Oral, Daily  . metoprolol succinate (TOPROL-XL) 50 mg, Oral, Daily, Take with or immediately following a meal.  . mirabegron ER (MYRBETRIQ) 25 mg,  Oral,  Every morning - 10a  . Multiple Vitamins-Minerals (MEGA MULTIVITAMIN FOR MEN PO) 1 tablet, Oral,  Every morning - 10a  . Vitamin D3 400 Units, Oral, Every evening    Radiology:  No results found.  Cardiac Studies:   Echocardiogram 10/15/2016:  Normal LV systolic function, EF 02-54%.  Mild LVH, grade 1 diastolic dysfunction.  Bilateral lower extremity segmental pressures 02/02/2017: 1. Normal bilateral resting ankle-brachial indices, arterial waveforms, pulse volume recordings and segmental pressures. 2. However, correlation with prior CT imaging demonstrates bulky calcified atherosclerotic plaque in the infrarenal abdominal aorta with a coral-reef configuration resulting in significant narrowing of the luminal diameter consistent with inflow disease. This could explain the patient's clinical symptoms of buttock/hip and upper thigh claudication.  Lexiscan Tetrofosmin stress test 11/29/2019: No previous exam available for comparison. Lexiscan/walking nuclear stress test performed using 1-day protocol. Stress EKG is non-diagnostic, as this is pharmacological stress test. Stress EKG at 77% MPHR showed sinus tachycardia, RBBB.  SPECT imaging show small sized, mild intensity, predominantly reversible perfusion defect in basal inferior myocardium. Stress LVEF 60%. Low risk study.  Carotid artery duplex 12/06/2019:  Mild heterogeneous plaque noted in the left carotid artery.  Doppler flow velocities in the left internal carotid and common carotid  artery are consistent with stenosis in the range of 1-15% with mild heterogeneous plaque.  Antegrade right vertebral artery flow. Antegrade left vertebral artery flow.  Follow up studies appropriate if clinically indicated.  Echocardiogram 12/06/2019:  Left ventricle cavity is normal in size. Mild concentric hypertrophy of  the left ventricle. Normal LV systolic function with EF 55%. Normal global  wall motion. Doppler evidence of grade I  (impaired) diastolic dysfunction,  normal  LAP.  Trileaflet aortic valve. Mild to moderate aortic regurgitation.  Moderate (Grade II) mitral regurgitation.  Mild tricuspid regurgitation. Estimated pulmonary artery systolic pressure is 20 mmHg.  Lower Extremity Arterial Duplex 12/06/2019:  No hemodynamically significant stenoses are identified in the bilateral  lower extremity arterial system.   This exam reveals mildly decreased perfusion of the right lower extremity,  noted at the post tibial artery level (ABI 0.94) and mildly decreased  perfusion of the left lower extremity, noted at the post tibial artery  level (ABI 0.94).  Consider evaluation for pseudoclaudication.  Assessment     ICD-10-CM   1. Resistant hypertension  I10 amLODipine (NORVASC) 5 MG tablet    Basic metabolic panel    CBC    TSH  2. Hypercholesteremia  E78.00 ezetimibe (ZETIA) 10 MG tablet  3. Claudication of gluteal region ([place])  I73.9   4. Abdominal aortic stenosis CTA abd 02/04/2016  Q25.1   5. DOE (dyspnea on exertion)  R06.00     EKG 11/22/2019: Normal sinus rhythm at rate of 82 bpm, left atrial enlargement, left axis deviation, left anterior fascicular block.  Incomplete right bundle branch block.  Poor R wave progression, cannot exclude anteroseptal infarct old.  Nonspecific T abnormality.    Recommendations:   Meds ordered this encounter  Medications  . amLODipine (NORVASC) 5 MG tablet    Sig: Take 1 tablet (5 mg total) by mouth daily.    Dispense:  30 tablet    Refill:  2  . ezetimibe (ZETIA) 10 MG tablet    Sig: Take 1 tablet (10 mg total) by mouth daily.    Dispense:  30 tablet    Refill:  2    QUINTAVIS BRANDS  is a 79 y.o. Caucasian male with hypertension, hyperlipidemia, abdominal aortic stenosis with bilateral hip claudication, prior heavy Alcohol use, chronic dyspnea on exertion,  Presents for evaluation.  In view of his symptoms suggestive of claudication, I have recommended that we  proceed with arteriogram.  Since he has got resistant hypertension, I also suspect he probably has bilateral renal artery stenosis as recently after I started him on ARB, he developed hyponatremia and worsening renal function.  He is also tolerating metoprolol succinate 50 mg daily for hypertension and also presumed myocardial ischemia which he will continue along with starting amlodipine 5 mg daily.  Also amlodipine may help with this presumed dyspnea on exertion as anginal equivalent.  Extensive discussion was held regarding medical therapy versus proceeding with angiography both for claudication symptoms. He is tolerating Lipitor 40 mg once a day, LDL is still not at goal, will add Zetia 10 mg daily. Extensive discussion was also held regarding weight loss. Weight loss stressed again and gave positive reinforcement.    Will schedule for peripheral arteriogram and possible angioplasty given symptoms. Patient understands the risks, benefits, alternatives including medical therapy, CT angiography. Patient understands <1-2% risk of death, embolic complications, bleeding, infection, renal failure, urgent surgical revascularization, but not limited to these and wants to proceed.   Adrian Prows, MD, [facility] 02/05/2020, 7:04 PM [facility]. [place] Office: [phone]

## 2020-02-07 ENCOUNTER — Telehealth: Payer: Self-pay | Admitting: Pharmacist

## 2020-02-07 NOTE — Telephone Encounter (Signed)
Called regarding missing BP readings. Pt stated that he was checking his BP regularly. Asked pt to retest. Following the retest, all of the previous BP readings came through the system. BP readings reviewed. BP above goal. Pt states that he hasnt picked up his new amlodipine 5 mg tablets yet and wanted to discuss reportedly 'low' SBP reading of 127 with the provider first. Pt was worried that his BP was getting too low and wanted to make sure that the new medication wouldnt cause him to be hypotensive. Pt denies any signs of dizziness, lightheadedness, blurred vision. Reviewed ideal BP goals and signs and symptoms of hypotension with the pt. Encouraged pt to go to the pharmacy and pick up his CCB to provide further outcomes benefit. Pharmacist will continue to follow and review additional BP readings.   Pt also hasnt picked up the Zetia that was prescribed at last OV. Pt was unaware of the need to take both his atorvastatin and zetia together. Denied s/sx of myalgia. Relayed to the pt the synergist benefits of the two medication and the benefit in reducing ASCVD events. Pt agreeable to go the pharmacy and pick up the two medications and start taking them starting tomorrow. Pt verbalized understanding

## 2020-02-13 ENCOUNTER — Other Ambulatory Visit: Payer: Self-pay | Admitting: Cardiology

## 2020-02-13 DIAGNOSIS — I10 Essential (primary) hypertension: Secondary | ICD-10-CM

## 2020-02-16 ENCOUNTER — Other Ambulatory Visit (HOSPITAL_COMMUNITY)
Admission: RE | Admit: 2020-02-16 | Discharge: 2020-02-16 | Disposition: A | Discharge status: Home / Self Care | Payer: Medicare Other | Source: Ambulatory Visit | Attending: Cardiology | Admitting: Cardiology

## 2020-02-16 DIAGNOSIS — Z01812 Encounter for preprocedural laboratory examination: Secondary | ICD-10-CM | POA: Diagnosis present

## 2020-02-16 DIAGNOSIS — Z20822 Contact with and (suspected) exposure to covid-19: Secondary | ICD-10-CM | POA: Insufficient documentation

## 2020-02-16 LAB — SARS CORONAVIRUS 2 (TAT 6-24 HRS): SARS Coronavirus 2: NEGATIVE

## 2020-02-19 DIAGNOSIS — I739 Peripheral vascular disease, unspecified: Secondary | ICD-10-CM | POA: Diagnosis present

## 2020-02-20 ENCOUNTER — Encounter (HOSPITAL_COMMUNITY)
Admission: RE | Disposition: A | Discharge status: Home / Self Care | Payer: Self-pay | Source: Home / Self Care | Attending: Cardiology

## 2020-02-20 ENCOUNTER — Ambulatory Visit (HOSPITAL_COMMUNITY)
Admission: RE | Admit: 2020-02-20 | Discharge: 2020-02-20 | Disposition: A | Discharge status: Home / Self Care | Payer: Medicare Other | Attending: Cardiology | Admitting: Cardiology

## 2020-02-20 ENCOUNTER — Other Ambulatory Visit: Payer: Self-pay

## 2020-02-20 DIAGNOSIS — E7800 Pure hypercholesterolemia, unspecified: Secondary | ICD-10-CM | POA: Diagnosis not present

## 2020-02-20 DIAGNOSIS — I4510 Unspecified right bundle-branch block: Secondary | ICD-10-CM | POA: Diagnosis not present

## 2020-02-20 DIAGNOSIS — Z87891 Personal history of nicotine dependence: Secondary | ICD-10-CM | POA: Insufficient documentation

## 2020-02-20 DIAGNOSIS — Q251 Coarctation of aorta: Secondary | ICD-10-CM | POA: Insufficient documentation

## 2020-02-20 DIAGNOSIS — R0600 Dyspnea, unspecified: Secondary | ICD-10-CM | POA: Diagnosis not present

## 2020-02-20 DIAGNOSIS — I739 Peripheral vascular disease, unspecified: Secondary | ICD-10-CM | POA: Diagnosis not present

## 2020-02-20 DIAGNOSIS — M25551 Pain in right hip: Secondary | ICD-10-CM | POA: Insufficient documentation

## 2020-02-20 DIAGNOSIS — Z79899 Other long term (current) drug therapy: Secondary | ICD-10-CM | POA: Insufficient documentation

## 2020-02-20 DIAGNOSIS — Z8546 Personal history of malignant neoplasm of prostate: Secondary | ICD-10-CM | POA: Insufficient documentation

## 2020-02-20 DIAGNOSIS — M25552 Pain in left hip: Secondary | ICD-10-CM | POA: Insufficient documentation

## 2020-02-20 DIAGNOSIS — E785 Hyperlipidemia, unspecified: Secondary | ICD-10-CM | POA: Diagnosis not present

## 2020-02-20 DIAGNOSIS — Z8673 Personal history of transient ischemic attack (TIA), and cerebral infarction without residual deficits: Secondary | ICD-10-CM | POA: Diagnosis not present

## 2020-02-20 DIAGNOSIS — I10 Essential (primary) hypertension: Secondary | ICD-10-CM | POA: Insufficient documentation

## 2020-02-20 DIAGNOSIS — I700 Atherosclerosis of aorta: Secondary | ICD-10-CM | POA: Insufficient documentation

## 2020-02-20 DIAGNOSIS — Z923 Personal history of irradiation: Secondary | ICD-10-CM | POA: Diagnosis not present

## 2020-02-20 HISTORY — PX: LOWER EXTREMITY ANGIOGRAPHY: CATH118251

## 2020-02-20 HISTORY — PX: PERIPHERAL VASCULAR INTERVENTION: CATH118257

## 2020-02-20 LAB — BASIC METABOLIC PANEL
Anion gap: 8 (ref 5–15)
BUN: 18 mg/dL (ref 8–23)
CO2: 25 mmol/L (ref 22–32)
Calcium: 9 mg/dL (ref 8.9–10.3)
Chloride: 106 mmol/L (ref 98–111)
Creatinine, Ser: 1.27 mg/dL — ABNORMAL HIGH (ref 0.61–1.24)
GFR calc Af Amer: >60 mL/min (ref >60)
GFR calc non Af Amer: 54 mL/min — ABNORMAL LOW (ref >60)
Glucose, Bld: 104 mg/dL — ABNORMAL HIGH (ref 70–99)
Potassium: 4.4 mmol/L (ref 3.5–5.1)
Sodium: 139 mmol/L (ref 135–145)

## 2020-02-20 LAB — CBC
HCT: 40.7 % (ref 39.0–52.0)
Hemoglobin: 13.4 g/dL (ref 13.0–17.0)
MCH: 31.5 pg (ref 26.0–34.0)
MCHC: 32.9 g/dL (ref 30.0–36.0)
MCV: 95.8 fL (ref 80.0–100.0)
Platelets: 254 10*3/uL (ref 150–400)
RBC: 4.25 MIL/uL (ref 4.22–5.81)
RDW: 13 % (ref 11.5–15.5)
WBC: 9.9 10*3/uL (ref 4.0–10.5)
nRBC: 0 % (ref 0.0–0.2)

## 2020-02-20 LAB — POCT ACTIVATED CLOTTING TIME
Activated Clotting Time: 213 seconds
Activated Clotting Time: 219 seconds

## 2020-02-20 SURGERY — LOWER EXTREMITY ANGIOGRAPHY
Anesthesia: LOCAL

## 2020-02-20 MED ORDER — CLOPIDOGREL BISULFATE 300 MG PO TABS
ORAL_TABLET | ORAL | Status: DC | PRN
  Administered 2020-02-20 (×2): 300 mg via ORAL

## 2020-02-20 MED ORDER — SODIUM CHLORIDE 0.9 % IV SOLN: 250.0000 mL | INTRAVENOUS | Status: DC | PRN

## 2020-02-20 MED ORDER — NITROGLYCERIN 1 MG/10 ML FOR IR/CATH LAB
INTRA_ARTERIAL | Status: DC | PRN
  Administered 2020-02-20: 400 mL via INTRA_ARTERIAL

## 2020-02-20 MED ORDER — FENTANYL CITRATE (PF) 100 MCG/2ML IJ SOLN
INTRAMUSCULAR | Status: DC | PRN
  Administered 2020-02-20 (×2): 50 ug via INTRAVENOUS
  Administered 2020-02-20: 25 ug via INTRAVENOUS
  Administered 2020-02-20: 50 ug via INTRAVENOUS

## 2020-02-20 MED ORDER — MIDAZOLAM HCL 2 MG/2ML IJ SOLN
INTRAMUSCULAR | Status: AC
  Filled 2020-02-20: qty 2

## 2020-02-20 MED ORDER — HEPARIN SODIUM (PORCINE) 1000 UNIT/ML IJ SOLN
INTRAMUSCULAR | Status: AC
  Filled 2020-02-20: qty 1

## 2020-02-20 MED ORDER — SODIUM CHLORIDE 0.9 % WEIGHT BASED INFUSION
3.0000 mL/kg/h | INTRAVENOUS | Status: AC
  Administered 2020-02-20: 3 mL/kg/h via INTRAVENOUS

## 2020-02-20 MED ORDER — ASPIRIN 81 MG PO CHEW: 81.0000 mg | CHEWABLE_TABLET | ORAL | Status: DC

## 2020-02-20 MED ORDER — IODIXANOL 320 MG/ML IV SOLN
INTRAVENOUS | Status: DC | PRN
  Administered 2020-02-20: 130 mL via INTRA_ARTERIAL

## 2020-02-20 MED ORDER — SODIUM CHLORIDE 0.9 % WEIGHT BASED INFUSION: 1.0000 mL/kg/h | INTRAVENOUS | Status: DC

## 2020-02-20 MED ORDER — LIDOCAINE HCL (PF) 1 % IJ SOLN
INTRAMUSCULAR | Status: DC | PRN
  Administered 2020-02-20 (×2): 20 mL

## 2020-02-20 MED ORDER — SODIUM CHLORIDE 0.9% FLUSH: 3.0000 mL | Freq: Two times a day (BID) | INTRAVENOUS | Status: DC

## 2020-02-20 MED ORDER — HEPARIN SODIUM (PORCINE) 1000 UNIT/ML IJ SOLN
INTRAMUSCULAR | Status: DC | PRN
  Administered 2020-02-20 (×2): 3000 [IU] via INTRAVENOUS
  Administered 2020-02-20: 8000 [IU] via INTRAVENOUS

## 2020-02-20 MED ORDER — SODIUM CHLORIDE 0.9% FLUSH: 3.0000 mL | INTRAVENOUS | Status: DC | PRN

## 2020-02-20 MED ORDER — LIDOCAINE HCL (PF) 1 % IJ SOLN
INTRAMUSCULAR | Status: AC
  Filled 2020-02-20: qty 30

## 2020-02-20 MED ORDER — FENTANYL CITRATE (PF) 100 MCG/2ML IJ SOLN
INTRAMUSCULAR | Status: AC
  Filled 2020-02-20: qty 2

## 2020-02-20 MED ORDER — HEPARIN (PORCINE) IN NACL 1000-0.9 UT/500ML-% IV SOLN
INTRAVENOUS | Status: AC
  Filled 2020-02-20: qty 500

## 2020-02-20 MED ORDER — HEPARIN (PORCINE) IN NACL 1000-0.9 UT/500ML-% IV SOLN
INTRAVENOUS | Status: DC | PRN
  Administered 2020-02-20 (×2): 500 mL

## 2020-02-20 MED ORDER — MIDAZOLAM HCL 2 MG/2ML IJ SOLN
INTRAMUSCULAR | Status: DC | PRN
  Administered 2020-02-20: 1 mg via INTRAVENOUS
  Administered 2020-02-20 (×2): 2 mg via INTRAVENOUS

## 2020-02-20 MED ORDER — CLOPIDOGREL BISULFATE 75 MG PO TABS: 75.0000 mg | ORAL_TABLET | Freq: Every day | ORAL | 5 refills | Status: DC

## 2020-02-20 MED ORDER — CLOPIDOGREL BISULFATE 300 MG PO TABS
ORAL_TABLET | ORAL | Status: AC
  Filled 2020-02-20: qty 1

## 2020-02-20 MED ORDER — SODIUM CHLORIDE 0.9 % WEIGHT BASED INFUSION
1.0000 mL/kg/h | INTRAVENOUS | Status: DC
  Administered 2020-02-20: 1 mL/kg/h via INTRAVENOUS

## 2020-02-20 MED FILL — CLOPIDOGREL 75 MG TABLET: 75 | 30 days supply | Qty: 30 | Fill #0

## 2020-02-20 SURGICAL SUPPLY — 29 items
BALLN ATLAS 14X40X75 (BALLOONS) ×2
BALLN MUSTANG 12.0X40 75 (BALLOONS) ×2
BALLN MUSTANG 12X20X75 (BALLOONS) ×2
BALLOON ATLAS 14X40X75 (BALLOONS) IMPLANT
BALLOON MUSTANG 12.0X40 75 (BALLOONS) IMPLANT
BALLOON MUSTANG 12X20X75 (BALLOONS) IMPLANT
CATH ANGIO 5F PIGTAIL 65CM (CATHETERS) ×1 IMPLANT
CATH SHOCKWAVE 7.0X60 (CATHETERS) ×1 IMPLANT
CATH STRAIGHT 5FR 65CM (CATHETERS) ×1 IMPLANT
CLOSURE MYNX CONTROL 6F/7F (Vascular Products) ×2 IMPLANT
DEVICE TORQUE .025-.038 (MISCELLANEOUS) ×1 IMPLANT
GUIDEWIRE ANGLED .035X150CM (WIRE) ×1 IMPLANT
KIT ENCORE 26 ADVANTAGE (KITS) ×1 IMPLANT
KIT MICROPUNCTURE NIT STIFF (SHEATH) ×1 IMPLANT
KIT PV (KITS) ×2 IMPLANT
SHEATH BRITE TIP 7FR 35CM (SHEATH) ×1 IMPLANT
SHEATH PINNACLE 5F 10CM (SHEATH) ×1 IMPLANT
SHEATH PINNACLE 6F 10CM (SHEATH) ×1 IMPLANT
SHEATH PINNACLE 7F 10CM (SHEATH) ×1 IMPLANT
SHEATH PROBE COVER 6X72 (BAG) ×2 IMPLANT
STENT OMNILINK ELITE 10X29X80 (Permanent Stent) ×1 IMPLANT
SYR MEDRAD MARK 7 150ML (SYRINGE) ×2 IMPLANT
TAPE VIPERTRACK RADIOPAQ (MISCELLANEOUS) IMPLANT
TAPE VIPERTRACK RADIOPAQUE (MISCELLANEOUS) ×2
TRANSDUCER W/STOPCOCK (MISCELLANEOUS) ×2 IMPLANT
TRAY PV CATH (CUSTOM PROCEDURE TRAY) ×2 IMPLANT
WIRE BENTSON .035X145CM (WIRE) ×1 IMPLANT
WIRE SPARTACORE .014X190CM (WIRE) ×1 IMPLANT
WIRE SPARTACORE .014X300CM (WIRE) ×1 IMPLANT

## 2020-02-20 NOTE — Progress Notes (Signed)
Pt noted to become pale with lowered heart rate and blood pressure while holding manual pressure to right groin. Placed in Trendelenburg and monitored patient until improvement noted.

## 2020-02-20 NOTE — Discharge Instructions (Signed)
Femoral Site Care This sheet gives you information about how to care for yourself after your procedure. Your health care provider may also give you more specific instructions. If you have problems or questions, contact your health care provider. What can I expect after the procedure? After the procedure, it is common to have:  Bruising that usually fades within 1-2 weeks.  Tenderness at the site. Follow these instructions at home: Wound care  Follow instructions from your health care provider about how to take care of your insertion site. Make sure you: ? Wash your hands with soap and water before you change your bandage (dressing). If soap and water are not available, use hand sanitizer. ? Change your dressing as told by your health care provider. ? Leave stitches (sutures), skin glue, or adhesive strips in place. These skin closures may need to stay in place for 2 weeks or longer. If adhesive strip edges start to loosen and curl up, you may trim the loose edges. Do not remove adhesive strips completely unless your health care provider tells you to do that.  Do not take baths, swim, or use a hot tub until your health care provider approves.  You may shower 24-48 hours after the procedure or as told by your health care provider. ? Gently wash the site with plain soap and water. ? Pat the area dry with a clean towel. ? Do not rub the site. This may cause bleeding.  Do not apply powder or lotion to the site. Keep the site clean and dry.  Check your femoral site every day for signs of infection. Check for: ? Redness, swelling, or pain. ? Fluid or blood. ? Warmth. ? Pus or a bad smell. Activity  For the first 2-3 days after your procedure, or as long as directed: ? Avoid climbing stairs as much as possible. ? Do not squat.  Do not lift anything that is heavier than 10 lb (4.5 kg), or the limit that you are told, until your health care provider says that it is safe.  Rest as  directed. ? Avoid sitting for a long time without moving. Get up to take short walks every 1-2 hours.  Do not drive for 24 hours if you were given a medicine to help you relax (sedative). General instructions  Take over-the-counter and prescription medicines only as told by your health care provider.  Keep all follow-up visits as told by your health care provider. This is important. Contact a health care provider if you have:  A fever or chills.  You have redness, swelling, or pain around your insertion site. Get help right away if:  The catheter insertion area swells very fast.  You pass out.  You suddenly start to sweat or your skin gets clammy.  The catheter insertion area is bleeding, and the bleeding does not stop when you hold steady pressure on the area.  The area near or just beyond the catheter insertion site becomes pale, cool, tingly, or numb. These symptoms may represent a serious problem that is an emergency. Do not wait to see if the symptoms will go away. Get medical help right away. Call your local emergency services (911 in the [place] Do not drive yourself to the hospital. Summary  After the procedure, it is common to have bruising that usually fades within 1-2 weeks.  Check your femoral site every day for signs of infection.  Do not lift anything that is heavier than 10 lb (4.5 kg), or the   limit that you are told, until your health care provider says that it is safe. This information is not intended to replace advice given to you by your health care provider. Make sure you discuss any questions you have with your health care provider. Document Revised: 11/29/2017 Document Reviewed: 11/29/2017 Elsevier Patient Education  2020 [employer]  

## 2020-02-20 NOTE — CV Procedure (Signed)
Shockwave lithotrypsy 7 mm balloon angioplasty followed by step up balloon angioplasty with 10 12x40 Mustang 12x20 Mustang  and 14x40 mm Atlas ballonn used followed by stenting with Omnilink Elite 10x29 mm  Balloon expandable stent.  80% to <10 %. 40 mm Hg pressure gradient to 0 mm Hg PG.

## 2020-02-20 NOTE — Interval H&P Note (Signed)
History and Physical Interval Note:  02/20/2020 7:35 AM  Matthew Hodges  has presented today for surgery, with the diagnosis of pad - hp.  The various methods of treatment have been discussed with the patient and family. After consideration of risks, benefits and other options for treatment, the patient has consented to  Procedure(s): LOWER EXTREMITY ANGIOGRAPHY (N/A) RENAL ANGIOGRAPHY (N/A) as a surgical intervention.  The patient's history has been reviewed, patient examined, no change in status, stable for surgery.  I have reviewed the patient's chart and labs.  Questions were answered to the patient's satisfaction.     [place]

## 2020-02-21 ENCOUNTER — Telehealth: Payer: Self-pay

## 2020-02-21 DIAGNOSIS — I739 Peripheral vascular disease, unspecified: Secondary | ICD-10-CM

## 2020-02-21 NOTE — Telephone Encounter (Signed)
ABI ECHO has been sch for 3/30 @ 2pm  Nurse visit for this Friday 3/26 at 12:15pm   If patient cannot make it to any of these appts please send patient to me.    Thank you.  Leda Quail

## 2020-02-21 NOTE — Telephone Encounter (Signed)
Patient called and said that he is having a reaction to the plavix, hives and a fever. Also he thinks his BP is to low 108/61 and his pulse it to high at 79.

## 2020-02-21 NOTE — Telephone Encounter (Signed)
Keep at it. And he can come for a quick check, just to see the skin but not a visit until previously scheduled.  I also placed an order for ABI to be done within 30 days after his angioplasty yesterday

## 2020-02-23 ENCOUNTER — Encounter: Payer: Self-pay | Admitting: Cardiology

## 2020-02-23 ENCOUNTER — Other Ambulatory Visit: Payer: Self-pay

## 2020-02-23 ENCOUNTER — Ambulatory Visit: Payer: Medicare Other | Admitting: Cardiology

## 2020-02-23 VITALS — BP 149/64 | HR 70 | Ht 68.0 in | Wt 200.0 lb

## 2020-02-23 DIAGNOSIS — I739 Peripheral vascular disease, unspecified: Secondary | ICD-10-CM

## 2020-02-23 DIAGNOSIS — I10 Essential (primary) hypertension: Secondary | ICD-10-CM

## 2020-02-23 DIAGNOSIS — Q251 Coarctation of aorta: Secondary | ICD-10-CM

## 2020-02-23 MED ORDER — CLOPIDOGREL BISULFATE 75 MG PO TABS: 75.0000 mg | ORAL_TABLET | Freq: Every day | ORAL | 5 refills | Status: AC

## 2020-02-23 MED ORDER — METOPROLOL SUCCINATE ER 100 MG PO TB24: 100.0000 mg | ORAL_TABLET | Freq: Every day | ORAL | 3 refills | Status: DC

## 2020-02-23 NOTE — Progress Notes (Signed)
Primary Physician/Referring:  London Pepper, MD  Patient ID: Matthew Hodges, male    DOB: February 20, 1941, 79 y.o.   MRN: [id number]  No chief complaint on file.  HPI:    Matthew Hodges  is a 79 y.o. Matthew Hodges  is a 79 y.o. Caucasian male with hypertension, hyperlipidemia, abdominal aortic stenosis with bilateral hip claudication, prior heavy Alcohol use, chronic dyspnea on exertion, underwent successful lithotripsy followed by stenting of distal abdominal aorta on 01/31/2020. Had noticed rash after his angiogram and I brought him in for evaluation.  There was suspicion for renal artery stenosis as his renal function had gotten worse when starting ACE inhibitor/ARB, losartan.  He is tolerating all his medications, he has stopped using Plavix.  States that his symptoms of claudication are improved since angioplasty but he has not resumed full activity.  Dyspnea has remained stable.     Past Medical History:  Diagnosis Date  . Aortic atherosclerosis ([place]) 11/10/2017   noted on CT abd/pelvis  . Aortic stenosis 2012  . Basal cell carcinoma    "sqattered"  . BBB (bundle branch block) 04/17/2015   Right   . Bilateral inguinal hernia 11/10/2017   possible small, noted on CT abd/pelvis  . Bilateral inguinal hernia (BIH) s/p lap repair w mesh 12/24/2017 12/24/2017  . Cerebral artery disease   . Claudication of both lower extremities ([place])   . Diverticular disease   . Frequent urination   . Hearing impaired    bilateral hearing aids  . Hearing loss   . Heart murmur    "after surgeries before"  . 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 Hyperlipidemia   . Hypertension    hx. of . no meds since 2004  . Left femoral hernia s/p lap repair w mesh 12/24/2017 12/24/2017  . Left obturator hernia s/p lap repair w mesh 12/24/2017 12/24/2017  . Prostate cancer [facility])    treated with radiation  . Recurrent ventral incisional hernia s/p lap re-repair w mesh May 2016  04/23/2015  . Right acoustic neuroma ([place])   . SBO (small bowel obstruction) ([place]) 03/2016   last 04-21-16 admission([facility])"multiple occurrences"  . Squamous carcinoma    "sqattered"Scalp 1 polyethelene skin prostesis left frontal scalp area-removable.  . Stroke ([place]) 1979   no deficits  . Testicular cyst    history of right  . Wears dentures    upper and lower  . Wears glasses   . Wears hearing aid in both ears    Past Surgical History:  Procedure Laterality Date  . COLECTOMY WITH COLOSTOMY CREATION/HARTMANN PROCEDURE  01/1999   Hartmans procedure with colostomy  . COLONOSCOPY    . COLOSTOMY TAKEDOWN  08/1999  . INCISIONAL HERNIA REPAIR  2001   open w mesh  . INGUINAL HERNIA REPAIR N/A 12/24/2017   Procedure: LAPAROSCOPIC  BILATERAL INGUINAL HERNIA REPAIR WITH MESH, LYSIS OF ADHESIONS;  Surgeon: Michael Boston, MD;  Location: [facility];  Service: General;  Laterality: N/A;  . LAPAROSCOPIC INCISIONAL / UMBILICAL / [place]  2008   with mesh.  LLQ old colostomy site  . LAPAROSCOPIC LYSIS OF ADHESIONS N/A 06/25/2016   Procedure: LAPAROSCOPIC LYSIS OF ADHESIONS;  Surgeon: Michael Boston, MD;  Location: [facility];  Service: General;  Laterality: N/A;  . LOWER EXTREMITY ANGIOGRAPHY N/A 02/20/2020   Procedure: LOWER EXTREMITY ANGIOGRAPHY;  Surgeon: Adrian Prows, MD;  Location: [facility];  Service: Cardiovascular;  Laterality:  N/A;  . LYSIS OF ADHESION N/A 04/23/2015   Procedure: LYSIS OF ADHESION;  Surgeon: Michael Boston, MD;  Location: [facility];  Service: General;  Laterality: N/A;  . MOHS SURGERY  02/2017  . PERIPHERAL VASCULAR INTERVENTION N/A 02/20/2020   Procedure: PERIPHERAL VASCULAR INTERVENTION;  Surgeon: Adrian Prows, MD;  Location: [facility];  Service: Cardiovascular;  Laterality: N/A;  Abdominal  . PROSTATE BIOPSY  ~ 2002  . REMOVE SUTURE UNDER ANESTHESIA  2011   stitch abscesses LLQ & midline  . SMALL BOWEL REPAIR  03/1999   postop abscesses with  fistula.  Abscesses drained.  Fistula repaired.  . TESTICLE SURGERY Right 1998  . VENTRAL HERNIA REPAIR N/A 04/23/2015   Procedure: LAPAROSCOPIC VENTRAL WALL HERNIA REPAIR AND LAP LYSIS OF ADHESIONS;  Surgeon: Michael Boston, MD;  Location: [facility];  Service: General;  Laterality: N/A;  With MESH  . VENTRAL HERNIA REPAIR N/A 12/29/2018   Procedure: LAPAROSCOPIC LYSIS OF ADHESIONS X2.5 HOURS, OPEN LYSIS OF ADHESIONS X1 HOUR, laparoscopic REPAIR OF INCARCERATED INCISIONAL HERNIA WITH MESH;  Surgeon: Michael Boston, MD;  Location: [facility];  Service: General;  Laterality: N/A;   Social History   Tobacco Use  . Smoking status: Former Smoker    Packs/day: 2.00    Years: 47.00    Pack years: 94.00    Types: Cigarettes    Quit date: 11/30/2004    Years since quitting: 15.2  . Smokeless tobacco: Never Used  Substance Use Topics  . Alcohol use: No    Comment: Quit-recovering alcoholic sober since 5284    ROS  Review of Systems  Cardiovascular: Positive for dyspnea on exertion. Negative for chest pain, claudication and leg swelling.  Skin: Positive for itching and rash.  Gastrointestinal: Negative for melena.   Objective  Blood pressure (!) 149/64, pulse 70, height _0  (1.727 m), weight 200 lb (90.7 kg), SpO2 97 %.  Vitals with BMI 02/23/2020 02/23/2020 02/20/2020  Height - _1  -  Weight - 200 lbs -  BMI - 13.24 -  Systolic 401 027 253  Diastolic 64 65 67  Pulse 70 72 66     Physical Exam  Constitutional: He appears well-developed and well-nourished.  HENT:  Head: Atraumatic.  Cardiovascular: Normal rate, regular rhythm, normal heart sounds and intact distal pulses. Exam reveals no gallop.  No murmur heard. Pulses:      Carotid pulses are on the left side with bruit.      Femoral pulses are 2+ on the right side and 2+ on the left side.      Dorsalis pedis pulses are 2+ on the right side and 2+ on the left side.       Posterior tibial pulses are 1+ on the right side and 1+ on the left  side.  No leg edema, no JVD.  Right groin ecchymosis, no hematoma  Pulmonary/Chest: [place] normal and breath sounds normal.  Abdominal: Soft. Bowel sounds are normal.   Laboratory examination:   Recent Labs    01/23/20 0855 02/20/20 0602  NA 138 139  K 5.3* 4.4  CL 98 106  CO2 23 25  GLUCOSE 100* 104*  BUN 16 18  CREATININE 1.24 1.27*  CALCIUM 9.9 9.0  GFRNONAA 55* 54*  GFRAA 64 >60   estimated creatinine clearance is 52.4 mL/min (A) (by C-G formula based on SCr of 1.27 mg/dL (H)).  CMP Latest Ref Rng & Units 02/20/2020 01/23/2020 12/24/2017  Glucose 70 - 99 mg/dL 104(H)  100(H) 126(H)  BUN 8 - 23 mg/dL 18 16 -  Creatinine 0.61 - 1.24 mg/dL 1.27(H) 1.24 -  Sodium 135 - 145 mmol/L 139 138 141  Potassium 3.5 - 5.1 mmol/L 4.4 5.3(H) 4.1  Chloride 98 - 111 mmol/L 106 98 -  CO2 22 - 32 mmol/L 25 23 -  Calcium 8.9 - 10.3 mg/dL 9.0 9.9 -  Total Protein 6.0 - 8.5 g/dL - 7.0 -  Total Bilirubin 0.0 - 1.2 mg/dL - 0.4 -  Alkaline Phos 39 - 117 IU/L - 89 -  AST 0 - 40 IU/L - 23 -  ALT 0 - 44 IU/L - 19 -   CBC Latest Ref Rng & Units 02/20/2020 12/28/2018 12/24/2017  WBC 4.0 - 10.5 K/uL 9.9 7.7 -  Hemoglobin 13.0 - 17.0 g/dL 13.4 13.5 13.6  Hematocrit 39.0 - 52.0 % 40.7 41.4 40.0  Platelets 150 - 400 K/uL 254 268 -  . Lipid Panel     Component Value Date/Time   CHOL 174 01/23/2020 0854   TRIG 208 (H) 01/23/2020 0854   HDL 34 (L) 01/23/2020 0854   LDLCALC 104 (H) 01/23/2020 0854   HEMOGLOBIN A1C Lab Results  Component Value Date   HGBA1C 5.9 (H) 02/04/2016   MPG 123 02/04/2016   TSH No results for input(s): TSH in the last 8760 hours.  Labs 11/10/2018: Serum glucose 93 mg, BUN 18, EGFR >60 mL, CMP normal.  Total cholesterol 156, triglycerides 171, HDL 36, LDL 85.  PSA normal.  Medications and allergies   Allergies  Allergen Reactions  . Losartan Other (See Comments)    Ranl failure  . Latex Rash    Prolong contact  Bandages     Current Outpatient Medications    Medication Instructions  . amLODipine (NORVASC) 5 mg, Oral, Daily  . aspirin EC 81 mg, Oral, Daily  . atorvastatin (LIPITOR) 40 mg, Oral, Daily at bedtime  . clopidogrel (PLAVIX) 75 mg, Oral, Daily  . Co Q-10 300 mg, Oral, Daily  . docusate sodium (COLACE) 100 mg, Oral, Daily at bedtime  . ezetimibe (ZETIA) 10 mg, Oral, Daily  . Fish Oil 1,200 mg, Oral, Daily  . melatonin 5 mg, Oral, Daily at bedtime  . metoprolol succinate (TOPROL-XL) 100 mg, Oral, Daily, Take with or immediately following a meal.  . mirabegron ER (MYRBETRIQ) 25 mg, Oral, Daily  . Multiple Vitamins-Minerals (MEGA MULTIVITAMIN FOR MEN PO) 1 tablet, Oral,  Every morning - 10a  . Vitamin D3 400 Units, Oral, Daily    Radiology:  No results found.  Cardiac Studies:   Echocardiogram 10/15/2016:  Normal LV systolic function, EF 38-46%.  Mild LVH, grade 1 diastolic dysfunction.  Bilateral lower extremity segmental pressures 02/02/2017: 1. Normal bilateral resting ankle-brachial indices, arterial waveforms, pulse volume recordings and segmental pressures. 2. However, correlation with prior CT imaging demonstrates bulky calcified atherosclerotic plaque in the infrarenal abdominal aorta with a coral-reef configuration resulting in significant narrowing of the luminal diameter consistent with inflow disease. This could explain the patient's clinical symptoms of buttock/hip and upper thigh claudication.  Lexiscan Tetrofosmin stress test 11/29/2019: No previous exam available for comparison. Lexiscan/walking nuclear stress test performed using 1-day protocol. Stress EKG is non-diagnostic, as this is pharmacological stress test. Stress EKG at 77% MPHR showed sinus tachycardia, RBBB.  SPECT imaging show small sized, mild intensity, predominantly reversible perfusion defect in basal inferior myocardium. Stress LVEF 60%. Low risk study.  Carotid artery duplex 12/06/2019:  Mild heterogeneous plaque noted in the  left carotid  artery.  Doppler flow velocities in the left internal carotid and common carotid  artery are consistent with stenosis in the range of 1-15% with mild heterogeneous plaque.  Antegrade right vertebral artery flow. Antegrade left vertebral artery flow.  Follow up studies appropriate if clinically indicated.  Echocardiogram 12/06/2019:  Left ventricle cavity is normal in size. Mild concentric hypertrophy of  the left ventricle. Normal LV systolic function with EF 55%. Normal global  wall motion. Doppler evidence of grade I (impaired) diastolic dysfunction,  normal LAP.  Trileaflet aortic valve. Mild to moderate aortic regurgitation.  Moderate (Grade II) mitral regurgitation.  Mild tricuspid regurgitation. Estimated pulmonary artery systolic pressure is 20 mmHg.  Lower Extremity Arterial Duplex 12/06/2019:  No hemodynamically significant stenoses are identified in the bilateral  lower extremity arterial system.   This exam reveals mildly decreased perfusion of the right lower extremity,  noted at the post tibial artery level (ABI 0.94) and mildly decreased  perfusion of the left lower extremity, noted at the post tibial artery  level (ABI 0.94).  Consider evaluation for pseudoclaudication.   Peripheral arteriogram 02/20/20  Abdominal aortogram, abdominal angiogram with limited bifemoral arteriogram. Lithotripsy of the abdominal aortic stenosis followed by  PTA and stenting with 10.0 x 39 mm Omnilink Elite balloon expandable stent.  Angiographic data: There was diffuse atherosclerotic changes noted throughout the bilateral femoral arteries by ultrasound guidance.  There was severe calcification of the abdominal aorta.  There is mild tortuosity.  There was no abdominal aortic aneurysm.  There was a heavily calcific at least a  70% stenosis in the distal abdominal aorta.  The aortoiliac bifurcation was widely patent with mild calcification and mild disease.  Bilateral common femoral arteries  were widely patent.  Bilateral renal arteries were widely patent.  Intervention: Successful lithotripsy of the calcific distal abdominal aortic stenosis followed by stenting with balloon expandable 10.0 x 39 mm Omnilink Elite balloon expandable stent which was postdilated with a 14 mm Atlas balloon.  70% stenosis reduced to less than 10%, 40 mmHg pressure gradient reduced to 0 mmHg gradient. A total of 130 mL contrast utilized.  Assessment     ICD-10-CM   1. Claudication of gluteal region ([place])  I73.9 clopidogrel (PLAVIX) 75 MG tablet    CANCELED: PCV ANKLE BRACHIAL INDEX (ABI)  2. Abdominal aortic stenosis CTA abd 02/04/2016  Q25.1 clopidogrel (PLAVIX) 75 MG tablet  3. Essential hypertension  I10 metoprolol succinate (TOPROL-XL) 100 MG 24 hr tablet  4. Primary hypertension  I10 metoprolol succinate (TOPROL-XL) 100 MG 24 hr tablet    EKG 11/22/2019: Normal sinus rhythm at rate of 82 bpm, left atrial enlargement, left axis deviation, left anterior fascicular block.  Incomplete right bundle branch block.  Poor R wave progression, cannot exclude anteroseptal infarct old.  Nonspecific T abnormality.    Recommendations:   Meds ordered this encounter  Medications  . metoprolol succinate (TOPROL-XL) 100 MG 24 hr tablet    Sig: Take 1 tablet (100 mg total) by mouth daily. Take with or immediately following a meal.    Dispense:  90 tablet    Refill:  3  . clopidogrel (PLAVIX) 75 MG tablet    Sig: Take 1 tablet (75 mg total) by mouth daily.    Dispense:  30 tablet    Refill:  5    Matthew Hodges  is a 79 y.o. Caucasian male with hypertension, hyperlipidemia, abdominal aortic stenosis with bilateral hip claudication, prior heavy Alcohol use, chronic  dyspnea on exertion, underwent successful lithotripsy followed by stenting of distal abdominal aorta on 01/31/2020. Had noticed rash after his angiogram and I brought him in for evaluation.  He underwent peripheral arteriogram and had heavily  calcified high-grade distal abdominal aortic stenosis, successful lithotripsy followed by stenting.  He has noticed improvement in symptoms of claudication but he has not fully resumed his activity, advised him to increase his activity.   He is also tolerating metoprolol succinate 50 mg daily for hypertension and also presumed myocardial ischemia which he will continue along with starting amlodipine 5 mg daily. Blood pressure is elevated, will increase metoprolol succinate from 50 mg to 100 mg daily.  He will obtain ABI for PAD evaluation and establishing baseline and I will see him back in 3 months for follow-up of PAD, hypertension he did not have renal artery stenosis. His rash may have been related to either contrast or it could have been related to Plavix however advised him to restart Plavix and to inform me if he has recurrence of rash and itching.  He has not had any complications from the procedure.  Adrian Prows, MD, [facility] 02/23/2020, 12:55 PM [facility] [place] Office: [phone]

## 2020-02-23 NOTE — Telephone Encounter (Signed)
Just following up- did u call patient?

## 2020-02-27 ENCOUNTER — Other Ambulatory Visit: Payer: Self-pay

## 2020-02-27 ENCOUNTER — Ambulatory Visit: Payer: Medicare Other

## 2020-02-27 DIAGNOSIS — I739 Peripheral vascular disease, unspecified: Secondary | ICD-10-CM

## 2020-03-05 ENCOUNTER — Ambulatory Visit: Payer: Medicare Other | Admitting: Cardiology

## 2020-04-01 ENCOUNTER — Other Ambulatory Visit: Payer: Self-pay | Admitting: Cardiology

## 2020-05-02 ENCOUNTER — Other Ambulatory Visit: Payer: Self-pay | Admitting: Cardiology

## 2020-05-02 DIAGNOSIS — E7800 Pure hypercholesterolemia, unspecified: Secondary | ICD-10-CM

## 2020-05-02 DIAGNOSIS — I10 Essential (primary) hypertension: Secondary | ICD-10-CM

## 2020-05-13 ENCOUNTER — Ambulatory Visit: Payer: Medicare Other | Admitting: Cardiology

## 2020-05-13 ENCOUNTER — Other Ambulatory Visit: Payer: Self-pay

## 2020-05-13 ENCOUNTER — Encounter: Payer: Self-pay | Admitting: Cardiology

## 2020-05-13 VITALS — BP 138/61 | HR 62 | Resp 16 | Ht 68.0 in | Wt 203.6 lb

## 2020-05-13 DIAGNOSIS — I739 Peripheral vascular disease, unspecified: Secondary | ICD-10-CM

## 2020-05-13 DIAGNOSIS — I10 Essential (primary) hypertension: Secondary | ICD-10-CM

## 2020-05-13 DIAGNOSIS — Q251 Coarctation of aorta: Secondary | ICD-10-CM

## 2020-05-13 DIAGNOSIS — E7800 Pure hypercholesterolemia, unspecified: Secondary | ICD-10-CM

## 2020-05-13 NOTE — Progress Notes (Signed)
Primary Physician/Referring:  London Pepper, MD  Patient ID: Matthew Hodges, male    DOB: 1941-07-26, 79 y.o.   MRN: [id number]  Chief Complaint  Patient presents with  . Follow-up    3 month  . Claudication  . Hypertension   HPI:    Matthew Hodges  is a 79 y.o. Matthew Hodges  is a 79 y.o. Caucasian male with hypertension, hyperlipidemia, abdominal aortic stenosis with bilateral hip claudication, prior heavy Alcohol use, chronic dyspnea on exertion, underwent successful lithotripsy followed by stenting of distal abdominal aorta on 01/31/2020. Had noticed rash after his angiogram and I brought him in for evaluation.  States that his symptoms of claudication are improved since angioplasty, he has now been walking for 1-2 miles a day.  Dyspnea has remained stable.  He also noticed his blood pressure has stabilized and improved.  Overall feeling well.  Is tolerating dual antiplatelet therapy without bleeding complications.  Past Medical History:  Diagnosis Date  . Aortic atherosclerosis ([place]) 11/10/2017   noted on CT abd/pelvis  . Aortic stenosis 2012  . Basal cell carcinoma    "sqattered"  . BBB (bundle branch block) 04/17/2015   Right   . Bilateral inguinal hernia 11/10/2017   possible small, noted on CT abd/pelvis  . Bilateral inguinal hernia (BIH) s/p lap repair w mesh 12/24/2017 12/24/2017  . Cerebral artery disease   . Claudication of both lower extremities ([place])   . Diverticular disease   . Frequent urination   . Hearing impaired    bilateral hearing aids  . Hearing loss   . Heart murmur    "after surgeries before"  . 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 Hyperlipidemia   . Hypertension    hx. of . no meds since 2004  . Left femoral hernia s/p lap repair w mesh 12/24/2017 12/24/2017  . Left obturator hernia s/p lap repair w mesh 12/24/2017 12/24/2017  . Prostate cancer [facility])    treated with radiation  . Recurrent ventral incisional hernia  s/p lap re-repair w mesh May 2016 04/23/2015  . Right acoustic neuroma ([place])   . SBO (small bowel obstruction) ([place]) 03/2016   last 04-21-16 admission([facility])"multiple occurrences"  . Squamous carcinoma    "sqattered"Scalp 1 polyethelene skin prostesis left frontal scalp area-removable.  . Stroke ([place]) 1979   no deficits  . Testicular cyst    history of right  . Wears dentures    upper and lower  . Wears glasses   . Wears hearing aid in both ears    Past Surgical History:  Procedure Laterality Date  . COLECTOMY WITH COLOSTOMY CREATION/HARTMANN PROCEDURE  01/1999   Hartmans procedure with colostomy  . COLONOSCOPY    . COLOSTOMY TAKEDOWN  08/1999  . INCISIONAL HERNIA REPAIR  2001   open w mesh  . INGUINAL HERNIA REPAIR N/A 12/24/2017   Procedure: LAPAROSCOPIC  BILATERAL INGUINAL HERNIA REPAIR WITH MESH, LYSIS OF ADHESIONS;  Surgeon: Michael Boston, MD;  Location: [place];  Service: General;  Laterality: N/A;  . LAPAROSCOPIC INCISIONAL / UMBILICAL / [place]  2008   with mesh.  LLQ old colostomy site  . LAPAROSCOPIC LYSIS OF ADHESIONS N/A 06/25/2016   Procedure: LAPAROSCOPIC LYSIS OF ADHESIONS;  Surgeon: Michael Boston, MD;  Location: [facility];  Service: General;  Laterality: N/A;  . LOWER EXTREMITY ANGIOGRAPHY N/A 02/20/2020   Procedure: LOWER EXTREMITY ANGIOGRAPHY;  Surgeon: Adrian Prows, MD;  [facility]  INVASIVE CV LAB;  Service: Cardiovascular;  Laterality: N/A;  . LYSIS OF ADHESION N/A 04/23/2015   Procedure: LYSIS OF ADHESION;  Surgeon: Michael Boston, MD;  Location: [facility];  Service: General;  Laterality: N/A;  . MOHS SURGERY  02/2017  . PERIPHERAL VASCULAR INTERVENTION N/A 02/20/2020   Procedure: PERIPHERAL VASCULAR INTERVENTION;  Surgeon: Adrian Prows, MD;  Location: [place] CV LAB;  Service: Cardiovascular;  Laterality: N/A;  Abdominal  . PROSTATE BIOPSY  ~ 2002  . REMOVE SUTURE UNDER ANESTHESIA  2011   stitch abscesses LLQ & midline  . SMALL BOWEL  REPAIR  03/1999   postop abscesses with fistula.  Abscesses drained.  Fistula repaired.  . TESTICLE SURGERY Right 1998  . VENTRAL HERNIA REPAIR N/A 04/23/2015   Procedure: LAPAROSCOPIC VENTRAL WALL HERNIA REPAIR AND LAP LYSIS OF ADHESIONS;  Surgeon: Michael Boston, MD;  Location: [facility];  Service: General;  Laterality: N/A;  With MESH  . VENTRAL HERNIA REPAIR N/A 12/29/2018   Procedure: LAPAROSCOPIC LYSIS OF ADHESIONS X2.5 HOURS, OPEN LYSIS OF ADHESIONS X1 HOUR, laparoscopic REPAIR OF INCARCERATED INCISIONAL HERNIA WITH MESH;  Surgeon: Michael Boston, MD;  Location: [facility];  Service: General;  Laterality: N/A;   Social History   Tobacco Use  . Smoking status: Former Smoker    Packs/day: 2.00    Years: 47.00    Pack years: 94.00    Types: Cigarettes    Quit date: 11/30/2004    Years since quitting: 15.4  . Smokeless tobacco: Never Used  Substance Use Topics  . 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 Negative for chest pain and leg swelling.  Gastrointestinal: Negative for melena.   Objective  Blood pressure 138/61, pulse 62, resp. rate 16, height '5\' 8"'$  (1.727 m), weight 203 lb 9.6 oz (92.4 kg), SpO2 97 %.  Vitals with BMI 05/13/2020 02/23/2020 02/23/2020  Height '5\' 8"'$  - '5\' 8"'$   Weight 203 lbs 10 oz - 200 lbs  BMI 29.52 - 84.13  Systolic 244 010 272  Diastolic 61 64 65  Pulse 62 70 72     Physical Exam Constitutional:      Appearance: He is well-developed.  Cardiovascular:     Rate and Rhythm: Normal rate and regular rhythm.     Pulses: Intact distal pulses.          Carotid pulses are 2+ on the right side and 2+ on the left side with bruit.      Femoral pulses are 2+ on the right side and 2+ on the left side.      Dorsalis pedis pulses are 2+ on the right side and 2+ on the left side.       Posterior tibial pulses are 1+ on the right  side and 1+ on the left side.     Heart sounds: Normal heart sounds. No murmur heard.  No gallop.      Comments: No leg edema, no JVD. Pulmonary:     Effort: Pulmonary effort is normal.     Breath sounds: Normal breath sounds.  Abdominal:     General: Bowel sounds are normal.     Palpations: Abdomen is soft.    Laboratory examination:   Recent Labs    01/23/20 0855 02/20/20 0602  NA 138 139  K 5.3* 4.4  CL 98 106  CO2 23 25  GLUCOSE 100* 104*  BUN 16 18  CREATININE 1.24 1.27*  CALCIUM 9.9 9.0  GFRNONAA 55* 54*  GFRAA 64 >60   CrCl cannot be calculated (Patient's most recent lab result is older than the maximum 21 days allowed.).  CMP Latest Ref Rng & Units 02/20/2020 01/23/2020 12/24/2017  Glucose 70 - 99 mg/dL 346(M) 873(Z) 308(F)  BUN 8 - 23 mg/dL 18 16 -  Creatinine 6.83 - 1.24 mg/dL 8.70(U) 5.82 -  Sodium 135 - 145 mmol/L 139 138 141  Potassium 3.5 - 5.1 mmol/L 4.4 5.3(H) 4.1  Chloride 98 - 111 mmol/L 106 98 -  CO2 22 - 32 mmol/L 25 23 -  Calcium 8.9 - 10.3 mg/dL 9.0 9.9 -  Total Protein 6.0 - 8.5 g/dL - 7.0 -  Total Bilirubin 0.0 - 1.2 mg/dL - 0.4 -  Alkaline Phos 39 - 117 IU/L - 89 -  AST 0 - 40 IU/L - 23 -  ALT 0 - 44 IU/L - 19 -   CBC Latest Ref Rng & Units 02/20/2020 12/28/2018 12/24/2017  WBC 4.0 - 10.5 K/uL 9.9 7.7 -  Hemoglobin 13.0 - 17.0 g/dL 60.8 88.3 58.4  Hematocrit 39 - 52 % 40.7 41.4 40.0  Platelets 150 - 400 K/uL 254 268 -  . Lipid Panel     Component Value Date/Time   CHOL 174 01/23/2020 0854   TRIG 208 (H) 01/23/2020 0854   HDL 34 (L) 01/23/2020 0854   LDLCALC 104 (H) 01/23/2020 0854   HEMOGLOBIN A1C Lab Results  Component Value Date   HGBA1C 5.9 (H) 02/04/2016   MPG 123 02/04/2016   TSH No results for input(s): TSH in the last 8760 hours.  External labs:  Labs 11/10/2018: Serum glucose 93 mg, BUN 18, EGFR >60 mL, CMP normal.  Total cholesterol 156, triglycerides 171, HDL 36, LDL 85.  PSA normal.  Medications and allergies    Allergies  Allergen Reactions  . Losartan Other (See Comments)    Ranl failure  . Latex Rash    Prolong contact  Bandages     Current Outpatient Medications  Medication Instructions  . amLODipine (NORVASC) 5 MG tablet TAKE 1 TABLET(5 MG) BY MOUTH DAILY  . aspirin EC 81 mg, Oral, Daily  . atorvastatin (LIPITOR) 40 mg, Oral, Daily at bedtime  . clopidogrel (PLAVIX) 75 mg, Oral, Daily  . Co Q-10 300 mg, Oral, Daily  . docusate sodium (COLACE) 100 mg, Oral, Daily at bedtime  . ezetimibe (ZETIA) 10 MG tablet TAKE 1 TABLET(10 MG) BY MOUTH DAILY  . Fish Oil 1,200 mg, Oral, Daily  . losartan-hydrochlorothiazide (HYZAAR) 50-12.5 MG tablet 1 tablet, Oral, Daily  . melatonin 5 mg, Oral, Daily at bedtime  . metoprolol succinate (TOPROL-XL) 100 mg, Oral, Daily, Take with or immediately following a meal.  . mirabegron ER (MYRBETRIQ) 25 mg, Oral, Daily  . Multiple Vitamins-Minerals (MEGA MULTIVITAMIN FOR MEN PO) 1 tablet, Oral,  Every morning - 10a  . Vitamin D3 400 Units, Oral, Daily    Radiology:  No results found.  Cardiac Studies:   Echocardiogram 10/15/2016:  Normal LV systolic function, EF 60-65%.  Mild LVH, grade 1 diastolic dysfunction.  Bilateral lower extremity segmental pressures 02/02/2017: 1. Normal bilateral resting ankle-brachial indices, arterial waveforms, pulse volume recordings and segmental pressures. 2. However, correlation with prior CT imaging demonstrates bulky calcified atherosclerotic plaque in the infrarenal abdominal aorta with a coral-reef configuration resulting in significant narrowing of the luminal diameter consistent with inflow disease. This could  explain the patient's clinical symptoms of buttock/hip and upper thigh claudication.  Lexiscan Tetrofosmin stress test 11/29/2019: No previous exam available for comparison. Lexiscan/walking nuclear stress test performed using 1-day protocol. Stress EKG is non-diagnostic, as this is pharmacological stress  test. Stress EKG at 77% MPHR showed sinus tachycardia, RBBB.  SPECT imaging show small sized, mild intensity, predominantly reversible perfusion defect in basal inferior myocardium. Stress LVEF 60%. Low risk study.  Carotid artery duplex 12/06/2019:  Mild heterogeneous plaque noted in the left carotid artery.  Doppler flow velocities in the left internal carotid and common carotid  artery are consistent with stenosis in the range of 1-15% with mild heterogeneous plaque.  Antegrade right vertebral artery flow. Antegrade left vertebral artery flow.  Follow up studies appropriate if clinically indicated.  Echocardiogram 12/06/2019:  Left ventricle cavity is normal in size. Mild concentric hypertrophy of  the left ventricle. Normal LV systolic function with EF 55%. Normal global  wall motion. Doppler evidence of grade I (impaired) diastolic dysfunction,  normal LAP.  Trileaflet aortic valve. Mild to moderate aortic regurgitation.  Moderate (Grade II) mitral regurgitation.  Mild tricuspid regurgitation. Estimated pulmonary artery systolic pressure is 20 mmHg.  Lower Extremity Arterial Duplex 12/06/2019:  No hemodynamically significant stenoses are identified in the bilateral  lower extremity arterial system.   This exam reveals mildly decreased perfusion of the right lower extremity,  noted at the post tibial artery level (ABI 0.94) and mildly decreased  perfusion of the left lower extremity, noted at the post tibial artery level (ABI 0.94).  Consider evaluation for pseudoclaudication.   Peripheral arteriogram 02/20/20  Abdominal aortogram, abdominal angiogram with limited bifemoral arteriogram. Lithotripsy of the abdominal aortic stenosis followed by  PTA and stenting with 10.0 x 39 mm Omnilink Elite balloon expandable stent.  Angiographic data: There was diffuse atherosclerotic changes noted throughout the bilateral femoral arteries by ultrasound guidance.  There was severe  calcification of the abdominal aorta.  There is mild tortuosity.  There was no abdominal aortic aneurysm.  There was a heavily calcific at least a  70% stenosis in the distal abdominal aorta.  The aortoiliac bifurcation was widely patent with mild calcification and mild disease.  Bilateral common femoral arteries were widely patent.  Bilateral renal arteries were widely patent.  Intervention: Successful lithotripsy of the calcific distal abdominal aortic stenosis followed by stenting with balloon expandable 10.0 x 39 mm Omnilink Elite balloon expandable stent which was postdilated with a 14 mm Atlas balloon.  70% stenosis reduced to less than 10%, 40 mmHg pressure gradient reduced to 0 mmHg gradient. A total of 130 mL contrast utilized.  ABI 02/27/2020:  This exam reveals normal perfusion of the right lower extremity (ABI 1.00)  and mildly decreased perfusion of the left lower extremity, noted at the  dorsalis pedis and post tibial artery level (ABI 0.90). No significant  change since 12/06/19.  EKG:  EKG 05/13/2020: Sinus bradycardia at rate of 63 bpm, left atrial abnormality, left axis deviation, left anterior fascicular block.  Incomplete right bundle branch block.  No evidence of ischemia.  No significant change from 11/22/2019.  Assessment     ICD-10-CM   1. Abdominal aortic stenosis CTA abd 02/04/2016  Q25.1 PCV AORTA DUPLEX  2. Claudication of gluteal region ([place])  I73.9 PCV AORTA DUPLEX  3. Primary hypertension  I10 EKG [id number]    Basic metabolic panel  4. Hypercholesteremia  E78.00 Lipid Panel With LDL/HDL Ratio     Recommendations:   No orders  of the defined types were placed in this encounter.   Matthew Hodges  is a 79 y.o. Caucasian male with hypertension, hyperlipidemia, abdominal aortic stenosis with bilateral hip claudication, prior heavy Alcohol use, chronic dyspnea on exertion, underwent successful lithotripsy followed by stenting of distal abdominal aorta on  01/31/2020.  He has noticed improvement in symptoms of claudication and has been walking 2 miles a day.  Obtain abdominal aortic duplex to establish a same baseline as it has been 3 months since enzyme plasty.  He has bounding pulse in his femoral and also popliteal pulse and pedal pulses are felt.  For now continue dual antiplatelet therapy.  With regard to hyperlipidemia, I would increase the dose of Lipitor from 20 mg to 40 mg which he is tolerating.  I'll obtain lipid profile testing.    Since being on present medications with beta-blockers, amlodipine, blood pressure has been well controlled.  Previously he was on losartan HCT 50/12.5 mg daily, but did develop renal insufficiency.  This may have been also related to contrast, as he does not have renal artery stenosis, would like to rechallenge her with 50/12.5 mg of losartan HCT with 1/2 tablet daily in view of PAD and I will obtain BMP in 10 days.    Patient is also enrolled in  Remote Patient Monitoring and Principal Care Management as patient is high risk for hospitalization and complications from underlying medical conditions.  I will see him back in 6 months follow-up.  Adrian Prows, MD, [facility] 05/13/2020, 2:40 PM [facility] [place] Office: [phone]

## 2020-05-29 LAB — LIPID PANEL WITH LDL/HDL RATIO
Cholesterol, Total: 127 mg/dL (ref 100–199)
HDL: 33 mg/dL — ABNORMAL LOW (ref >39)
LDL Chol Calc (NIH): 66 mg/dL (ref 0–99)
LDL/HDL Ratio: 2 ratio (ref 0.0–3.6)
Triglycerides: 164 mg/dL — ABNORMAL HIGH (ref 0–149)
VLDL Cholesterol Cal: 28 mg/dL (ref 5–40)

## 2020-05-29 LAB — BASIC METABOLIC PANEL
BUN/Creatinine Ratio: 16 (ref 10–24)
BUN: 21 mg/dL (ref 8–27)
CO2: 22 mmol/L (ref 20–29)
Calcium: 9.2 mg/dL (ref 8.6–10.2)
Chloride: 103 mmol/L (ref 96–106)
Creatinine, Ser: 1.33 mg/dL — ABNORMAL HIGH (ref 0.76–1.27)
GFR calc Af Amer: 59 mL/min/{1.73_m2} — ABNORMAL LOW (ref >59)
GFR calc non Af Amer: 51 mL/min/{1.73_m2} — ABNORMAL LOW (ref >59)
Glucose: 94 mg/dL (ref 65–99)
Potassium: 4.5 mmol/L (ref 3.5–5.2)
Sodium: 140 mmol/L (ref 134–144)

## 2020-06-29 ENCOUNTER — Other Ambulatory Visit: Payer: Self-pay | Admitting: Cardiology

## 2020-06-29 DIAGNOSIS — E7800 Pure hypercholesterolemia, unspecified: Secondary | ICD-10-CM

## 2020-07-29 ENCOUNTER — Other Ambulatory Visit: Payer: Self-pay | Admitting: Cardiology

## 2020-07-29 DIAGNOSIS — I10 Essential (primary) hypertension: Secondary | ICD-10-CM

## 2020-07-29 DIAGNOSIS — E7800 Pure hypercholesterolemia, unspecified: Secondary | ICD-10-CM

## 2020-08-02 ENCOUNTER — Other Ambulatory Visit: Payer: Self-pay

## 2020-08-02 ENCOUNTER — Ambulatory Visit: Payer: Medicare Other

## 2020-08-09 ENCOUNTER — Ambulatory Visit: Payer: Medicare Other

## 2020-08-09 ENCOUNTER — Other Ambulatory Visit: Payer: Self-pay

## 2020-08-12 ENCOUNTER — Encounter: Payer: Self-pay | Admitting: Cardiology

## 2020-08-12 ENCOUNTER — Ambulatory Visit: Payer: Medicare Other | Admitting: Cardiology

## 2020-08-12 ENCOUNTER — Other Ambulatory Visit: Payer: Self-pay

## 2020-08-12 VITALS — BP 139/76 | HR 74 | Resp 16 | Ht 68.0 in | Wt 204.0 lb

## 2020-08-12 DIAGNOSIS — I10 Essential (primary) hypertension: Secondary | ICD-10-CM

## 2020-08-12 DIAGNOSIS — I714 Abdominal aortic aneurysm, without rupture, unspecified: Secondary | ICD-10-CM

## 2020-08-12 DIAGNOSIS — I739 Peripheral vascular disease, unspecified: Secondary | ICD-10-CM

## 2020-08-12 DIAGNOSIS — Q251 Coarctation of aorta: Secondary | ICD-10-CM

## 2020-08-12 NOTE — Progress Notes (Addendum)
Primary Physician/Referring:  London Pepper, MD  Patient ID: Matthew Hodges, male    DOB: January 04, 1941, 79 y.o.   MRN: [id number]  Chief Complaint  Patient presents with  . PAD  . Hypertension  . Follow-up    65month   HPI:    Matthew Hodges  is a 79 y.o. Matthew Hodges  is a 79 y.o. Caucasian male with hypertension, hyperlipidemia, abdominal aortic stenosis with bilateral hip claudication, prior heavy alcohol use, chronic dyspnea on exertion, underwent successful lithotripsy followed by stenting of distal abdominal aorta on 01/31/2020.  The patient states his claudication symptoms remain improved since his angioplasty in March.  He only experiences claudication symptoms occasionally when he walks uphill. He states he walks 5-6 times per week for 1-2 miles without difficulty. At his last visit he was started on a trial of losartan-HCTZ. He states this made him feel weak and sore so he stopped it after 2-3 weeks. Denies bleeding complications from dual anti-platelet therapy.   Past Medical History:  Diagnosis Date  . Aortic atherosclerosis ([place]) 11/10/2017   noted on CT abd/pelvis  . Aortic stenosis 2012  . Basal cell carcinoma    "sqattered"  . BBB (bundle branch block) 04/17/2015   Right   . Bilateral inguinal hernia 11/10/2017   possible small, noted on CT abd/pelvis  . Bilateral inguinal hernia (BIH) s/p lap repair w mesh 12/24/2017 12/24/2017  . Cerebral artery disease   . Claudication of both lower extremities ([place])   . Diverticular disease   . Frequent urination   . Hearing impaired    bilateral hearing aids  . Hearing loss   . Heart murmur    "after surgeries before"  . 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 Hyperlipidemia   . Hypertension    hx. of . no meds since 2004  . Left femoral hernia s/p lap repair w mesh 12/24/2017 12/24/2017  . Left obturator hernia s/p lap repair w mesh 12/24/2017 12/24/2017  . Prostate cancer [facility])    treated  with radiation  . Recurrent ventral incisional hernia s/p lap re-repair w mesh May 2016 04/23/2015  . Right acoustic neuroma ([place])   . SBO (small bowel obstruction) ([place]) 03/2016   last 04-21-16 admission([facility])"multiple occurrences"  . Squamous carcinoma    "sqattered"Scalp 1 polyethelene skin prostesis left frontal scalp area-removable.  . Stroke ([place]) 1979   no deficits  . Testicular cyst    history of right  . Wears dentures    upper and lower  . Wears glasses   . Wears hearing aid in both ears    Past Surgical History:  Procedure Laterality Date  . COLECTOMY WITH COLOSTOMY CREATION/HARTMANN PROCEDURE  01/1999   Hartmans procedure with colostomy  . COLONOSCOPY    . COLOSTOMY TAKEDOWN  08/1999  . INCISIONAL HERNIA REPAIR  2001   open w mesh  . INGUINAL HERNIA REPAIR N/A 12/24/2017   Procedure: LAPAROSCOPIC  BILATERAL INGUINAL HERNIA REPAIR WITH MESH, LYSIS OF ADHESIONS;  Surgeon: Michael Boston, MD;  Location: [facility];  Service: General;  Laterality: N/A;  . LAPAROSCOPIC INCISIONAL / UMBILICAL / [place]  2008   with mesh.  LLQ old colostomy site  . LAPAROSCOPIC LYSIS OF ADHESIONS N/A 06/25/2016   Procedure: LAPAROSCOPIC LYSIS OF ADHESIONS;  Surgeon: Michael Boston, MD;  Location: [facility];  Service: General;  Laterality: N/A;  . LOWER EXTREMITY ANGIOGRAPHY N/A 02/20/2020   Procedure: LOWER  EXTREMITY ANGIOGRAPHY;  Surgeon: Adrian Prows, MD;  Location: [facility];  Service: Cardiovascular;  Laterality: N/A;  . LYSIS OF ADHESION N/A 04/23/2015   Procedure: LYSIS OF ADHESION;  Surgeon: Michael Boston, MD;  Location: [facility];  Service: General;  Laterality: N/A;  . MOHS SURGERY  02/2017  . PERIPHERAL VASCULAR INTERVENTION N/A 02/20/2020   Procedure: PERIPHERAL VASCULAR INTERVENTION;  Surgeon: Adrian Prows, MD;  Location: [facility];  Service: Cardiovascular;  Laterality: N/A;  Abdominal  . PROSTATE BIOPSY  ~ 2002  . REMOVE SUTURE UNDER ANESTHESIA  2011     stitch abscesses LLQ & midline  . SMALL BOWEL REPAIR  03/1999   postop abscesses with fistula.  Abscesses drained.  Fistula repaired.  . TESTICLE SURGERY Right 1998  . VENTRAL HERNIA REPAIR N/A 04/23/2015   Procedure: LAPAROSCOPIC VENTRAL WALL HERNIA REPAIR AND LAP LYSIS OF ADHESIONS;  Surgeon: Michael Boston, MD;  Location: [facility];  Service: General;  Laterality: N/A;  With MESH  . VENTRAL HERNIA REPAIR N/A 12/29/2018   Procedure: LAPAROSCOPIC LYSIS OF ADHESIONS X2.5 HOURS, OPEN LYSIS OF ADHESIONS X1 HOUR, laparoscopic REPAIR OF INCARCERATED INCISIONAL HERNIA WITH MESH;  Surgeon: Michael Boston, MD;  Location: [facility];  Service: General;  Laterality: N/A;   Social History   Tobacco Use  . Smoking status: Former Smoker    Packs/day: 2.00    Years: 47.00    Pack years: 94.00    Types: Cigarettes    Quit date: 11/30/2004    Years since quitting: 15.7  . Smokeless tobacco: Never Used  Substance Use Topics  . Alcohol use: No    Comment: Quit-recovering alcoholic sober since 6948  Marital Status: Married    ROS  Review of Systems  Cardiovascular: Positive for claudication (minimal hip claudication). Negative for chest pain and leg swelling.  Gastrointestinal: Negative for melena.   Objective  Blood pressure 139/76, pulse 74, resp. rate 16, height $RemoveBe'5\' 8"'kqSzrNonf$  (1.727 m), weight 204 lb (92.5 kg), SpO2 93 %.  Vitals with BMI 08/12/2020 08/12/2020 05/13/2020  Height - $Remove'5\' 8"'mFjBJpb$  $RemoveB'5\' 8"'iSVkJViX$   Weight - 204 lbs 203 lbs 10 oz  BMI - 54.62 70.35  Systolic 009 381 829  Diastolic 76 44 61  Pulse 74 70 62     Physical Exam Constitutional:      Appearance: He is well-developed.  Cardiovascular:     Rate and Rhythm: Normal rate and regular rhythm.     Pulses: Intact distal pulses.          Carotid pulses are 2+ on the right side and 2+ on the left side with bruit.      Radial pulses are 2+ on the right side and 2+ on the left side.       Femoral pulses are 2+ on the right side and 2+ on the left side.       Dorsalis pedis pulses are 2+ on the right side and 2+ on the left side.       Posterior tibial pulses are 2+ on the right side and 2+ on the left side.     Heart sounds: Normal heart sounds. No murmur heard.  No gallop.      Comments: No leg edema, no JVD. Pulmonary:     Effort: Pulmonary effort is normal.     Breath sounds: Normal breath sounds.  Abdominal:     General: Bowel sounds are normal.     Palpations: Abdomen is soft.  Comments: Obese    Laboratory examination:   Recent Labs    01/23/20 0855 02/20/20 0602 05/28/20 0845  NA 138 139 140  K 5.3* 4.4 4.5  CL 98 106 103  CO2 $Re'23 25 22  'HCs$ GLUCOSE 100* 104* 94  BUN $Re'16 18 21  'JNE$ CREATININE 1.24 1.27* 1.33*  CALCIUM 9.9 9.0 9.2  GFRNONAA 55* 54* 51*  GFRAA 64 >60 59*   CrCl cannot be calculated (Patient's most recent lab result is older than the maximum 21 days allowed.).  CMP Latest Ref Rng & Units 05/28/2020 02/20/2020 01/23/2020  Glucose 65 - 99 mg/dL 94 104(H) 100(H)  BUN 8 - 27 mg/dL $Remove'21 18 16  'bokYHxW$ Creatinine 0.76 - 1.27 mg/dL 1.33(H) 1.27(H) 1.24  Sodium 134 - 144 mmol/L 140 139 138  Potassium 3.5 - 5.2 mmol/L 4.5 4.4 5.3(H)  Chloride 96 - 106 mmol/L 103 106 98  CO2 20 - 29 mmol/L $RemoveB'22 25 23  'OKOSDKnE$ Calcium 8.6 - 10.2 mg/dL 9.2 9.0 9.9  Total Protein 6.0 - 8.5 g/dL - - 7.0  Total Bilirubin 0.0 - 1.2 mg/dL - - 0.4  Alkaline Phos 39 - 117 IU/L - - 89  AST 0 - 40 IU/L - - 23  ALT 0 - 44 IU/L - - 19   CBC Latest Ref Rng & Units 02/20/2020 12/28/2018 12/24/2017  WBC 4.0 - 10.5 K/uL 9.9 7.7 -  Hemoglobin 13.0 - 17.0 g/dL 13.4 13.5 13.6  Hematocrit 39 - 52 % 40.7 41.4 40.0  Platelets 150 - 400 K/uL 254 268 -  . Lipid Panel  Last lipids Lab Results  Component Value Date   CHOL 127 05/28/2020   HDL 33 (L) 05/28/2020   LDLCALC 66 05/28/2020   TRIG 164 (H) 05/28/2020    HEMOGLOBIN A1C Lab Results  Component Value Date   HGBA1C 5.9 (H) 02/04/2016   MPG 123 02/04/2016   TSH No results for input(s): TSH in the last 8760  hours.  External labs:  Labs 11/10/2018: Serum glucose 93 mg, BUN 18, EGFR >60 mL, CMP normal.  Total cholesterol 156, triglycerides 171, HDL 36, LDL 85.  PSA normal.  Medications and allergies   Allergies  Allergen Reactions  . Losartan Other (See Comments)    Ranl failure  . Latex Rash    Prolong contact  Bandages     Current Outpatient Medications  Medication Instructions  . amLODipine (NORVASC) 5 MG tablet TAKE 1 TABLET(5 MG) BY MOUTH DAILY  . aspirin EC 81 mg, Oral, Daily  . atorvastatin (LIPITOR) 40 MG tablet TAKE 1 TABLET(40 MG) BY MOUTH AT BEDTIME  . clopidogrel (PLAVIX) 75 mg, Oral, Daily  . Co Q-10 300 mg, Oral, Daily  . docusate sodium (COLACE) 100 mg, Oral, Daily at bedtime  . ezetimibe (ZETIA) 10 MG tablet TAKE 1 TABLET(10 MG) BY MOUTH DAILY  . Fish Oil 1,200 mg, Oral, Daily  . melatonin 5 mg, Oral, Daily at bedtime  . metoprolol succinate (TOPROL-XL) 100 mg, Oral, Daily, Take with or immediately following a meal.  . mirabegron ER (MYRBETRIQ) 25 mg, Oral, Daily  . Multiple Vitamins-Minerals (MEGA MULTIVITAMIN FOR MEN PO) 1 tablet, Oral,  Every morning - 10a  . Vitamin D3 400 Units, Oral, Daily    Radiology:  No results found.  Cardiac Studies:   Echocardiogram 10/15/2016:  Normal LV systolic function, EF 78-29%.  Mild LVH, grade 1 diastolic dysfunction.  Bilateral lower extremity segmental pressures 02/02/2017: 1. Normal bilateral resting ankle-brachial indices, arterial waveforms, pulse volume  recordings and segmental pressures. 2. However, correlation with prior CT imaging demonstrates bulky calcified atherosclerotic plaque in the infrarenal abdominal aorta with a coral-reef configuration resulting in significant narrowing of the luminal diameter consistent with inflow disease. This could explain the patient's clinical symptoms of buttock/hip and upper thigh claudication.  Lexiscan Tetrofosmin stress test 11/29/2019: No previous exam available for  comparison. Lexiscan/walking nuclear stress test performed using 1-day protocol. Stress EKG is non-diagnostic, as this is pharmacological stress test. Stress EKG at 77% MPHR showed sinus tachycardia, RBBB.  SPECT imaging show small sized, mild intensity, predominantly reversible perfusion defect in basal inferior myocardium. Stress LVEF 60%. Low risk study.  Carotid artery duplex 12/06/2019:  Mild heterogeneous plaque noted in the left carotid artery.  Doppler flow velocities in the left internal carotid and common carotid  artery are consistent with stenosis in the range of 1-15% with mild heterogeneous plaque.  Antegrade right vertebral artery flow. Antegrade left vertebral artery flow.  Follow up studies appropriate if clinically indicated.  Echocardiogram 12/06/2019:  Left ventricle cavity is normal in size. Mild concentric hypertrophy of  the left ventricle. Normal LV systolic function with EF 55%. Normal global  wall motion. Doppler evidence of grade I (impaired) diastolic dysfunction,  normal LAP.  Trileaflet aortic valve. Mild to moderate aortic regurgitation.  Moderate (Grade II) mitral regurgitation.  Mild tricuspid regurgitation. Estimated pulmonary artery systolic pressure is 20 mmHg.  Lower Extremity Arterial Duplex 12/06/2019:  No hemodynamically significant stenoses are identified in the bilateral  lower extremity arterial system.   This exam reveals mildly decreased perfusion of the right lower extremity,  noted at the post tibial artery level (ABI 0.94) and mildly decreased  perfusion of the left lower extremity, noted at the post tibial artery level (ABI 0.94).  Consider evaluation for pseudoclaudication.   Peripheral arteriogram 02/20/20  Abdominal aortogram, abdominal angiogram with limited bifemoral arteriogram. Lithotripsy of the abdominal aortic stenosis followed by  PTA and stenting with 10.0 x 39 mm Omnilink Elite balloon expandable stent.  Angiographic  data: There was diffuse atherosclerotic changes noted throughout the bilateral femoral arteries by ultrasound guidance.  There was severe calcification of the abdominal aorta.  There is mild tortuosity.  There was no abdominal aortic aneurysm.  There was a heavily calcific at least a  70% stenosis in the distal abdominal aorta.  The aortoiliac bifurcation was widely patent with mild calcification and mild disease.  Bilateral common femoral arteries were widely patent.  Bilateral renal arteries were widely patent.  Intervention: Successful lithotripsy of the calcific distal abdominal aortic stenosis followed by stenting with balloon expandable 10.0 x 39 mm Omnilink Elite balloon expandable stent which was postdilated with a 14 mm Atlas balloon.  70% stenosis reduced to less than 10%, 40 mmHg pressure gradient reduced to 0 mmHg gradient. A total of 130 mL contrast utilized.  ABI 02/27/2020:  This exam reveals normal perfusion of the right lower extremity (ABI 1.00)  and mildly decreased perfusion of the left lower extremity, noted at the  dorsalis pedis and post tibial artery level (ABI 0.90). No significant  change since 12/06/19.  Abdominal Aortic Duplex 08/09/2020:  The maximum aorta (sac) diameter is 4.68 cm (prox). Moderate sized abdominal aortic aneurysm is noted in the proximal and mid aorta. An  abdominal aortic aneurysm measuring 4.21 x 4.29 x 4.68 cm is seen. Mid aorta appears near occlusive with fibrotic tissue in the lumen, cannot exclude thrombus. Iliac arteries could not be well visualized and flow could  not be established. Consider further evaluation of abdominal aortic stenosis. Recheck duplex for stability of the AAA in 1 year. (see enclosed images).  EKG: EKG 05/13/2020: Sinus bradycardia at rate of 63 bpm, left atrial abnormality, left axis deviation, left anterior fascicular block.  Incomplete right bundle branch block.  No evidence of ischemia.  No significant change from  11/22/2019.  Assessment     ICD-10-CM   1. Abdominal aortic stenosis  Q25.1 PCV AORTA DUPLEX  2. AAA (abdominal aortic aneurysm) without rupture (HCC)  I71.4 PCV AORTA DUPLEX  3. Essential hypertension  I10   4. Peripheral artery disease (HCC)  I73.9 PCV ANKLE BRACHIAL INDEX (ABI)     No orders of the defined types were placed in this encounter.   Recommendations:    Matthew Hodges  is a 79 y.o. Caucasian male with hypertension, hyperlipidemia, abdominal aortic stenosis with bilateral hip claudication, prior heavy Alcohol use, chronic dyspnea on exertion, underwent successful lithotripsy followed by stenting of distal abdominal aorta on 01/31/2020.  His claudication symptoms are improved since angioplasty and he is walking about 2 miles per day.  Aortic duplex from 08/09/2020 was reviewed and the results were discussed with the patient and his wife. Although thrombus could not be excluded on ultrasound this is probably due to significant artifact from calcium deposits at the site of the stent. On examination his pulses are bounding. He is essentially asymptomatic and walking 2 miles almost daily. With this in mind, we will continue with observation and obtain a repeat aortic duplex as well as an ABI in one year prior to his follow up appointment. Continue dual antiplatelet therapy.  With regard to hypertension, he was started on losartan HCTZ at his last visit but did not tolerate the medication and discontinued it after 2-3 weeks. His blood pressure is controlled in clinic today with beta blocker and amlodipine therapy. He can continue his current regimen at this time.   I reviewed his labs from his last visit. LDL is well controlled, triglycerides are mildly elevated. He is presently on Lipitor 40 mg and can continue this.   We will see him back in one year after aortic duplex and ABI.  Blair Heys, PA Student 08/12/20 4:42 PM  Patient seen and examined in conjunction with Blair Heys,  PA second year student at [employer].  Time spent is in direct patient face to face encounter not including the teaching and training involved.    Adrian Prows, MD, [facility] 08/12/2020, 4:42 PM Office: [phone]

## 2020-08-12 NOTE — Patient Instructions (Signed)
1 day before aortic duplex, I would recommend that you go on clear liquid diet and avoid vegetables and also rice, pasta.  Also take Gas-X pills 2 tablets 3 times a day 1 day prior and the day of.  On the day of the test do not eat or drink anything. Chicken, eggs are allowed but small portion.

## 2020-11-04 ENCOUNTER — Other Ambulatory Visit: Payer: Self-pay | Admitting: Cardiology

## 2020-11-04 DIAGNOSIS — I10 Essential (primary) hypertension: Secondary | ICD-10-CM

## 2020-11-14 ENCOUNTER — Ambulatory Visit: Payer: Medicare Other | Admitting: Cardiology

## 2020-12-26 ENCOUNTER — Other Ambulatory Visit: Payer: Self-pay | Admitting: Cardiology

## 2020-12-26 DIAGNOSIS — E7800 Pure hypercholesterolemia, unspecified: Secondary | ICD-10-CM

## 2021-02-04 ENCOUNTER — Other Ambulatory Visit: Payer: Self-pay | Admitting: Cardiology

## 2021-02-04 DIAGNOSIS — I10 Essential (primary) hypertension: Secondary | ICD-10-CM

## 2021-02-04 DIAGNOSIS — E7800 Pure hypercholesterolemia, unspecified: Secondary | ICD-10-CM

## 2021-02-06 ENCOUNTER — Other Ambulatory Visit: Payer: Self-pay | Admitting: Cardiology

## 2021-02-06 DIAGNOSIS — I10 Essential (primary) hypertension: Secondary | ICD-10-CM

## 2021-06-20 ENCOUNTER — Other Ambulatory Visit: Payer: Self-pay | Admitting: Cardiology

## 2021-06-20 DIAGNOSIS — E7800 Pure hypercholesterolemia, unspecified: Secondary | ICD-10-CM

## 2021-06-26 DIAGNOSIS — I10 Essential (primary) hypertension: Secondary | ICD-10-CM | POA: Diagnosis not present

## 2021-06-26 DIAGNOSIS — R413 Other amnesia: Secondary | ICD-10-CM | POA: Diagnosis not present

## 2021-06-26 DIAGNOSIS — E7800 Pure hypercholesterolemia, unspecified: Secondary | ICD-10-CM | POA: Diagnosis not present

## 2021-07-31 ENCOUNTER — Other Ambulatory Visit: Payer: Self-pay | Admitting: Cardiology

## 2021-07-31 DIAGNOSIS — I10 Essential (primary) hypertension: Secondary | ICD-10-CM

## 2021-07-31 DIAGNOSIS — E7800 Pure hypercholesterolemia, unspecified: Secondary | ICD-10-CM

## 2021-08-01 DIAGNOSIS — Z86007 Personal history of in-situ neoplasm of skin: Secondary | ICD-10-CM | POA: Diagnosis not present

## 2021-08-01 DIAGNOSIS — D225 Melanocytic nevi of trunk: Secondary | ICD-10-CM | POA: Diagnosis not present

## 2021-08-01 DIAGNOSIS — L570 Actinic keratosis: Secondary | ICD-10-CM | POA: Diagnosis not present

## 2021-08-01 DIAGNOSIS — Z08 Encounter for follow-up examination after completed treatment for malignant neoplasm: Secondary | ICD-10-CM | POA: Diagnosis not present

## 2021-08-01 DIAGNOSIS — L814 Other melanin hyperpigmentation: Secondary | ICD-10-CM | POA: Diagnosis not present

## 2021-08-01 DIAGNOSIS — C4442 Squamous cell carcinoma of skin of scalp and neck: Secondary | ICD-10-CM | POA: Diagnosis not present

## 2021-08-01 DIAGNOSIS — L853 Xerosis cutis: Secondary | ICD-10-CM | POA: Diagnosis not present

## 2021-08-01 DIAGNOSIS — C44321 Squamous cell carcinoma of skin of nose: Secondary | ICD-10-CM | POA: Diagnosis not present

## 2021-08-01 DIAGNOSIS — Z85828 Personal history of other malignant neoplasm of skin: Secondary | ICD-10-CM | POA: Diagnosis not present

## 2021-08-01 DIAGNOSIS — L821 Other seborrheic keratosis: Secondary | ICD-10-CM | POA: Diagnosis not present

## 2021-08-12 ENCOUNTER — Ambulatory Visit: Payer: Medicare Other

## 2021-08-12 ENCOUNTER — Other Ambulatory Visit: Payer: Self-pay

## 2021-08-12 DIAGNOSIS — I739 Peripheral vascular disease, unspecified: Secondary | ICD-10-CM | POA: Diagnosis not present

## 2021-08-12 DIAGNOSIS — I714 Abdominal aortic aneurysm, without rupture: Secondary | ICD-10-CM | POA: Diagnosis not present

## 2021-08-21 DIAGNOSIS — M5430 Sciatica, unspecified side: Secondary | ICD-10-CM | POA: Diagnosis not present

## 2021-08-21 DIAGNOSIS — Z23 Encounter for immunization: Secondary | ICD-10-CM | POA: Diagnosis not present

## 2021-08-22 ENCOUNTER — Encounter: Payer: Self-pay | Admitting: Cardiology

## 2021-08-22 ENCOUNTER — Other Ambulatory Visit: Payer: Self-pay

## 2021-08-22 ENCOUNTER — Ambulatory Visit: Payer: Medicare Other | Admitting: Cardiology

## 2021-08-22 VITALS — BP 127/76 | HR 65 | Temp 98.2°F | Resp 17 | Ht 68.0 in | Wt 207.8 lb

## 2021-08-22 DIAGNOSIS — I10 Essential (primary) hypertension: Secondary | ICD-10-CM | POA: Diagnosis not present

## 2021-08-22 DIAGNOSIS — I739 Peripheral vascular disease, unspecified: Secondary | ICD-10-CM | POA: Diagnosis not present

## 2021-08-22 DIAGNOSIS — E7800 Pure hypercholesterolemia, unspecified: Secondary | ICD-10-CM

## 2021-08-22 NOTE — Progress Notes (Signed)
Primary Physician/Referring:  London Pepper, MD  Patient ID: Matthew Hodges, male    DOB: 02/20/1941, 80 y.o.   MRN: [id number]  Chief Complaint  Patient presents with   Follow-up    1 year   PAD   Hypertension   HPI:    NETTIE CROMWELL  is a 80 y.o. DILLIN LOFGREN  is a 80 y.o. Caucasian male with hypertension, hyperlipidemia, abdominal aortic stenosis with bilateral hip claudication, prior heavy alcohol use, chronic dyspnea on exertion, underwent successful lithotripsy followed by stenting of distal abdominal aorta on 01/31/2020.  The patient states his claudication symptoms remain improved since his angioplasty in March.  He only experiences claudication symptoms occasionally when he walks uphill. He states he walks 5-6 times per week for 1-2 miles without difficulty.  He denies any chest pain, dyspnea is remained stable, no leg edema, no PND or orthopnea.  Past Medical History:  Diagnosis Date   Aortic atherosclerosis ([place]) 11/10/2017   noted on CT abd/pelvis   Aortic stenosis 2012   Basal cell carcinoma    "sqattered"   BBB (bundle branch block) 04/17/2015   Right    Bilateral inguinal hernia 11/10/2017   possible small, noted on CT abd/pelvis   Bilateral inguinal hernia (BIH) s/p lap repair w mesh 12/24/2017 12/24/2017   Cerebral artery disease    Claudication of both lower extremities ([place])    Diverticular disease    Frequent urination    Hearing impaired    bilateral hearing aids   Hearing loss    Heart murmur    "after surgeries before"   Horseshoe kidney 11/10/2017   Congenital noted on CT abd/pelvis   Hyperlipidemia    Hypertension    hx. of . no meds since 2004   Left femoral hernia s/p lap repair w mesh 12/24/2017 12/24/2017   Left obturator hernia s/p lap repair w mesh 12/24/2017 12/24/2017   Prostate cancer ([place])    treated with radiation   Recurrent ventral incisional hernia s/p lap re-repair w mesh May 2016 04/23/2015   Right acoustic neuroma ([place])    SBO  (small bowel obstruction) ([place]) 03/2016   last 04-21-16 admission([facility])"multiple occurrences"   Squamous carcinoma    "sqattered"Scalp 1 polyethelene skin prostesis left frontal scalp area-removable.   Stroke [facility]) 1979   no deficits   Testicular cyst    history of right   Wears dentures    upper and lower   Wears glasses    Wears hearing aid in both ears    Past Surgical History:  Procedure Laterality Date   COLECTOMY WITH COLOSTOMY CREATION/HARTMANN PROCEDURE  01/1999   [place] procedure with colostomy   COLONOSCOPY     COLOSTOMY TAKEDOWN  08/1999   INCISIONAL HERNIA REPAIR  2001   open w mesh   INGUINAL HERNIA REPAIR N/A 12/24/2017   Procedure: LAPAROSCOPIC  BILATERAL INGUINAL HERNIA REPAIR WITH MESH, LYSIS OF ADHESIONS;  Surgeon: Michael Boston, MD;  Location: [place];  Service: General;  Laterality: N/A;   LAPAROSCOPIC INCISIONAL / UMBILICAL / [place]  2008   with mesh.  LLQ old colostomy site   LAPAROSCOPIC LYSIS OF ADHESIONS N/A 06/25/2016   Procedure: LAPAROSCOPIC LYSIS OF ADHESIONS;  Surgeon: Michael Boston, MD;  Location: [facility];  Service: General;  Laterality: N/A;   LOWER EXTREMITY ANGIOGRAPHY N/A 02/20/2020   Procedure: LOWER EXTREMITY ANGIOGRAPHY;  Surgeon: Adrian Prows, MD;  Location: [facility];  Service: Cardiovascular;  Laterality:  N/A;   LYSIS OF ADHESION N/A 04/23/2015   Procedure: LYSIS OF ADHESION;  Surgeon: Michael Boston, MD;  Location: [facility];  Service: General;  Laterality: N/A;   MOHS SURGERY  02/2017   PERIPHERAL VASCULAR INTERVENTION N/A 02/20/2020   Procedure: PERIPHERAL VASCULAR INTERVENTION;  Surgeon: Adrian Prows, MD;  Location: [place];  Service: Cardiovascular;  Laterality: N/A;  Abdominal   PROSTATE BIOPSY  ~ 2002   REMOVE SUTURE UNDER ANESTHESIA  2011   stitch abscesses LLQ & midline   SMALL BOWEL REPAIR  03/1999   postop abscesses with fistula.  Abscesses drained.  Fistula repaired.   TESTICLE SURGERY  Right 1998   VENTRAL HERNIA REPAIR N/A 04/23/2015   Procedure: LAPAROSCOPIC VENTRAL WALL HERNIA REPAIR AND LAP LYSIS OF ADHESIONS;  Surgeon: Michael Boston, MD;  Location: [facility];  Service: General;  Laterality: N/A;  With MESH   VENTRAL HERNIA REPAIR N/A 12/29/2018   Procedure: LAPAROSCOPIC LYSIS OF ADHESIONS X2.5 HOURS, OPEN LYSIS OF ADHESIONS X1 HOUR, laparoscopic REPAIR OF INCARCERATED INCISIONAL HERNIA WITH MESH;  Surgeon: Michael Boston, MD;  Location: [facility];  Service: General;  Laterality: N/A;   Social History   Tobacco Use   Smoking status: Former    Packs/day: 2.00    Years: 47.00    Pack years: 94.00    Types: Cigarettes    Quit date: 11/30/2004    Years since quitting: 16.7   Smokeless tobacco: Never  Substance Use Topics   Alcohol use: No    Comment: Quit-recovering alcoholic sober since 3300  Marital Status: Married    ROS  Review of Systems  Cardiovascular:  Positive for claudication (minimal hip claudication). Negative for chest pain and leg swelling.  Gastrointestinal:  Negative for melena.  Objective  Blood pressure 127/76, pulse 65, temperature 98.2 F (36.8 C), temperature source Temporal, resp. rate 17, height 5' 8" (1.727 m), weight 207 lb 12.8 oz (94.3 kg), SpO2 97 %.  Vitals with BMI 08/22/2021 08/12/2020 08/12/2020  Height 5' 8" - 5' 8"  Weight 207 lbs 13 oz - 204 lbs  BMI 76.2 - 26.33  Systolic 354 562 563  Diastolic 76 76 44  Pulse 65 74 70     Physical Exam Constitutional:      Appearance: He is well-developed.  Neck:     Vascular: No carotid bruit or JVD.  Cardiovascular:     Rate and Rhythm: Normal rate and regular rhythm.     Pulses: Intact distal pulses.          Radial pulses are 2+ on the right side and 2+ on the left side.       Femoral pulses are 2+ on the right side and 2+ on the left side.      Dorsalis pedis pulses are 2+ on the right side and 2+ on the left side.       Posterior tibial pulses are 1+ on the right side and 2+ on the left  side.     Heart sounds: Normal heart sounds. No murmur heard.   No gallop.  Pulmonary:     Effort: Pulmonary effort is normal.     Breath sounds: Normal breath sounds.  Abdominal:     General: Bowel sounds are normal.     Palpations: Abdomen is soft.     Comments: Obese  Musculoskeletal:     Right lower leg: No edema.     Left lower leg: No edema.   Laboratory examination:  No results for input(s): NA, K, CL, CO2, GLUCOSE, BUN, CREATININE, CALCIUM, GFRNONAA, GFRAA in the last 8760 hours.  CrCl cannot be calculated (Patient's most recent lab result is older than the maximum 21 days allowed.).  CMP Latest Ref Rng & Units 05/28/2020 02/20/2020 01/23/2020  Glucose 65 - 99 mg/dL 94 104(H) 100(H)  BUN 8 - 27 mg/dL _0 Creatinine 0.76 - 1.27 mg/dL 1.33(H) 1.27(H) 1.24  Sodium 134 - 144 mmol/L 140 139 138  Potassium 3.5 - 5.2 mmol/L 4.5 4.4 5.3(H)  Chloride 96 - 106 mmol/L 103 106 98  CO2 20 - 29 mmol/L _1 Calcium 8.6 - 10.2 mg/dL 9.2 9.0 9.9  Total Protein 6.0 - 8.5 g/dL - - 7.0  Total Bilirubin 0.0 - 1.2 mg/dL - - 0.4  Alkaline Phos 39 - 117 IU/L - - 89  AST 0 - 40 IU/L - - 23  ALT 0 - 44 IU/L - - 19   CBC Latest Ref Rng & Units 02/20/2020 12/28/2018 12/24/2017  WBC 4.0 - 10.5 K/uL 9.9 7.7 -  Hemoglobin 13.0 - 17.0 g/dL 13.4 13.5 13.6  Hematocrit 39.0 - 52.0 % 40.7 41.4 40.0  Platelets 150 - 400 K/uL 254 268 -  . Lipid Panel  Last lipids Lab Results  Component Value Date   CHOL 127 05/28/2020   HDL 33 (L) 05/28/2020   LDLCALC 66 05/28/2020   TRIG 164 (H) 05/28/2020    HEMOGLOBIN A1C Lab Results  Component Value Date   HGBA1C 5.9 (H) 02/04/2016   MPG 123 02/04/2016   TSH No results for input(s): TSH in the last 8760 hours.  External labs:  Cholesterol, total 118.000 m 01/02/2021 HDL 31.000 mg 01/02/2021 LDL 63.000 mg 01/02/2021 Triglycerides 138.000 m 01/02/2021  Creatinine, Serum 1.190 mg/ 01/02/2021 Potassium 4.700 mm 01/02/2021 Magnesium N/D ALT (SGPT)  18.000 U/L 01/02/2021  TSH 1.020 06/26/2021  Labs 11/10/2018: Serum glucose 93 mg, BUN 18, EGFR >60 mL, CMP normal.  Total cholesterol 156, triglycerides 171, HDL 36, LDL 85.  PSA normal.  Medications and allergies   Allergies  Allergen Reactions   Losartan Other (See Comments)    Ranl failure   Latex Rash    Prolong contact  Bandages     Current Outpatient Medications  Medication Instructions   amLODipine (NORVASC) 5 MG tablet TAKE 1 TABLET(5 MG) BY MOUTH DAILY   aspirin EC 81 mg, Oral, Daily   atorvastatin (LIPITOR) 40 MG tablet TAKE 1 TABLET(40 MG) BY MOUTH AT BEDTIME   Co Q-10 300 mg, Oral, Daily   docusate sodium (COLACE) 100 mg, Oral, Daily at bedtime   ezetimibe (ZETIA) 10 MG tablet TAKE 1 TABLET(10 MG) BY MOUTH DAILY   Fish Oil 1,200 mg, Oral, Daily   melatonin 5 mg, Oral, Daily at bedtime   metoprolol succinate (TOPROL-XL) 100 MG 24 hr tablet TAKE 1 TABLET(100 MG) BY MOUTH DAILY WITH OR IMMEDIATELY FOLLOWING A MEAL   mirabegron ER (MYRBETRIQ) 25 mg, Oral, Daily   Multiple Vitamins-Minerals (MEGA MULTIVITAMIN FOR MEN PO) 1 tablet, Oral, Every morning   Vitamin D3 400 Units, Oral, Daily    Radiology:   CTA Abdomen 11/05/2018: 1. Small right paraumbilical hernia containing a loop of nondilated small bowel. This loop is not fully opacified, but oral contrast is made its way pass this loop into the colon. This hernia has increased in size compared to the prior exam. There is also a small midline ventral hernia containing a margin of  small bowel and some adipose tissue. 2. Distal esophageal wall thickening, possible esophagitis. 3. Other imaging findings of potential clinical significance: Aortic Atherosclerosis (ICD10-I70.0) and Emphysema (ICD10-J43.9). Stable 5 mm lingular nodule. Coronary atherosclerosis. Horseshoe kidney with hypodense lesions which are likely cysts but technically too small to characterize. Descending colon diverticulosis. Lumbar spondylosis and  degenerative disc disease with lower lumbar impingement.  Cardiac Studies:   Echocardiogram 10/15/2016:  Normal LV systolic function, EF 66-44%.  Mild LVH, grade 1 diastolic dysfunction.  Bilateral lower extremity segmental pressures 02/02/2017: 1. Normal bilateral resting ankle-brachial indices, arterial waveforms, pulse volume recordings and segmental pressures. 2. However, correlation with prior CT imaging demonstrates bulky calcified atherosclerotic plaque in the infrarenal abdominal aorta with a coral-reef configuration resulting in significant narrowing of the luminal diameter consistent with inflow disease. This could explain the patient's clinical symptoms of buttock/hip and upper thigh claudication.  Lexiscan Tetrofosmin stress test 11/29/2019: No previous exam available for comparison. Lexiscan/walking nuclear stress test performed using 1-day protocol. Stress EKG is non-diagnostic, as this is pharmacological stress test. Stress EKG at 77% MPHR showed sinus tachycardia, RBBB.  SPECT imaging show small sized, mild intensity, predominantly reversible perfusion defect in basal inferior myocardium. Stress LVEF 60%. Low risk study.  Carotid artery duplex  12/06/2019:  Mild heterogeneous plaque noted in the left carotid artery.  Doppler flow velocities in the left internal carotid and common carotid  artery are consistent with stenosis in the range of 1-15% with mild heterogeneous plaque.  Antegrade right vertebral artery flow. Antegrade left vertebral artery flow.  Follow up studies appropriate if clinically indicated.  Echocardiogram 12/06/2019:  Left ventricle cavity is normal in size. Mild concentric hypertrophy of  the left ventricle. Normal LV systolic function with EF 55%. Normal global  wall motion. Doppler evidence of grade I (impaired) diastolic dysfunction,  normal LAP.  Trileaflet aortic valve.  Mild to moderate aortic regurgitation.  Moderate (Grade II) mitral  regurgitation.  Mild tricuspid regurgitation. Estimated pulmonary artery systolic pressure is 20 mmHg.  Lower Extremity Arterial Duplex 12/06/2019:  No hemodynamically significant stenoses are identified in the bilateral  lower extremity arterial system.    This exam reveals mildly decreased perfusion of the right lower extremity,  noted at the post tibial artery level (ABI 0.94) and mildly decreased  perfusion of the left lower extremity, noted at the post tibial artery level (ABI 0.94).  Consider evaluation for pseudoclaudication.   Peripheral arteriogram 02/20/20  Abdominal aortogram, abdominal angiogram with limited bifemoral arteriogram. Lithotripsy of the abdominal aortic stenosis followed by  PTA and stenting with 10.0 x 39 mm Omnilink Elite balloon expandable stent.   Angiographic data: There was diffuse atherosclerotic changes noted throughout the bilateral femoral arteries by ultrasound guidance.  There was severe calcification of the abdominal aorta.  There is mild tortuosity.  There was no abdominal aortic aneurysm.  There was a heavily calcific at least a  70% stenosis in the distal abdominal aorta.   The aortoiliac bifurcation was widely patent with mild calcification and mild disease.  Bilateral common femoral arteries were widely patent.   Bilateral renal arteries were widely patent.   Intervention: Successful lithotripsy of the calcific distal abdominal aortic stenosis followed by stenting with balloon expandable 10.0 x 39 mm Omnilink Elite balloon expandable stent which was postdilated with a 14 mm Atlas balloon.  70% stenosis reduced to less than 10%, 40 mmHg pressure gradient reduced to 0 mmHg gradient. A total of 130 mL contrast utilized.  Abdominal Aortic Duplex  08/12/2021: Patient has had multiple abdominal surgeries and states he is "absolutely full of mesh repairs". Patient images are extremely difficult. I do not feel confident with respect to any anatomical  identifications in his abdominal region. Compared to 08/09/2020, 4.2 x 4.2 x 4.48 cm proximal and mid abdominal attic aneurysm could not be confirmed.  Distal abdominal aorta appears to be atretic with minimal flow.  Bilateral iliac artery could not be visualized.  Consider different modality imaging.  ABI 08/12/2021: This exam reveals mildly decreased perfusion of the right lower extremity, noted at the dorsalis pedis and post tibial artery level (ABI 0.93) and mildly decreased perfusion of the left lower extremity, noted at the post tibial artery level (ABI 0.96).  Study done on 12/21/2019, no significant change in ABI.   EKG: EKG 08/22/2021: Normal sinus rhythm at rate of 66 bpm, left atrial enlargement, left axis deviation, left anterior fascicular block.  Incomplete right bundle branch block.  No evidence of ischemia.  No significant change from 05/13/2020.  Assessment     ICD-10-CM   1. Essential hypertension  I10 EKG 12-Lead       No orders of the defined types were placed in this encounter.   Recommendations:    JAYSHAWN COLSTON  is a 80 y.o. Caucasian male with hypertension, hyperlipidemia, abdominal aortic stenosis with bilateral hip claudication, prior heavy Alcohol use, chronic dyspnea on exertion, underwent successful lithotripsy followed by stenting of distal abdominal aorta on 01/31/2020.  His claudication symptoms are improved since angioplasty and he is walking about 2 miles per day.  Previously reported abdominal [place] is false, he has also had abdominal CT in the past that did not reveal any abdominal attic aneurysm.  The abdominal aortic angioplasty site appears to be patent, patient has excellent pulses in the lower extremity, he has not had any significant claudication.  Blood pressure is also well controlled, his lipids are also under excellent control.  I discussed with him regarding weight loss.  Otherwise he is presently doing well, renal function has remained  stable, I will see him back in a year or sooner if problems.    Adrian Prows, MD, [facility] 08/22/2021, 10:15 AM Office: [phone]

## 2021-09-15 ENCOUNTER — Other Ambulatory Visit: Payer: Self-pay | Admitting: Cardiology

## 2021-09-15 DIAGNOSIS — E7800 Pure hypercholesterolemia, unspecified: Secondary | ICD-10-CM

## 2021-09-18 DIAGNOSIS — M5416 Radiculopathy, lumbar region: Secondary | ICD-10-CM | POA: Diagnosis not present

## 2021-10-31 ENCOUNTER — Other Ambulatory Visit: Payer: Self-pay | Admitting: Cardiology

## 2021-10-31 DIAGNOSIS — D485 Neoplasm of uncertain behavior of skin: Secondary | ICD-10-CM | POA: Diagnosis not present

## 2021-10-31 DIAGNOSIS — L570 Actinic keratosis: Secondary | ICD-10-CM | POA: Diagnosis not present

## 2021-10-31 DIAGNOSIS — L821 Other seborrheic keratosis: Secondary | ICD-10-CM | POA: Diagnosis not present

## 2021-10-31 DIAGNOSIS — I10 Essential (primary) hypertension: Secondary | ICD-10-CM

## 2021-12-13 ENCOUNTER — Other Ambulatory Visit: Payer: Self-pay | Admitting: Cardiology

## 2021-12-13 DIAGNOSIS — E7800 Pure hypercholesterolemia, unspecified: Secondary | ICD-10-CM

## 2022-01-15 DIAGNOSIS — Z0000 Encounter for general adult medical examination without abnormal findings: Secondary | ICD-10-CM | POA: Diagnosis not present

## 2022-01-15 DIAGNOSIS — E7800 Pure hypercholesterolemia, unspecified: Secondary | ICD-10-CM | POA: Diagnosis not present

## 2022-01-15 DIAGNOSIS — I700 Atherosclerosis of aorta: Secondary | ICD-10-CM | POA: Diagnosis not present

## 2022-01-15 DIAGNOSIS — H6120 Impacted cerumen, unspecified ear: Secondary | ICD-10-CM | POA: Diagnosis not present

## 2022-01-15 DIAGNOSIS — R7309 Other abnormal glucose: Secondary | ICD-10-CM | POA: Diagnosis not present

## 2022-01-15 DIAGNOSIS — I10 Essential (primary) hypertension: Secondary | ICD-10-CM | POA: Diagnosis not present

## 2022-03-12 ENCOUNTER — Other Ambulatory Visit: Payer: Self-pay | Admitting: Cardiology

## 2022-03-12 DIAGNOSIS — E7800 Pure hypercholesterolemia, unspecified: Secondary | ICD-10-CM

## 2022-06-08 ENCOUNTER — Other Ambulatory Visit: Payer: Self-pay | Admitting: Cardiology

## 2022-06-08 DIAGNOSIS — E7800 Pure hypercholesterolemia, unspecified: Secondary | ICD-10-CM

## 2022-06-13 ENCOUNTER — Other Ambulatory Visit: Payer: Self-pay | Admitting: Cardiology

## 2022-06-13 DIAGNOSIS — E7800 Pure hypercholesterolemia, unspecified: Secondary | ICD-10-CM

## 2022-08-24 ENCOUNTER — Ambulatory Visit: Payer: Medicare Other | Admitting: Cardiology

## 2022-08-24 ENCOUNTER — Encounter: Payer: Self-pay | Admitting: Cardiology

## 2022-08-24 VITALS — BP 121/72 | HR 70 | Temp 97.9°F | Resp 16 | Ht 68.0 in | Wt 202.2 lb

## 2022-08-24 DIAGNOSIS — I739 Peripheral vascular disease, unspecified: Secondary | ICD-10-CM

## 2022-08-24 DIAGNOSIS — I10 Essential (primary) hypertension: Secondary | ICD-10-CM | POA: Diagnosis not present

## 2022-08-24 DIAGNOSIS — R0989 Other specified symptoms and signs involving the circulatory and respiratory systems: Secondary | ICD-10-CM

## 2022-08-24 DIAGNOSIS — E7800 Pure hypercholesterolemia, unspecified: Secondary | ICD-10-CM

## 2022-08-24 MED ORDER — EZETIMIBE 10 MG PO TABS
10.0000 mg | ORAL_TABLET | Freq: Every day | ORAL | 3 refills | Status: DC
Start: 2022-08-24 — End: 2023-08-03

## 2022-08-24 NOTE — Progress Notes (Signed)
Primary Physician/Referring:  London Pepper, MD  Patient ID: Matthew Hodges, male    DOB: 1941-06-04, 81 y.o.   MRN: [id number]  Chief Complaint  Patient presents with   PAD   Hypertension   Hyperlipidemia   Follow-up    1 year   HPI:    Matthew Hodges  is a 81 y.o. Caucasian male with hypertension, hyperlipidemia, abdominal aortic stenosis with bilateral hip claudication, prior heavy Alcohol use, chronic dyspnea on exertion, underwent successful shockwave balloon angioplasty followed by stenting of distal abdominal aorta on 01/31/2020.  His claudication symptoms are improved since angioplasty and he is walking about 2-3 miles per day.  He is presently doing well, minimal symptoms of claudication bilateral hips but overall not lifestyle limiting.  He enjoys walking on a regular basis.  He denies any chest pain, dyspnea is remained stable, no leg edema, no PND or orthopnea.  Social History   Tobacco Use   Smoking status: Former    Packs/day: 2.00    Years: 47.00    Total pack years: 94.00    Types: Cigarettes    Quit date: 11/30/2004    Years since quitting: 17.7   Smokeless tobacco: Never  Substance Use Topics   Alcohol use: No    Comment: Quit-recovering alcoholic sober since 6387  Marital Status: Married    ROS  Review of Systems  Cardiovascular:  Positive for claudication (minimal hip claudication) and dyspnea on exertion (chronic and stable). Negative for chest pain and leg swelling.   Objective  Blood pressure 121/72, pulse 70, temperature 97.9 F (36.6 C), temperature source Temporal, resp. rate 16, height '5\' 8"'$  (1.727 m), weight 202 lb 3.2 oz (91.7 kg), SpO2 95 %.     08/24/2022   10:23 AM 08/22/2021    9:58 AM 08/12/2020    2:48 PM  Vitals with BMI  Height '5\' 8"'$  '5\' 8"'$    Weight 202 lbs 3 oz 207 lbs 13 oz   BMI 56.43 32.9   Systolic 518 841 660  Diastolic 72 76 76  Pulse 70 65 74     Physical Exam Constitutional:      Appearance: He is well-developed.   Neck:     Vascular: No JVD.  Cardiovascular:     Rate and Rhythm: Normal rate and regular rhythm.     Pulses: Intact distal pulses.          Carotid pulses are  on the right side with bruit and  on the left side with bruit.    Heart sounds: Normal heart sounds. No murmur heard.    No gallop.     Comments: Normal pulses Pulmonary:     Effort: Pulmonary effort is normal.     Breath sounds: Normal breath sounds.  Abdominal:     General: Bowel sounds are normal.     Palpations: Abdomen is soft.     Comments: Obese  Musculoskeletal:     Right lower leg: No edema.     Left lower leg: No edema.    Laboratory examination:   External labs:  Cholesterol, total 148.000 m 01/15/2022 HDL 34.000 mg 01/15/2022 LDL 85.000 mg 01/15/2022 Triglycerides 165.000 m 01/15/2022  A1C 6.400 % 01/15/2022  Hemoglobin 13.800 g/d 01/15/2022  Creatinine, Serum 1.170 mg/ 01/15/2022 Potassium 5.100 mm 01/15/2022 ALT (SGPT) 19.000 U/L 01/15/2022   Cholesterol, total 118.000 m 01/02/2021 HDL 31.000 mg 01/02/2021 LDL 63.000 mg 01/02/2021 Triglycerides 138.000 m 01/02/2021  Medications and allergies   Allergies  Allergen Reactions   Losartan Other (See Comments)    Ranl failure   Latex Rash    Prolong contact  Bandages     Current Outpatient Medications:    aspirin EC 81 MG tablet, Take 1 tablet (81 mg total) by mouth daily., Disp: 90 tablet, Rfl: 3   atorvastatin (LIPITOR) 40 MG tablet, TAKE 1 TABLET(40 MG) BY MOUTH AT BEDTIME, Disp: 90 tablet, Rfl: 0   Cholecalciferol (VITAMIN D3) 400 UNITS tablet, Take 400 Units by mouth daily. , Disp: , Rfl:    Coenzyme Q10 (CO Q-10) 300 MG CAPS, Take 300 mg by mouth daily., Disp: , Rfl:    docusate sodium (COLACE) 100 MG capsule, Take 100 mg by mouth at bedtime. , Disp: , Rfl:    ezetimibe (ZETIA) 10 MG tablet, Take 1 tablet (10 mg total) by mouth daily., Disp: 90 tablet, Rfl: 3   Melatonin 5 MG TABS, Take 5 mg by mouth at bedtime. , Disp: , Rfl:    metoprolol  succinate (TOPROL-XL) 100 MG 24 hr tablet, TAKE 1 TABLET(100 MG) BY MOUTH DAILY WITH OR IMMEDIATELY FOLLOWING A MEAL, Disp: 90 tablet, Rfl: 3   mirabegron ER (MYRBETRIQ) 25 MG TB24 tablet, Take 25 mg by mouth daily. , Disp: , Rfl:    Multiple Vitamins-Minerals (MEGA MULTIVITAMIN FOR MEN PO), Take 1 tablet by mouth every morning. , Disp: , Rfl:    Omega-3 Fatty Acids (FISH OIL) 1200 MG CAPS, Take 1,200 mg by mouth daily., Disp: , Rfl:    Radiology:   CTA Abdomen 11/05/2018:  Aortic Atherosclerosis (ICD10-I70.0) and Emphysema (ICD10-J43.9). Stable 5 mm lingular nodule. Coronary atherosclerosis.  Horseshoe kidney with hypodense lesions which are likely cysts but technically too smallto characterize.    Cardiac Studies:   Lexiscan Tetrofosmin stress test 11/29/2019: No previous exam available for comparison. Lexiscan/walking nuclear stress test performed using 1-day protocol. Stress EKG is non-diagnostic, as this is pharmacological stress test. Stress EKG at 77% MPHR showed sinus tachycardia, RBBB.  SPECT imaging show small sized, mild intensity, predominantly reversible perfusion defect in basal inferior myocardium. Stress LVEF 60%. Low risk study.  Carotid artery duplex  12/06/2019:  Mild heterogeneous plaque noted in the left carotid artery.  Doppler flow velocities in the left internal carotid and common carotid  artery are consistent with stenosis in the range of 1-15% with mild heterogeneous plaque.  Antegrade right vertebral artery flow. Antegrade left vertebral artery flow.  Follow up studies appropriate if clinically indicated.  Echocardiogram 12/06/2019:  Left ventricle cavity is normal in size. Mild concentric hypertrophy of  the left ventricle. Normal LV systolic function with EF 55%. Normal global  wall motion. Doppler evidence of grade I (impaired) diastolic dysfunction, normal LAP.  Trileaflet aortic valve.  Mild to moderate aortic regurgitation.  Moderate (Grade II) mitral  regurgitation.  Mild tricuspid regurgitation. Estimated pulmonary artery systolic pressure is 20 mmHg.  Lower Extremity Arterial Duplex 12/06/2019:  No hemodynamically significant stenoses are identified in the bilateral  lower extremity arterial system.    This exam reveals mildly decreased perfusion of the right lower extremity,  noted at the post tibial artery level (ABI 0.94) and mildly decreased  perfusion of the left lower extremity, noted at the post tibial artery level (ABI 0.94).  Consider evaluation for pseudoclaudication.   Peripheral arteriogram 02/20/20  Abdominal aortogram, abdominal angiogram with limited bifemoral arteriogram. Lithotripsy of the abdominal aortic stenosis followed by  PTA and stenting with 10.0 x 39 mm Omnilink Elite balloon expandable  stent.   Angiographic data: There was diffuse atherosclerotic changes noted throughout the bilateral femoral arteries by ultrasound guidance.  There was severe calcification of the abdominal aorta.  There is mild tortuosity.  There was no abdominal aortic aneurysm.  There was a heavily calcific at least a  70% stenosis in the distal abdominal aorta.   The aortoiliac bifurcation was widely patent with mild calcification and mild disease.  Bilateral common femoral arteries were widely patent.   Bilateral renal arteries were widely patent.   Intervention: Successful lithotripsy of the calcific distal abdominal aortic stenosis followed by stenting with balloon expandable 10.0 x 39 mm Omnilink Elite balloon expandable stent which was postdilated with a 14 mm Atlas balloon.  70% stenosis reduced to less than 10%, 40 mmHg pressure gradient reduced to 0 mmHg gradient. A total of 130 mL contrast utilized.  Abdominal Aortic Duplex 08/12/2021: Patient has had multiple abdominal surgeries and states he is "absolutely full of mesh repairs". Patient images are extremely difficult. I do not feel confident with respect to any anatomical  identifications in his abdominal region. Compared to 08/09/2020, 4.2 x 4.2 x 4.48 cm proximal and mid abdominal attic aneurysm could not be confirmed.  Distal abdominal aorta appears to be atretic with minimal flow.  Bilateral iliac artery could not be visualized.  Consider different modality imaging.  ABI 08/12/2021: This exam reveals mildly decreased perfusion of the right lower extremity, noted at the dorsalis pedis and post tibial artery level (ABI 0.93) and mildly decreased perfusion of the left lower extremity, noted at the post tibial artery level (ABI 0.96).  Study done on 12/21/2019, no significant change in ABI.   EKG:  EKG 08/24/2022: Normal sinus rhythm at a rate of 70 bpm, left axis deviation, left anterior fascicular block.  Incomplete right bundle branch block.  No evidence of ischemia. No significant change from prior EKG 08/22/2021. .  Assessment     ICD-10-CM   1. Peripheral artery disease (HCC)  I73.9 EKG 12-Lead    Lipoprotein A (LPA)    Lipid Panel With LDL/HDL Ratio    2. Essential hypertension  I10     3. Hypercholesteremia  E78.00 ezetimibe (ZETIA) 10 MG tablet    Lipoprotein A (LPA)    Lipid Panel With LDL/HDL Ratio    4. Bilateral carotid bruits  R09.89 PCV CAROTID DUPLEX (BILATERAL)       Meds ordered this encounter  Medications   ezetimibe (ZETIA) 10 MG tablet    Sig: Take 1 tablet (10 mg total) by mouth daily.    Dispense:  90 tablet    Refill:  3    Recommendations:    Matthew Hodges  is a 81 y.o. Caucasian male with hypertension, hyperlipidemia, abdominal aortic stenosis with bilateral hip claudication, prior heavy Alcohol use, chronic dyspnea on exertion, underwent successful shockwave balloon angioplasty followed by stenting of distal abdominal aorta on 01/31/2020.  His claudication symptoms are improved since angioplasty and he is walking about 2-3 miles per day.  He is presently doing well, minimal symptoms of claudication bilateral hips but  overall not lifestyle limiting.  He enjoys walking on a regular basis.  Physical examination reveals excellent pedal pulses, popliteal pulses and femoral pulses.  Continue present management.  Dyspnea has remained stable.  No PND or orthopnea.  No clinical evidence of heart failure.  Reviewed his external labs, LDL has risen.  I have added Zetia 10 mg daily.  Will obtain LPA and also lipid profile  testing in 1 month to 6 weeks.  Bilateral carotid artery bruit is very prominent, previously noted to only have left carotid bruit.  Will repeat carotid duplex.  Otherwise blood pressure is well controlled, no other changes were done, I will see him back on an annual basis.   Adrian Prows, MD, [facility] 08/24/2022, 11:20 AM Office: [phone]

## 2022-09-29 DIAGNOSIS — I739 Peripheral vascular disease, unspecified: Secondary | ICD-10-CM | POA: Diagnosis not present

## 2022-09-29 DIAGNOSIS — E7800 Pure hypercholesterolemia, unspecified: Secondary | ICD-10-CM | POA: Diagnosis not present

## 2022-09-30 LAB — LIPID PANEL WITH LDL/HDL RATIO
Cholesterol, Total: 128 mg/dL (ref 100–199)
HDL: 38 mg/dL — ABNORMAL LOW (ref >39)
LDL Chol Calc (NIH): 63 mg/dL (ref 0–99)
LDL/HDL Ratio: 1.7 ratio (ref 0.0–3.6)
Triglycerides: 157 mg/dL — ABNORMAL HIGH (ref 0–149)
VLDL Cholesterol Cal: 27 mg/dL (ref 5–40)

## 2022-09-30 LAB — LIPOPROTEIN A (LPA): Lipoprotein (a): 90.3 nmol/L — ABNORMAL HIGH (ref <75.0)

## 2022-10-30 ENCOUNTER — Other Ambulatory Visit: Payer: Self-pay | Admitting: Cardiology

## 2022-10-30 DIAGNOSIS — E7800 Pure hypercholesterolemia, unspecified: Secondary | ICD-10-CM

## 2022-11-11 ENCOUNTER — Other Ambulatory Visit: Payer: Self-pay | Admitting: Cardiology

## 2022-11-11 DIAGNOSIS — I10 Essential (primary) hypertension: Secondary | ICD-10-CM

## 2023-01-08 DIAGNOSIS — H9319 Tinnitus, unspecified ear: Secondary | ICD-10-CM | POA: Diagnosis not present

## 2023-01-08 DIAGNOSIS — H9190 Unspecified hearing loss, unspecified ear: Secondary | ICD-10-CM | POA: Diagnosis not present

## 2023-02-04 DIAGNOSIS — I10 Essential (primary) hypertension: Secondary | ICD-10-CM | POA: Diagnosis not present

## 2023-02-04 DIAGNOSIS — E7800 Pure hypercholesterolemia, unspecified: Secondary | ICD-10-CM | POA: Diagnosis not present

## 2023-02-04 DIAGNOSIS — R7303 Prediabetes: Secondary | ICD-10-CM | POA: Diagnosis not present

## 2023-02-04 DIAGNOSIS — I700 Atherosclerosis of aorta: Secondary | ICD-10-CM | POA: Diagnosis not present

## 2023-02-04 DIAGNOSIS — Z0000 Encounter for general adult medical examination without abnormal findings: Secondary | ICD-10-CM | POA: Diagnosis not present

## 2023-02-05 ENCOUNTER — Other Ambulatory Visit: Payer: Self-pay | Admitting: Cardiology

## 2023-02-05 DIAGNOSIS — E7800 Pure hypercholesterolemia, unspecified: Secondary | ICD-10-CM

## 2023-02-10 DIAGNOSIS — Z0000 Encounter for general adult medical examination without abnormal findings: Secondary | ICD-10-CM | POA: Diagnosis not present

## 2023-02-10 DIAGNOSIS — E7800 Pure hypercholesterolemia, unspecified: Secondary | ICD-10-CM | POA: Diagnosis not present

## 2023-02-10 DIAGNOSIS — R7303 Prediabetes: Secondary | ICD-10-CM | POA: Diagnosis not present

## 2023-03-22 DIAGNOSIS — H9313 Tinnitus, bilateral: Secondary | ICD-10-CM | POA: Diagnosis not present

## 2023-03-22 DIAGNOSIS — H6121 Impacted cerumen, right ear: Secondary | ICD-10-CM | POA: Diagnosis not present

## 2023-03-22 DIAGNOSIS — H903 Sensorineural hearing loss, bilateral: Secondary | ICD-10-CM | POA: Diagnosis not present

## 2023-03-25 DIAGNOSIS — H903 Sensorineural hearing loss, bilateral: Secondary | ICD-10-CM | POA: Diagnosis not present

## 2023-04-03 DIAGNOSIS — R1032 Left lower quadrant pain: Secondary | ICD-10-CM | POA: Diagnosis not present

## 2023-04-03 DIAGNOSIS — R1031 Right lower quadrant pain: Secondary | ICD-10-CM | POA: Diagnosis not present

## 2023-04-28 DIAGNOSIS — H6991 Unspecified Eustachian tube disorder, right ear: Secondary | ICD-10-CM | POA: Diagnosis not present

## 2023-05-11 DIAGNOSIS — R2231 Localized swelling, mass and lump, right upper limb: Secondary | ICD-10-CM | POA: Diagnosis not present

## 2023-05-27 DIAGNOSIS — C44622 Squamous cell carcinoma of skin of right upper limb, including shoulder: Secondary | ICD-10-CM | POA: Diagnosis not present

## 2023-05-27 DIAGNOSIS — L821 Other seborrheic keratosis: Secondary | ICD-10-CM | POA: Diagnosis not present

## 2023-05-27 DIAGNOSIS — D492 Neoplasm of unspecified behavior of bone, soft tissue, and skin: Secondary | ICD-10-CM | POA: Diagnosis not present

## 2023-05-27 DIAGNOSIS — L570 Actinic keratosis: Secondary | ICD-10-CM | POA: Diagnosis not present

## 2023-06-21 DIAGNOSIS — C44622 Squamous cell carcinoma of skin of right upper limb, including shoulder: Secondary | ICD-10-CM | POA: Diagnosis not present

## 2023-06-29 DIAGNOSIS — L853 Xerosis cutis: Secondary | ICD-10-CM | POA: Diagnosis not present

## 2023-06-29 DIAGNOSIS — L570 Actinic keratosis: Secondary | ICD-10-CM | POA: Diagnosis not present

## 2023-06-29 DIAGNOSIS — D225 Melanocytic nevi of trunk: Secondary | ICD-10-CM | POA: Diagnosis not present

## 2023-06-29 DIAGNOSIS — L821 Other seborrheic keratosis: Secondary | ICD-10-CM | POA: Diagnosis not present

## 2023-06-29 DIAGNOSIS — L814 Other melanin hyperpigmentation: Secondary | ICD-10-CM | POA: Diagnosis not present

## 2023-06-29 DIAGNOSIS — D1801 Hemangioma of skin and subcutaneous tissue: Secondary | ICD-10-CM | POA: Diagnosis not present

## 2023-06-29 DIAGNOSIS — Z08 Encounter for follow-up examination after completed treatment for malignant neoplasm: Secondary | ICD-10-CM | POA: Diagnosis not present

## 2023-06-29 DIAGNOSIS — Z85828 Personal history of other malignant neoplasm of skin: Secondary | ICD-10-CM | POA: Diagnosis not present

## 2023-06-29 DIAGNOSIS — Z7189 Other specified counseling: Secondary | ICD-10-CM | POA: Diagnosis not present

## 2023-07-26 ENCOUNTER — Ambulatory Visit: Payer: Medicare Other

## 2023-07-26 DIAGNOSIS — R0989 Other specified symptoms and signs involving the circulatory and respiratory systems: Secondary | ICD-10-CM

## 2023-08-02 ENCOUNTER — Other Ambulatory Visit: Payer: Self-pay | Admitting: Cardiology

## 2023-08-02 DIAGNOSIS — E7800 Pure hypercholesterolemia, unspecified: Secondary | ICD-10-CM

## 2023-08-05 ENCOUNTER — Other Ambulatory Visit: Payer: Self-pay | Admitting: Cardiology

## 2023-08-05 DIAGNOSIS — I10 Essential (primary) hypertension: Secondary | ICD-10-CM

## 2023-08-11 DIAGNOSIS — E119 Type 2 diabetes mellitus without complications: Secondary | ICD-10-CM | POA: Diagnosis not present

## 2023-08-11 DIAGNOSIS — Z23 Encounter for immunization: Secondary | ICD-10-CM | POA: Diagnosis not present

## 2023-08-11 DIAGNOSIS — I10 Essential (primary) hypertension: Secondary | ICD-10-CM | POA: Diagnosis not present

## 2023-08-11 DIAGNOSIS — E7800 Pure hypercholesterolemia, unspecified: Secondary | ICD-10-CM | POA: Diagnosis not present

## 2023-08-25 ENCOUNTER — Ambulatory Visit: Payer: Medicare Other | Admitting: Cardiology

## 2023-09-29 DIAGNOSIS — S01301A Unspecified open wound of right ear, initial encounter: Secondary | ICD-10-CM | POA: Diagnosis not present

## 2023-09-29 DIAGNOSIS — Z09 Encounter for follow-up examination after completed treatment for conditions other than malignant neoplasm: Secondary | ICD-10-CM | POA: Diagnosis not present

## 2023-09-29 DIAGNOSIS — L570 Actinic keratosis: Secondary | ICD-10-CM | POA: Diagnosis not present

## 2023-10-30 ENCOUNTER — Other Ambulatory Visit: Payer: Self-pay | Admitting: Cardiology

## 2023-10-30 DIAGNOSIS — E7800 Pure hypercholesterolemia, unspecified: Secondary | ICD-10-CM

## 2023-11-30 DIAGNOSIS — C44629 Squamous cell carcinoma of skin of left upper limb, including shoulder: Secondary | ICD-10-CM | POA: Diagnosis not present

## 2023-11-30 DIAGNOSIS — D492 Neoplasm of unspecified behavior of bone, soft tissue, and skin: Secondary | ICD-10-CM | POA: Diagnosis not present

## 2023-12-22 DIAGNOSIS — C44629 Squamous cell carcinoma of skin of left upper limb, including shoulder: Secondary | ICD-10-CM | POA: Diagnosis not present

## 2023-12-30 DIAGNOSIS — Z08 Encounter for follow-up examination after completed treatment for malignant neoplasm: Secondary | ICD-10-CM | POA: Diagnosis not present

## 2023-12-30 DIAGNOSIS — D485 Neoplasm of uncertain behavior of skin: Secondary | ICD-10-CM | POA: Diagnosis not present

## 2023-12-30 DIAGNOSIS — Z85828 Personal history of other malignant neoplasm of skin: Secondary | ICD-10-CM | POA: Diagnosis not present

## 2023-12-30 DIAGNOSIS — L570 Actinic keratosis: Secondary | ICD-10-CM | POA: Diagnosis not present

## 2023-12-30 DIAGNOSIS — Z7189 Other specified counseling: Secondary | ICD-10-CM | POA: Diagnosis not present

## 2023-12-30 DIAGNOSIS — L814 Other melanin hyperpigmentation: Secondary | ICD-10-CM | POA: Diagnosis not present

## 2023-12-30 DIAGNOSIS — D229 Melanocytic nevi, unspecified: Secondary | ICD-10-CM | POA: Diagnosis not present

## 2023-12-30 DIAGNOSIS — D1801 Hemangioma of skin and subcutaneous tissue: Secondary | ICD-10-CM | POA: Diagnosis not present

## 2023-12-30 DIAGNOSIS — L821 Other seborrheic keratosis: Secondary | ICD-10-CM | POA: Diagnosis not present

## 2024-01-01 DEATH — deceased

## 2024-02-08 DIAGNOSIS — I10 Essential (primary) hypertension: Secondary | ICD-10-CM | POA: Diagnosis not present

## 2024-02-08 DIAGNOSIS — E7800 Pure hypercholesterolemia, unspecified: Secondary | ICD-10-CM | POA: Diagnosis not present

## 2024-02-08 DIAGNOSIS — I700 Atherosclerosis of aorta: Secondary | ICD-10-CM | POA: Diagnosis not present

## 2024-02-08 DIAGNOSIS — M79671 Pain in right foot: Secondary | ICD-10-CM | POA: Diagnosis not present

## 2024-02-08 DIAGNOSIS — E119 Type 2 diabetes mellitus without complications: Secondary | ICD-10-CM | POA: Diagnosis not present

## 2024-02-08 DIAGNOSIS — Z0000 Encounter for general adult medical examination without abnormal findings: Secondary | ICD-10-CM | POA: Diagnosis not present

## 2024-05-01 ENCOUNTER — Other Ambulatory Visit: Payer: Self-pay | Admitting: Cardiology

## 2024-05-01 DIAGNOSIS — E7800 Pure hypercholesterolemia, unspecified: Secondary | ICD-10-CM

## 2024-06-20 DIAGNOSIS — L814 Other melanin hyperpigmentation: Secondary | ICD-10-CM | POA: Diagnosis not present

## 2024-06-20 DIAGNOSIS — C44311 Basal cell carcinoma of skin of nose: Secondary | ICD-10-CM | POA: Diagnosis not present

## 2024-06-20 DIAGNOSIS — L570 Actinic keratosis: Secondary | ICD-10-CM | POA: Diagnosis not present

## 2024-06-20 DIAGNOSIS — L821 Other seborrheic keratosis: Secondary | ICD-10-CM | POA: Diagnosis not present

## 2024-06-20 DIAGNOSIS — C4442 Squamous cell carcinoma of skin of scalp and neck: Secondary | ICD-10-CM | POA: Diagnosis not present

## 2024-06-20 DIAGNOSIS — D225 Melanocytic nevi of trunk: Secondary | ICD-10-CM | POA: Diagnosis not present

## 2024-06-29 DIAGNOSIS — E7800 Pure hypercholesterolemia, unspecified: Secondary | ICD-10-CM | POA: Diagnosis not present

## 2024-06-29 DIAGNOSIS — E119 Type 2 diabetes mellitus without complications: Secondary | ICD-10-CM | POA: Diagnosis not present

## 2024-06-29 DIAGNOSIS — I10 Essential (primary) hypertension: Secondary | ICD-10-CM | POA: Diagnosis not present

## 2024-07-27 ENCOUNTER — Ambulatory Visit: Attending: Cardiology | Admitting: Cardiology

## 2024-07-27 ENCOUNTER — Other Ambulatory Visit (HOSPITAL_COMMUNITY): Payer: Self-pay

## 2024-07-27 ENCOUNTER — Encounter: Payer: Self-pay | Admitting: Cardiology

## 2024-07-27 VITALS — BP 151/69 | HR 64 | Resp 16 | Ht 68.0 in | Wt 200.0 lb

## 2024-07-27 DIAGNOSIS — I739 Peripheral vascular disease, unspecified: Secondary | ICD-10-CM

## 2024-07-27 DIAGNOSIS — I491 Atrial premature depolarization: Secondary | ICD-10-CM | POA: Diagnosis not present

## 2024-07-27 DIAGNOSIS — E7800 Pure hypercholesterolemia, unspecified: Secondary | ICD-10-CM

## 2024-07-27 DIAGNOSIS — I10 Essential (primary) hypertension: Secondary | ICD-10-CM | POA: Diagnosis not present

## 2024-07-27 DIAGNOSIS — E119 Type 2 diabetes mellitus without complications: Secondary | ICD-10-CM | POA: Diagnosis not present

## 2024-07-27 MED ORDER — AMLODIPINE BESYLATE 5 MG PO TABS
5.0000 mg | ORAL_TABLET | Freq: Every day | ORAL | 0 refills | Status: AC
  Filled 2024-07-27: qty 30, 30d supply, fill #0

## 2024-07-27 NOTE — Patient Instructions (Signed)
 Medication Instructions:  Your physician has recommended you make the following change in your medication:  Start amlodipine  5 mg by mouth daily  Decrease Metoprolol  Succinate to 50 mg by mouth daily   *If you need a refill on your cardiac medications before your next appointment, please call your pharmacy*  Lab Work: none If you have labs (blood work) drawn today and your tests are completely normal, you will receive your results only by: MyChart Message (if you have MyChart) OR A paper copy in the mail If you have any lab test that is abnormal or we need to change your treatment, we will call you to review the results.  Testing/Procedures: none  Follow-Up: At [facility], you and your health needs are our priority.  As part of our continuing mission to provide you with exceptional heart care, our providers are all part of one team.  This team includes your primary Cardiologist (physician) and Advanced Practice Providers or APPs (Physician Assistants and Nurse Practitioners) who all work together to provide you with the care you need, when you need it.  Your next appointment:   As needed  Provider:   Gordy Bergamo, MD    We recommend signing up for the patient portal called MyChart.  Sign up information is provided on this After Visit Summary.  MyChart is used to connect with patients for Virtual Visits (Telemedicine).  Patients are able to view lab/test results, encounter notes, upcoming appointments, etc.  Non-urgent messages can be sent to your provider as well.   To learn more about what you can do with MyChart, go to [email or web address].   Other Instructions

## 2024-07-27 NOTE — Progress Notes (Signed)
 Cardiology Office Note:  .   Date:  07/30/2024  ID:  Matthew Hodges, DOB November 21, 1941, MRN [id number] PCP: Kip Righter, MD  [facility] Providers Cardiologist:  Gordy Bergamo, MD   History of Present Illness: .   Matthew Hodges is a 83 y.o.  Caucasian male with hypertension, hyperlipidemia, abdominal aortic stenosis with bilateral hip claudication, prior heavy Alcohol use, chronic dyspnea on exertion, underwent successful shockwave balloon angioplasty followed by stenting of distal abdominal aorta on 01/31/2020.   At last seen him in 2023.  Patient is essentially asymptomatic and has been exercising and walking on a regular basis.  He has chronic mild dyspnea which has not changed.  He has had a low risk Lexiscan  nuclear stress test on 11/29/2019 with mild small inferior ischemia but with normal LVEF, normal LVEF echocardiogram on 12/06/2019 with mild to moderate aortic regurgitation and moderate mitral regurgitation and carotid duplex no significant disease.  Cardiac Studies relevent.    Carotid duplex 12/05/2022: No significant carotid artery disease mild 1-15% stenosis with heterogenous plaque.  Echocardiogram 12/06/2019: Normal LVEF at 55%, mild LVH. Mild to moderate AI Moderate mitral regurgitation.  Peripheral arteriogram 02/20/2020: PTA with shockwave lithotripsy followed by stenting with a 10.0 x 39 mm Omnilink balloon expandable stent to the distal abdominal aorta.    Discussed the use of AI scribe software for clinical note transcription with the patient, who gave verbal consent to proceed.  History of Present Illness Matthew Hodges is an 83 year old male with diabetes and coronary artery disease who presents for cardiovascular follow-up.  He experiences shortness of breath when walking uphill and increased fatigue. There is no chest pain or leg cramping during walking. The shortness of breath is consistent with previous episodes.  He has coronary artery disease  and previously underwent stent placement, which he feels has been beneficial. He is taking metoprolol  succinate 100 mg once daily, with a recent prescription refill.  He acknowledges weight gain and currently weighs 200 pounds. He does not monitor his blood pressure at home but recalls it being higher in the past.  No constipation or leg swelling.   Labs   Care everywhere/Faxed External Labs:  Labs 08/25/2023:  Total cholesterol 106, triglycerides 149, HDL 44, LDL 57.  Labs 02/15/2024:  BUN 21, creatinine 1.23, eGFR 59 mL, potassium 5.1, LFTs normal.  Hb 14.2/HCT 42.0, platelets of 216.  A1c 6.4%.  ROS  Review of Systems  Cardiovascular:  Positive for dyspnea on exertion (stable). Negative for chest pain and leg swelling.   Physical Exam:   VS:  BP (!) 151/69 (BP Location: Left Arm, Patient Position: Sitting, Cuff Size: Large)   Pulse 64   Resp 16   Ht 5' 8 (1.727 m)   Wt 200 lb (90.7 kg)   SpO2 98%   BMI 30.41 kg/m    Wt Readings from Last 3 Encounters:  07/27/24 200 lb (90.7 kg)  08/24/22 202 lb 3.2 oz (91.7 kg)  08/22/21 207 lb 12.8 oz (94.3 kg)    BP Readings from Last 3 Encounters:  07/27/24 (!) 151/69  08/24/22 121/72  08/22/21 127/76   Physical Exam Neck:     Vascular: Carotid bruit (bilateral) present. No JVD.  Cardiovascular:     Rate and Rhythm: Normal rate and regular rhythm.     Pulses: Intact distal pulses.     Heart sounds: Normal heart sounds. No murmur heard.    No gallop.  Pulmonary:  Effort: Pulmonary effort is normal.     Breath sounds: Normal breath sounds.  Abdominal:     General: Bowel sounds are normal.     Palpations: Abdomen is soft.  Musculoskeletal:     Right lower leg: No edema.     Left lower leg: No edema.    EKG:    EKG Interpretation Date/Time:  Thursday July 27 2024 09:48:16 EDT Ventricular Rate:  63 PR Interval:  162 QRS Duration:  118 QT Interval:  424 QTC Calculation: 433 R Axis:   -40  Text  Interpretation: EKG 07/27/2024: Possible ectopic atrial rhythm at the rate of 63 bpm, left anterior fascicular block.  Incomplete right bundle branch block.  No evidence of ischemia.  Compared to 08/24/2022, ectopic atrial rhythm new but otherwise no change. Confirmed by Dhwani Venkatesh, Jagadeesh [id number]) on 07/27/2024 9:58:54 AM  EKG 08/24/2022: Normal sinus rhythm at a rate of 70 bpm, left axis deviation, left anterior fascicular block. Incomplete right bundle branch block.   ASSESSMENT AND PLAN: .      ICD-10-CM   1. Peripheral artery disease (HCC)  I73.9 EKG 12-Lead    2. Hypercholesteremia  E78.00     3. Primary hypertension  I10 metoprolol  succinate (TOPROL -XL) 100 MG 24 hr tablet    4. Type 2 diabetes mellitus without complication, without long-term current use of insulin  (HCC)  E11.9     5. Essential hypertension  I10 metoprolol  succinate (TOPROL -XL) 100 MG 24 hr tablet    6. Ectopic atrial rhythm  I49.1      Assessment & Plan Essential hypertension Blood pressure is elevated. Current regimen includes metoprolol  succinate 100 mg daily. - Reduce metoprolol  succinate to 50 mg daily to prevent sinus arrest due to ectopic atrial rhythm. - Prescribe amlodipine  to further manage blood pressure. - Discuss potential side effects of amlodipine , including constipation and leg swelling.  Type 2 diabetes mellitus A1c fluctuates between 6.4% and 6.5%. - Discuss with Dr. [employer] the possibility of starting Ozempic or Mounjaro for diabetes management and weight loss. - Encourage weight loss to aid in diabetes management and cardiovascular protection.  Atrial premature depolarization Ectopic atrial rhythm present.  Given his age of 1 years, sinus node dysfunction is a possibility hence we will avoid high-dose beta-blocker therapy - Reduce metoprolol  succinate to 50 mg daily. - Added amlodipine  5 mg daily.  Peripheral vascular disease No leg cramping while walking, indicating well-managed  condition. - Encourage regular walking to maintain vascular health.   Follow up: PRN, will request PCP to take over his prescriptions as well.  Signed,  Gordy Bergamo, MD, [facility] 07/30/2024, 1:30 PM [facility] [address] [place], [place] [address] Phone: [phone]. Fax:  [phone]

## 2024-07-30 DIAGNOSIS — I10 Essential (primary) hypertension: Secondary | ICD-10-CM | POA: Diagnosis not present

## 2024-07-30 DIAGNOSIS — E7800 Pure hypercholesterolemia, unspecified: Secondary | ICD-10-CM | POA: Diagnosis not present

## 2024-07-30 DIAGNOSIS — E119 Type 2 diabetes mellitus without complications: Secondary | ICD-10-CM | POA: Diagnosis not present

## 2024-08-02 ENCOUNTER — Other Ambulatory Visit: Payer: Self-pay | Admitting: Cardiology

## 2024-08-02 DIAGNOSIS — E7800 Pure hypercholesterolemia, unspecified: Secondary | ICD-10-CM

## 2024-08-09 DIAGNOSIS — C4442 Squamous cell carcinoma of skin of scalp and neck: Secondary | ICD-10-CM | POA: Diagnosis not present

## 2024-08-10 DIAGNOSIS — K117 Disturbances of salivary secretion: Secondary | ICD-10-CM | POA: Diagnosis not present

## 2024-08-10 DIAGNOSIS — E7800 Pure hypercholesterolemia, unspecified: Secondary | ICD-10-CM | POA: Diagnosis not present

## 2024-08-10 DIAGNOSIS — I10 Essential (primary) hypertension: Secondary | ICD-10-CM | POA: Diagnosis not present

## 2024-08-10 DIAGNOSIS — I739 Peripheral vascular disease, unspecified: Secondary | ICD-10-CM | POA: Diagnosis not present

## 2024-08-10 DIAGNOSIS — E119 Type 2 diabetes mellitus without complications: Secondary | ICD-10-CM | POA: Diagnosis not present

## 2024-08-10 DIAGNOSIS — Z23 Encounter for immunization: Secondary | ICD-10-CM | POA: Diagnosis not present

## 2024-08-10 DIAGNOSIS — K648 Other hemorrhoids: Secondary | ICD-10-CM | POA: Diagnosis not present

## 2024-08-11 ENCOUNTER — Other Ambulatory Visit: Payer: Self-pay | Admitting: Cardiology

## 2024-08-11 DIAGNOSIS — E7800 Pure hypercholesterolemia, unspecified: Secondary | ICD-10-CM

## 2024-08-14 ENCOUNTER — Other Ambulatory Visit: Payer: Self-pay | Admitting: Cardiology

## 2024-08-14 DIAGNOSIS — I10 Essential (primary) hypertension: Secondary | ICD-10-CM

## 2024-08-25 ENCOUNTER — Other Ambulatory Visit (HOSPITAL_COMMUNITY): Payer: Self-pay

## 2024-08-29 DIAGNOSIS — E7800 Pure hypercholesterolemia, unspecified: Secondary | ICD-10-CM | POA: Diagnosis not present

## 2024-08-29 DIAGNOSIS — E119 Type 2 diabetes mellitus without complications: Secondary | ICD-10-CM | POA: Diagnosis not present

## 2024-08-29 DIAGNOSIS — I10 Essential (primary) hypertension: Secondary | ICD-10-CM | POA: Diagnosis not present

## 2024-10-25 ENCOUNTER — Other Ambulatory Visit: Payer: Self-pay | Admitting: Cardiology

## 2024-10-25 DIAGNOSIS — E7800 Pure hypercholesterolemia, unspecified: Secondary | ICD-10-CM

## 2024-10-30 DEATH — deceased

## 2024-11-06 ENCOUNTER — Other Ambulatory Visit: Payer: Self-pay | Admitting: Cardiology

## 2024-11-06 DIAGNOSIS — E7800 Pure hypercholesterolemia, unspecified: Secondary | ICD-10-CM

## 2024-11-07 ENCOUNTER — Telehealth: Payer: Self-pay | Admitting: Cardiology

## 2024-11-07 ENCOUNTER — Other Ambulatory Visit: Payer: Self-pay

## 2024-11-07 DIAGNOSIS — E7800 Pure hypercholesterolemia, unspecified: Secondary | ICD-10-CM

## 2024-11-07 MED ORDER — ATORVASTATIN CALCIUM 40 MG PO TABS: 40.0000 mg | ORAL_TABLET | Freq: Every day | ORAL | 0 refills | Status: AC

## 2024-11-07 NOTE — Telephone Encounter (Signed)
 Patient needs his atorvastatin  refilled, he is going to see his PCP next week but would like a refill before then.

## 2024-11-09 NOTE — Telephone Encounter (Signed)
 Done
